# Patient Record
Sex: Male | Born: 1935
Health system: Southern US, Community
[De-identification: ages and names within clinical notes are randomized; demographics above are authoritative.]

## PROBLEM LIST (undated history)

## (undated) DIAGNOSIS — G20 Parkinson's disease: Secondary | ICD-10-CM

## (undated) HISTORY — PX: OTHER SURGICAL HISTORY: SHX169

## (undated) HISTORY — PX: THYROID SURGERY: SHX805

## (undated) HISTORY — PX: HERNIA REPAIR: SHX51

## (undated) HISTORY — DX: Parkinson's disease: G20

---

## 2000-09-25 ENCOUNTER — Ambulatory Visit (HOSPITAL_COMMUNITY): Admission: RE | Admit: 2000-09-25 | Discharge: 2000-09-25 | Payer: Self-pay | Admitting: Surgery

## 2001-12-25 ENCOUNTER — Emergency Department (HOSPITAL_COMMUNITY): Admission: EM | Admit: 2001-12-25 | Discharge: 2001-12-25 | Payer: Self-pay | Admitting: Emergency Medicine

## 2001-12-25 ENCOUNTER — Encounter: Payer: Self-pay | Admitting: Emergency Medicine

## 2003-09-26 ENCOUNTER — Ambulatory Visit (HOSPITAL_COMMUNITY): Admission: RE | Admit: 2003-09-26 | Discharge: 2003-09-26 | Payer: Self-pay | Admitting: Gastroenterology

## 2005-03-08 ENCOUNTER — Encounter: Admission: RE | Admit: 2005-03-08 | Discharge: 2005-03-08 | Payer: Self-pay | Admitting: Internal Medicine

## 2010-05-03 ENCOUNTER — Ambulatory Visit (HOSPITAL_COMMUNITY)
Admission: RE | Admit: 2010-05-03 | Discharge: 2010-05-03 | Payer: Self-pay | Source: Home / Self Care | Attending: Surgery | Admitting: Surgery

## 2010-07-23 LAB — CBC
HCT: 45 % (ref 39.0–52.0)
Hemoglobin: 15.2 g/dL (ref 13.0–17.0)
MCH: 31 pg (ref 26.0–34.0)
MCHC: 33.8 g/dL (ref 30.0–36.0)
MCV: 91.8 fL (ref 78.0–100.0)
Platelets: 202 10*3/uL (ref 150–400)
RBC: 4.9 MIL/uL (ref 4.22–5.81)
RDW: 13.4 % (ref 11.5–15.5)
WBC: 6.2 10*3/uL (ref 4.0–10.5)

## 2010-07-23 LAB — SURGICAL PCR SCREEN
MRSA, PCR: NEGATIVE
Staphylococcus aureus: NEGATIVE

## 2010-09-28 NOTE — Op Note (Signed)
NAME:  JUDITH, CAMPILLO NO.:  [id number]   MEDICAL RECORD NO.:  [id number]                   PATIENT TYPE:  AMB   LOCATION:  ENDO                                 FACILITY:  [facility]   PHYSICIAN:  Anselmo Rod, M.D.               DATE OF BIRTH:  [birth year]   DATE OF PROCEDURE:  09/26/2003  DATE OF DISCHARGE:                                 OPERATIVE REPORT   PROCEDURE PERFORMED:  Screening colonoscopy.   ENDOSCOPIST:  Charna Elizabeth, M.D.   INSTRUMENT USED:  Olympus video colonoscope.   INDICATIONS FOR PROCEDURE:  The patient is a 75 year old white male  undergoing screening colonoscopy to rule out colonic polyps, masses, etc.   PREPROCEDURE PREPARATION:  Informed consent was procured from the patient.  The patient was fasted for eight hours prior to the procedure and prepped  with a bottle of magnesium citrate and a gallon of GoLYTELY the night prior  to the procedure.   PREPROCEDURE PHYSICAL:  The patient had stable vital signs.  Neck supple.  Chest clear to auscultation.  S1 and S2 regular.  Abdomen soft with normal  bowel sounds.   DESCRIPTION OF PROCEDURE:  The patient was placed in left lateral decubitus  position and sedated with 75 mg of Demerol and 7.5 mg of Versed  intravenously.  Once the patient was adequately sedated and maintained on  low flow oxygen and continuous cardiac monitoring, the Olympus video  colonoscope was advanced from the rectum to the cecum.  There were few  sigmoid diverticula seen.  There were small internal hemorrhoids appreciated  on retroflexion in the rectum.  There was some residual stool in the right  colon.  Multiple washes were done.  The patient's position was changed from  the left lateral to the supine position with gentle application of abdominal  pressure to reach the cecum.  No masses or polyps were identified.  Small  lesions could have been missed secondary to a relatively inadequate prep;  however, I  made a good attempt to wash all the stool off to facilitate  adequate visualization.   IMPRESSION:  1. Small nonbleeding internal hemorrhoids.  2. Few early sigmoid diverticula.  3. Otherwise unrevealing colonoscopy.  Some residual stool in the right     colon, small lesions could have been missed.  r   RECOMMENDATIONS:  1. Continue a high fiber diet with liberal fluid intake.  2. Brochures on diverticulosis have been given to the patient for his     education.  3. Repeat colorectal cancer screening is recommended in the next 5 years     considering the fact that he had some residual stool in the colon at this     time.  Further recommendation will be made in follow-up.  Anselmo Rod, M.D.    JNM/MEDQ  D:  09/26/2003  T:  09/26/2003  Job:  [id number]   cc:   Olene Craven, M.D.  [address]  [address]  [place]  [place] [address]  Fax: [phone]

## 2010-09-28 NOTE — Op Note (Signed)
[facility]  Patient:    Lee Martinez, Lee Martinez                         MRN: [id number] Proc. Date: 09/25/00 Adm. Date:  [id number] Attending:  Abigail Miyamoto A                           Operative Report  PREOPERATIVE DIAGNOSIS:  Left inguinal hernia.  POSTOPERATIVE DIAGNOSIS:  Left inguinal hernia.  PROCEDURE:  Laparoscopic left inguinal hernia repair with mesh.  SURGEON:  Abigail Miyamoto, M.D.  ANESTHESIA:  General endotracheal.  ESTIMATED BLOOD LOSS:  Minimal.  DESCRIPTION OF PROCEDURE:  The patient was brought to the operating room and identified as Lee Martinez.  He was placed supine on the operating table, and general anesthesia was induced.  His abdomen was then prepped and draped in the usual sterile fashion.  Using the #15 blade, a small, transverse incision was made below the umbilicus.  Incision was carried down to the fascia.  The fascia was then opened just off the midline with the scalpel.  The rectus muscles were identified and elevated.  The dissecting balloon was then passed underneath the rectus muscles down toward the pubis.  The dissecting balloon was then insufflated, dissecting out the preperitoneal space.  Next, the dissecting balloon was removed, and the smaller balloon origin port was placed through the opening.  Insufflation of the preperitoneal space was then initiated.  Next two 5 mm ports were placed in the patient midline under direct vision.  Dissection was then carried out in the left inguinal area. The testicle, cord, and structures were easily identified as well as the indirect hernia sac which was easily reduced from the cords.  Coopers ligament was easily identified.  Next, a piece of 6 x 6 Prolene mesh was brought onto the field and fashioned appropriately.  The mesh was placed through the umbilical port and then opened up into the preperitoneal space and placed in the left inguinal floor as an onlay technique, covering  the cord and its structures.  The mesh was then tacked in place to Coopers ligament and up the medial abdominal wall and out laterally.  The mesh appeared to cover the defect well.  At this point, all points were removed, and the preperitoneal space was deflated.  Again, the mesh appeared to lay in place well as the space was collapsed.  Next, the fascia at the umbilical incision was closed with a figure-of-eight 0 Vicryl suture.  All incisions were then anesthetized with 0.25% Marcaine and closed with 4-0 Monocryl subcuticular sutures. Steri-Strips, gauze, and tape were then applied.  The patient tolerated the procedure well.  All sponge, needle and instrument counts were correct at the end of the procedure.  The patient was then extubated in the operating room and taken in stable condition to the recovery room. DD:  09/25/00 TD:  09/25/00 Job: [id number]

## 2011-07-12 DIAGNOSIS — G20A1 Parkinson's disease without dyskinesia, without mention of fluctuations: Secondary | ICD-10-CM

## 2011-07-12 DIAGNOSIS — G20 Parkinson's disease: Secondary | ICD-10-CM

## 2011-07-12 HISTORY — DX: Parkinson's disease: G20

## 2011-07-12 HISTORY — DX: Parkinson's disease without dyskinesia, without mention of fluctuations: G20.A1

## 2012-01-28 ENCOUNTER — Ambulatory Visit (INDEPENDENT_AMBULATORY_CARE_PROVIDER_SITE_OTHER): Payer: Medicare Other | Admitting: Family Medicine

## 2012-01-28 VITALS — BP 104/62 | HR 75 | Temp 98.4°F | Resp 16 | Ht 71.0 in | Wt 158.0 lb

## 2012-01-28 DIAGNOSIS — G20A1 Parkinson's disease without dyskinesia, without mention of fluctuations: Secondary | ICD-10-CM

## 2012-01-28 DIAGNOSIS — G20 Parkinson's disease: Secondary | ICD-10-CM

## 2012-01-28 DIAGNOSIS — H6120 Impacted cerumen, unspecified ear: Secondary | ICD-10-CM

## 2012-01-28 DIAGNOSIS — G4700 Insomnia, unspecified: Secondary | ICD-10-CM

## 2012-01-28 DIAGNOSIS — F341 Dysthymic disorder: Secondary | ICD-10-CM

## 2012-01-28 DIAGNOSIS — F418 Other specified anxiety disorders: Secondary | ICD-10-CM

## 2012-01-28 MED ORDER — MIRTAZAPINE 7.5 MG PO TABS: 7.5000 mg | ORAL_TABLET | Freq: Every day | ORAL | Status: DC

## 2012-01-28 NOTE — Patient Instructions (Addendum)
1. Depression with anxiety  mirtazapine (REMERON) 7.5 MG tablet  2. Insomnia  mirtazapine (REMERON) 7.5 MG tablet  3. Parkinson disease  mirtazapine (REMERON) 7.5 MG tablet  4. Cerumen impaction      FOLLOW-UP IN 3-4 WEEKS.

## 2012-01-28 NOTE — Progress Notes (Signed)
[address]   [place], [place]  [address]   [phone] Subjective:    Patient ID: Lee Martinez, male    DOB: Oct 03, 1935, 76 y.o.   MRN: [id number]  HPI This 76 y.o. male presents for evaluation of the following:  1.  Anxiety:  Onset 1-2 weeks ago.  This week wife in hospital with TIA x 2.  Wife now home.  Feels restless and anxious.  No similar symptoms in past.  Unknown trigger. Diagnosed with Parkinson's eight months ago by neurology.  Unable to sleep well; duration 4 months.  Only takes one medication for Parkinson's disease; no recent increase in medication.  Drinks sodas for caffeine; drinks one per day.  Sister lives with patient; she takes anxiety medication; cannot remember medication that sister takes; sister has suffered with anxiety for past year; sister has been experiencing depression and anxiety.  Very stable now on medication.  Sees psychiatrist sister does.  No SI/HI.  Sleeping 4-5 hours per night.  Mind is racing at night.  THinks sad and down. Very negative. No motivation; must force self to do things.  No interest; +indifferent.   Wife takes Sertraline of wife's this morning.   Sadness in past 2 months.  No triggering events.  Followed by neurology every six months; [place] [facility] of [facility].  Decreased memory.    2. Cerumen Impaction:  L ear clogging; normal hearing; no pain.  Feels stopped up.     PCP:  [facility]/Kimberly Renae Gloss s/p recent Wellness Exam.  Review of Systems  Constitutional: Negative for fever and chills.  Neurological: Positive for tremors. Negative for dizziness and light-headedness.  Psychiatric/Behavioral: Positive for disturbed wake/sleep cycle, dysphoric mood and decreased concentration. Negative for suicidal ideas, hallucinations, behavioral problems, confusion, self-injury and agitation. The patient is nervous/anxious.     Past Medical History  Diagnosis Date  . Parkinson's disease 07/2011    followed by [facility]     No past surgical history on file.  Prior to Admission medications   Medication Sig Start Date End Date Taking? Authorizing Provider  carbidopa-levodopa (SINEMET IR) 25-100 MG per tablet Take 1 tablet by mouth 3 (three) times daily.   Yes Historical Provider, MD    No Known Allergies  History   Social History  . Marital Status: Married    Spouse Name: N/A    Number of Children: N/A  . Years of Education: N/A   Occupational History  . Not on file.   Social History Main Topics  . Smoking status: Never Smoker   . Smokeless tobacco: Not on file  . Alcohol Use: Not on file  . Drug Use: Not on file  . Sexually Active: Not on file   Other Topics Concern  . Not on file   Social History Narrative  . No narrative on file    No family history on file.     Objective:   Physical Exam  Nursing note and vitals reviewed. Constitutional: He is oriented to person, place, and time. He appears well-developed and well-nourished.  HENT:  Head: Normocephalic and atraumatic.  Right Ear: External ear normal.  Nose: Nose normal.  Mouth/Throat: Oropharynx is clear and moist.       L EAR CANAL NARROW WITH MODERATE AMOUNT OF CERUMEN PRESENT.  Eyes: Conjunctivae normal are normal. Pupils are equal, round, and reactive to light.  Neck: Normal range of motion. Neck supple. No thyromegaly present.  Cardiovascular: Normal rate, regular rhythm, normal heart sounds  and intact distal pulses.   Pulmonary/Chest: Effort normal and breath sounds normal.  Lymphadenopathy:    He has no cervical adenopathy.  Neurological: He is alert and oriented to person, place, and time.       RESTING AND INTENTION TREMOR LUE.  Skin: Skin is warm and dry.  Psychiatric: He has a normal mood and affect. His behavior is normal. Judgment and thought content normal.       FLAT AFFECT.    PROCEDURE NOTE: L EAR IRRIGATED.  COLACE PLACED IN L EAR CANAL. REPEAT IRRIGATION.  PT TOLERATED PROCEDURE WELL.    Assessment  & Plan:   1. Depression with anxiety  mirtazapine (REMERON) 7.5 MG tablet  2. Insomnia  mirtazapine (REMERON) 7.5 MG tablet  3. Parkinson disease  mirtazapine (REMERON) 7.5 MG tablet  4. Cerumen impaction       1.  Depression with Anxiety: New.  Onset three months ago.  Rx for Remeron 7.5mg  qhs.  Follow-up in 4 weeks. 2.  Insomnia:  New.  Associated with anxiety with depression. Rx for Remeron provided. 3.  Parkinson's Disease: Newly diagnosed in past eight months by neurology.   4.  Cerumen Impaction L:  S/p irrigation in office; pt tolerated procedure well.

## 2012-01-29 NOTE — Progress Notes (Signed)
Reviewed and agree.

## 2012-02-04 NOTE — Progress Notes (Signed)
Appt made for 02/25/12 with Dr. Katrinka Blazing.

## 2012-02-04 NOTE — Progress Notes (Signed)
Left msg for pt to call to schedule 1 month f-up with Dr. Smith. 

## 2012-02-25 ENCOUNTER — Ambulatory Visit (INDEPENDENT_AMBULATORY_CARE_PROVIDER_SITE_OTHER): Payer: Medicare Other | Admitting: Family Medicine

## 2012-02-25 ENCOUNTER — Encounter: Payer: Self-pay | Admitting: Family Medicine

## 2012-02-25 VITALS — BP 114/70 | HR 82 | Temp 97.9°F | Resp 16 | Ht 71.0 in | Wt 161.0 lb

## 2012-02-25 DIAGNOSIS — F341 Dysthymic disorder: Secondary | ICD-10-CM

## 2012-02-25 DIAGNOSIS — F329 Major depressive disorder, single episode, unspecified: Secondary | ICD-10-CM

## 2012-02-25 DIAGNOSIS — G20 Parkinson's disease: Secondary | ICD-10-CM

## 2012-02-25 NOTE — Patient Instructions (Addendum)
1. Anxiety and depression

## 2012-02-25 NOTE — Progress Notes (Signed)
[address]   [place], [place]  [address]   [phone]  Subjective:    Patient ID: Lee Martinez, male    DOB: [birth year], 76 y.o.   MRN: [id number]  HPI This 76 y.o. male presents for follow-up depression/anxiety.  Anxiety much better.  Sleeping well.  Does not make sleepy but not drowsy.  Does suffer with mild dizziness; notices dizziness upon standing; not sure if worsens.  Appetite is good.  Enjoying eating.  No crying; never crying. 75% improved.  Wife still recovering from acute CVA; adjusting to new life.  Wife takes a while to verbalize. Taking over a lot of responsibilities.  +Food planning.  Married x 50 years.    2. Parkinson's disease:  Follow-up in January 2013 with neurology.  3.  BPH: taking Saw Palmetto.  Just added bladder medicine; [facility].    4.  Flu vaccines: never.   Review of Systems  Constitutional: Negative for fever, chills, diaphoresis, activity change, appetite change and fatigue.  Neurological: Positive for dizziness and light-headedness. Negative for syncope, facial asymmetry, speech difficulty, weakness, numbness and headaches.  Psychiatric/Behavioral: Positive for dysphoric mood. Negative for suicidal ideas, hallucinations, sleep disturbance and self-injury. The patient is nervous/anxious. The patient is not hyperactive.         Past Medical History  Diagnosis Date  . Parkinson's disease 07/2011    followed by [facility]    No past surgical history on file.  Prior to Admission medications   Medication Sig Start Date End Date Taking? Authorizing Provider  carbidopa-levodopa (SINEMET IR) 25-100 MG per tablet Take 1 tablet by mouth 3 (three) times daily.   Yes Historical Provider, MD  cholecalciferol (VITAMIN D) 1000 UNITS tablet Take 1,000 Units by mouth daily.   Yes Historical Provider, MD  fish oil-omega-3 fatty acids 1000 MG capsule Take 2 g by mouth daily.   Yes Historical Provider, MD  mirtazapine (REMERON) 7.5 MG  tablet Take 1 tablet (7.5 mg total) by mouth at bedtime. 01/28/12  Yes Ethelda Chick, MD  Multiple Vitamin (MULTIVITAMIN) tablet Take 1 tablet by mouth daily.   Yes Historical Provider, MD  saw palmetto 160 MG capsule Take 160 mg by mouth 2 (two) times daily.   Yes Historical Provider, MD    No Known Allergies  History   Social History  . Marital Status: Married    Spouse Name: N/A    Number of Children: N/A  . Years of Education: N/A   Occupational History  . Not on file.   Social History Main Topics  . Smoking status: Never Smoker   . Smokeless tobacco: Not on file  . Alcohol Use: Not on file  . Drug Use: Not on file  . Sexually Active: Not on file   Other Topics Concern  . Not on file   Social History Narrative  . No narrative on file    No family history on file.  Objective:   Physical Exam  Nursing note and vitals reviewed. Constitutional: He is oriented to person, place, and time. He appears well-developed and well-nourished. No distress.  HENT:  Head: Normocephalic and atraumatic.  Eyes: Conjunctivae normal and EOM are normal. Pupils are equal, round, and reactive to light.  Neck: Normal range of motion. Neck supple. No JVD present. No thyromegaly present.  Cardiovascular: Normal rate, regular rhythm and normal heart sounds.   Pulmonary/Chest: Effort normal and breath sounds normal. He has no wheezes. He has no rales.  Lymphadenopathy:    He has no cervical adenopathy.  Neurological: He is alert and oriented to person, place, and time. No cranial nerve deficit. He exhibits normal muscle tone. Coordination normal.  Skin: He is not diaphoretic.  Psychiatric: He has a normal mood and affect. His behavior is normal. Judgment and thought content normal.       Assessment & Plan:   1. Anxiety and depression   2. Parkinson's disease      1. Anxiety and Depression:  75% improved with Remeron; continue current dose at this time; recommend continued Remeron for  6-12 months; recommend follow-up with PCP.  Tolerating Remeron with minimal side effects. 2.  Parkinson's disease: stable; managed by neurology.

## 2012-05-08 NOTE — Progress Notes (Signed)
Reviewed and agree.

## 2012-08-04 IMAGING — CR DG CHEST 2V
2 series · 2 of 2 positions shown · non-contrast
Comparison: 03/08/2005

CLINICAL DATA: Bilateral inguinal hernias.  Preop.

CHEST - 2 VIEW

[view not recorded (1 of 2)]
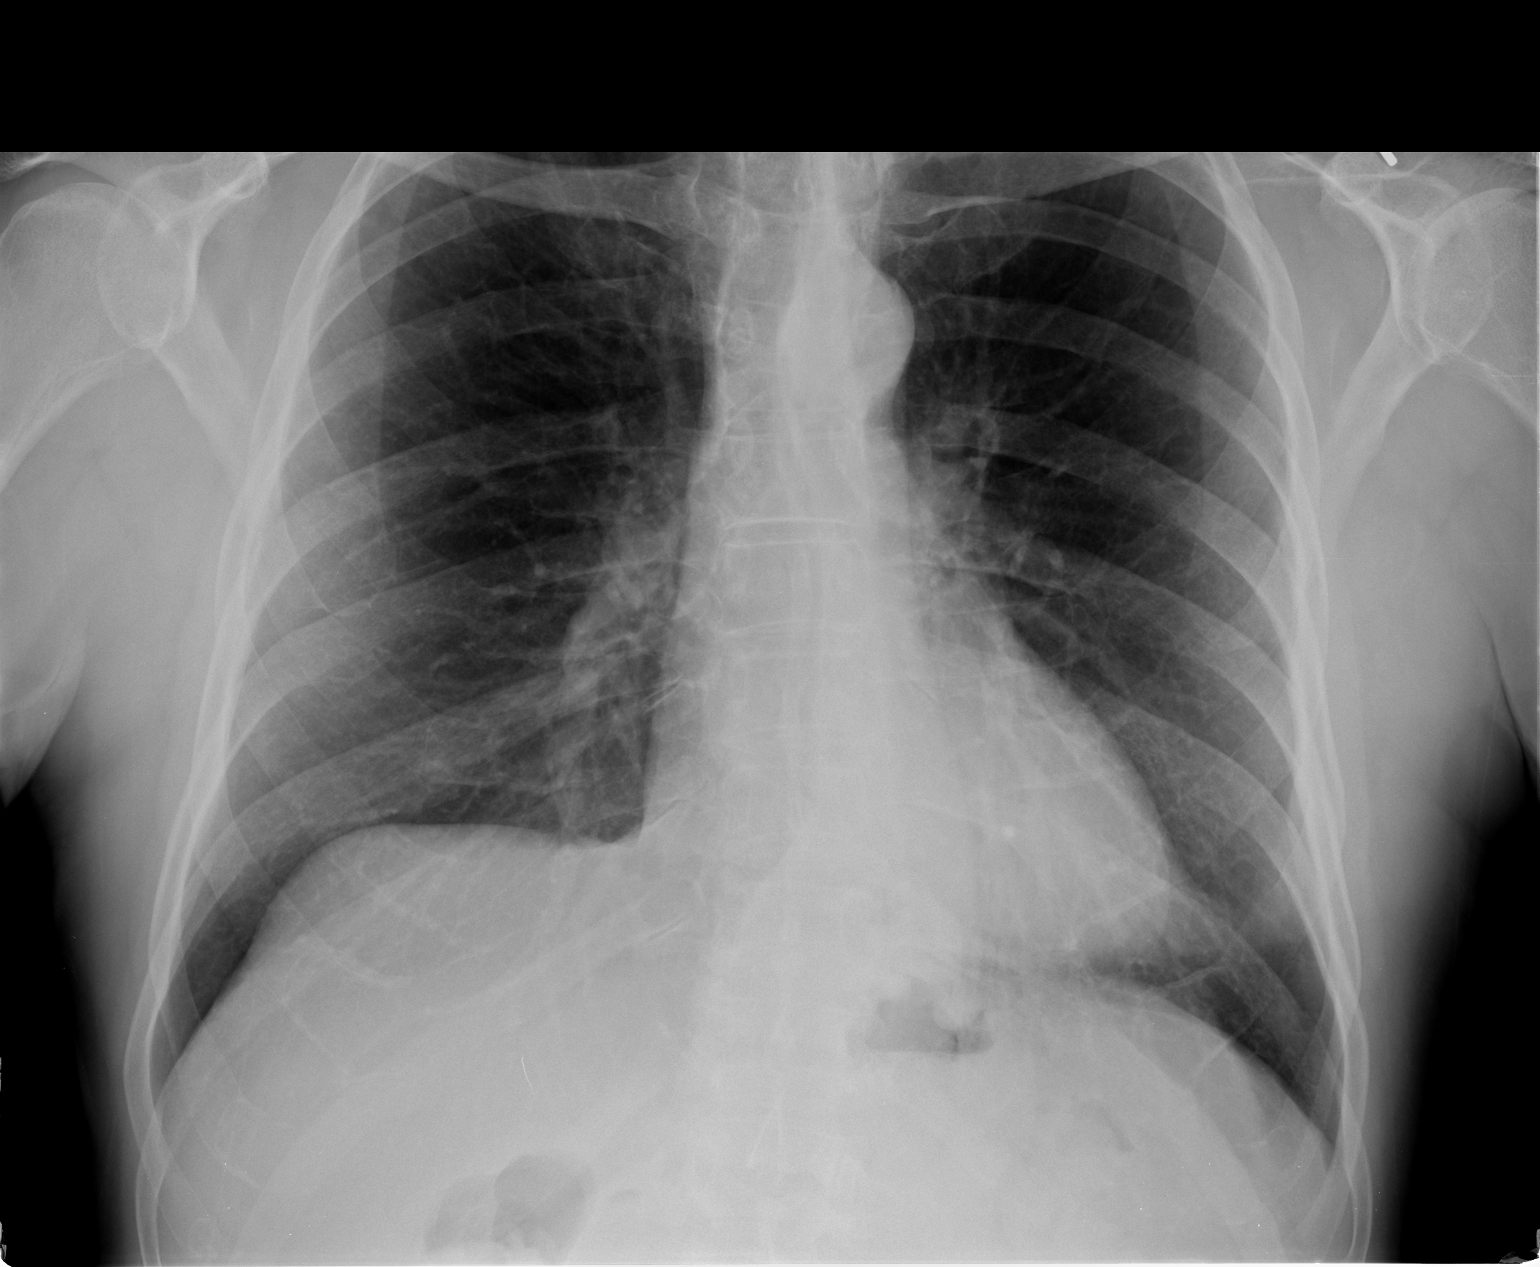

[view not recorded (2 of 2)]
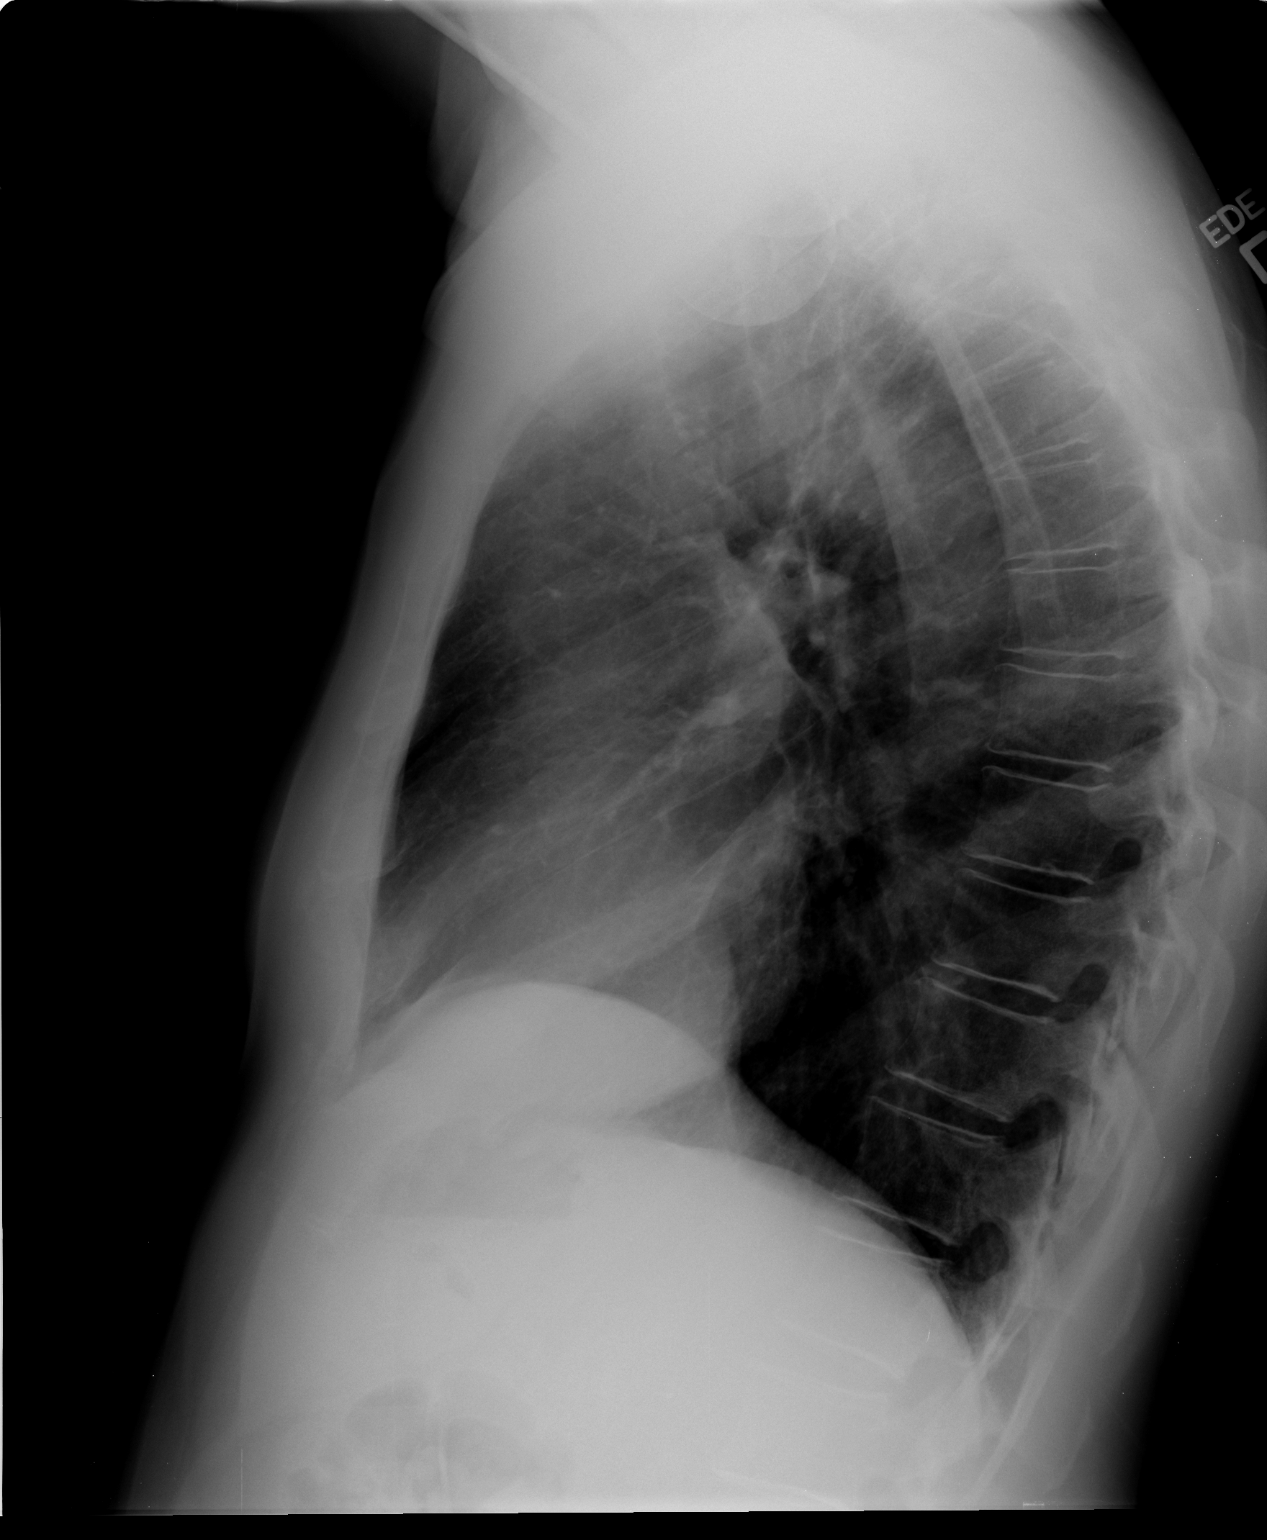

[2 of 2 positions shown; findings below may reference images not displayed]

FINDINGS: Heart and mediastinal contours are within normal limits.
No focal opacities or effusions.  No acute bony abnormality.
IMPRESSION: No active disease.

## 2012-08-17 ENCOUNTER — Other Ambulatory Visit: Payer: Self-pay | Admitting: Diagnostic Neuroimaging

## 2013-01-19 ENCOUNTER — Ambulatory Visit (INDEPENDENT_AMBULATORY_CARE_PROVIDER_SITE_OTHER): Payer: Medicare Other | Admitting: Diagnostic Neuroimaging

## 2013-01-19 ENCOUNTER — Encounter: Payer: Self-pay | Admitting: Diagnostic Neuroimaging

## 2013-01-19 VITALS — BP 107/72 | HR 90 | Temp 98.6°F | Ht 71.25 in | Wt 162.0 lb

## 2013-01-19 DIAGNOSIS — G20 Parkinson's disease: Secondary | ICD-10-CM

## 2013-01-19 DIAGNOSIS — G20A1 Parkinson's disease without dyskinesia, without mention of fluctuations: Secondary | ICD-10-CM | POA: Insufficient documentation

## 2013-01-19 MED ORDER — CARBIDOPA-LEVODOPA 25-100 MG PO TABS: 1.0000 | ORAL_TABLET | Freq: Three times a day (TID) | ORAL | Status: DC

## 2013-01-19 NOTE — Patient Instructions (Signed)
Continue current medications.   I will refer you to psychiatry for mgmt of anxiety and depression.

## 2013-01-19 NOTE — Progress Notes (Signed)
[facility]  PATIENT: Lee Martinez DOB: 1935/11/15  REFERRING CLINICIAN:  HISTORY FROM: patient and wife REASON FOR VISIT: follow up   HISTORICAL  CHIEF COMPLAINT:  Chief Complaint  Patient presents with  . parkinson disease    HISTORY OF PRESENT ILLNESS:   UPDATE 01/19/13: Since last visit, patient did not followup. He's not sure what happened. Patient's main complaint today is anxiety and sleep problems. He has been taking mirtazapine for several months without relief. In the past he did not express interest in SSRI or other medication. Now he is more amenable to these medications. He's never seen psychiatry.  Regarding tremor, stiffness and slowness of muscles, he feels the symptoms are stable. Carbidopa/levodopa seems to help a little bit. His wife has not noticed much difference. Patient did not go up on carbidopa/levodopa dosing last time.  UPDATE 12/04/11: Doing about the same. Not much benefit on carb/levo, although has only been on 1 tab TID x 1.5 weeks. No side effects. Asking about acupuncture. Having some anxiety and sleep problems. Not interested in SSRI or other meds now.  PRIOR HPI: 77 year old right-handed male with history of gastric ulcer, here for evaluation of tremor.  Patient reports tremor in the left upper extremity starting 3 years ago. Now tremor affecting his right upper extremity and legs. Denies voice or chin tremor. Tremor mainly affects him when he has stressed or tired. He describes a primarily resting tremor which improves with action. Patient denies dizziness but has mild uneasiness with walking. No change in sense of smell or taste. No reports of acting out dreams or REM behavior disorder symptoms. He has developed mild anxiety over the past one year. No constipation.  Several weeks ago patient saw a homeopathic doctor who evaluated this tremor problem and diagnosed him with a "ganglion problem" likely due to "parasites". He was not  prescribed any medications.  REVIEW OF SYSTEMS: Full 14 system review of systems performed and notable only for depression, anxiety, disinterest in activities, racing thoughts.  ALLERGIES: No Known Allergies  HOME MEDICATIONS: Prior to Admission medications   Medication Sig Start Date End Date Taking? Authorizing Provider  carbidopa-levodopa (SINEMET IR) 25-100 MG per tablet take 1 tablet by mouth three times a day 30 MINUTES before meals NEED TO SCHEDULE APPOINTMENT 08/17/12  Yes Suanne Marker, MD  cholecalciferol (VITAMIN D) 1000 UNITS tablet Take 1,000 Units by mouth daily.   Yes Historical Provider, MD  fish oil-omega-3 fatty acids 1000 MG capsule Take 2 g by mouth daily.   Yes Historical Provider, MD  mirtazapine (REMERON) 7.5 MG tablet Take 1 tablet (7.5 mg total) by mouth at bedtime. 01/28/12  Yes Ethelda Chick, MD  Multiple Vitamin (MULTIVITAMIN) tablet Take 1 tablet by mouth daily.   Yes Historical Provider, MD  saw palmetto 160 MG capsule Take 160 mg by mouth 2 (two) times daily.   Yes Historical Provider, MD   PAST MEDICAL HISTORY: Past Medical History  Diagnosis Date  . Parkinson's disease 07/2011    followed by [facility]    PAST SURGICAL HISTORY: Past Surgical History  Procedure Laterality Date  . Thyroid surgery    . Hernia repair      FAMILY HISTORY: Family History  Problem Relation Age of Onset  . Heart failure Mother     SOCIAL HISTORY:  History   Social History  . Marital Status: Married    Spouse Name: serena    Number of Children: 0  .  Years of Education: 12   Occupational History  . retired    Social History Main Topics  . Smoking status: Never Smoker   . Smokeless tobacco: Not on file  . Alcohol Use: Yes  . Drug Use: No  . Sexual Activity: Yes    Partners: Female   Other Topics Concern  . Not on file   Social History Narrative  . No narrative on file     PHYSICAL EXAM  Filed Vitals:   01/19/13 0956  BP: 107/72    Pulse: 90  Temp: 98.6 F (37 C)  TempSrc: Oral  Height: 5' 11.25" (1.81 m)  Weight: 162 lb (73.483 kg)    Not recorded    Body mass index is 22.43 kg/(m^2).  GENERAL EXAM: General: Patient is awake, alert and in no acute distress.  Well developed and groomed. Neck: Neck is supple. Cardiovascular: No carotid artery bruits.  Heart is regular rate and rhythm with no murmurs.  Neurologic Exam  Mental Status: Awake, alert. Language is fluent and comprehension intact. POSITIVE MYERSONS. MASKED FACIES. Cranial Nerves: Pupils are equal and reactive to light.  Visual fields are full to confrontation.  Conjugate eye movements are full and symmetric.  Facial sensation and strength are symmetric.  Hearing is intact.  Palate elevated symmetrically and uvula is midline.  Shoulder shrug is symmetric.  Tongue is midline. Motor: RESTING TREMOR (LUE > RUE > BLE). Normal bulk and MILD COGWHEELING IN LUE. BRADYKINESIA IN LUE AND BLE. Full strength in the upper and lower extremities.  No pronator drift. Sensory: Intact and symmetric to light touch, pinprick, temperature, vibration. Coordination: No ataxia or dysmetria on finger-nose or rapid alternating movement testing. Gait and Station: STOOPED POSTURE. LUE TREMOR WITH WALKING. POOR ARM SWING. Narrow based gait, able to walk on heels and toes.  Tandem gait is UNSTEADY. Romberg is negative. Reflexes: Deep tendon reflexes in the upper and lower extremity are present and symmetric.   DIAGNOSTIC DATA (LABS, IMAGING, TESTING) - I reviewed patient records, labs, notes, testing and imaging myself where available.  Lab Results  Component Value Date   WBC 6.2 05/02/2010   HGB 15.2 05/02/2010   HCT 45.0 05/02/2010   MCV 91.8 05/02/2010   PLT 202 05/02/2010   No results found for this basename: na,  k,  cl,  co2,  glucose,  bun,  creatinine,  calcium,  prot,  albumin,  ast,  alt,  alkphos,  bilitot,  gfrnonaa,  gfraa   No results found for this basename:  CHOL,  HDL,  LDLCALC,  LDLDIRECT,  TRIG,  CHOLHDL   No results found for this basename: HGBA1C   No results found for this basename: VITAMINB12   No results found for this basename: TSH    11/04/11 MRI brain - 1. Mild periventricular gliosis. Few punctate non-specific subcortical foci of gliosis. May represent chronic small vessel ischemic disease. 2. No acute findings.   ASSESSMENT AND PLAN  77 y.o. male with progressive resting tremor left greater than right upper extremities, bilateral lower extremities, with cognitive rigidity, bradykinesia, stooped posture and Myerson sign.   Dx: idiopathic Parkinson's disease.   PLAN: 1. continue carbidopa-levodopa 1 tab TID 2. Need to address depression and anxiety first, because this has been a barrier to his treatment of parkinson's; i will refer him to psychiatry   Orders Placed This Encounter  Procedures  . Ambulatory referral to Psychiatry   Return in about 3 months (around 04/20/2013) for with Heide Guile or  Elva Mauro.    Suanne Marker, MD 01/19/2013, 11:13 AM Certified in Neurology, Neurophysiology and Neuroimaging  [facility] [address] [place], [place] [address] [phone]

## 2013-01-29 ENCOUNTER — Telehealth: Payer: Self-pay | Admitting: Diagnostic Neuroimaging

## 2013-02-05 NOTE — Telephone Encounter (Signed)
Spoke to woman that answered. Advised me to call back for patient after 2pm. Will provide different psychiatrist info.

## 2013-03-31 ENCOUNTER — Ambulatory Visit (INDEPENDENT_AMBULATORY_CARE_PROVIDER_SITE_OTHER): Payer: 59 | Admitting: Psychiatry

## 2013-03-31 ENCOUNTER — Encounter (INDEPENDENT_AMBULATORY_CARE_PROVIDER_SITE_OTHER): Payer: Self-pay

## 2013-03-31 VITALS — BP 113/80 | HR 84 | Ht 71.75 in | Wt 165.0 lb

## 2013-03-31 DIAGNOSIS — F418 Other specified anxiety disorders: Secondary | ICD-10-CM

## 2013-03-31 DIAGNOSIS — F329 Major depressive disorder, single episode, unspecified: Secondary | ICD-10-CM

## 2013-03-31 DIAGNOSIS — G20 Parkinson's disease: Secondary | ICD-10-CM

## 2013-03-31 DIAGNOSIS — F0630 Mood disorder due to known physiological condition, unspecified: Secondary | ICD-10-CM | POA: Insufficient documentation

## 2013-03-31 DIAGNOSIS — G4700 Insomnia, unspecified: Secondary | ICD-10-CM

## 2013-03-31 MED ORDER — MIRTAZAPINE 30 MG PO TABS: 30.0000 mg | ORAL_TABLET | Freq: Every day | ORAL | Status: DC

## 2013-03-31 NOTE — Progress Notes (Signed)
Psychiatric Assessment Adult  Patient Identification:  Lee Martinez Date of Evaluation:  03/31/2013 Chief Complaint: Neurologist send me This patient is a 77 year old married male with no past psychiatric history. 2 years ago he was diagnosed with Parkinson's disorder. It began with a tremor that's became bilateral and the last year. The patient was put on Sinemet approximately one year ago. He claims his neurologist sent him because he thinks the Sinemet is producing psychiatric disturbances. The only psychiatric disturbance he has is that he has developed persistent dysphoria over the last 6-12 months. I suspect this is related directly to his Parkinson's. He says he's been having trouble sleeping but that is better with some over-the-counter herbs. The patient's appetite is stable albeit he's lost weight in the last year or so. The patient is able to think and concentrate but acknowledges a decrease in his cognitive performance reflected in a reduction in his ability to public speak. The patient denies worthlessness. The patient has never been suicidal and is not now. The patient enjoys TV and radio. The patient denies use of alcohol. The patient denies any psychotic symptoms. He denies ever having an episode of major depression nor mania. He denies any specific anxiety disorders of generalized anxiety disorder panic disorder or OCD. Approximately 6 months ago the patient was begun on very low dose Remeron. The patient responded to it and felt better. He felt less anxious. He then stopped it. Months later recently he began feeling dysphoric and anxious again. He restarted Remeron at 7.5 and even increase the dose. He is yet to feel benefit from the Remeron. 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 He speaks fluent [place]. The patient is financially stable and has had no deaths. Generally his health is good other than  his new diagnosis of Parkinson's. The patient has no past psychiatric history at all. His never been a psychiatrist his office and never been in a psychiatric hospital. The patient denies any physical complaints other than mild resting tremors in both hands. History of Chief Complaint:  No chief complaint on file.   HPI Review of Systems Physical Exam  Depressive Symptoms: depressed mood,  (Hypo) Manic Symptoms:   Elevated Mood:  No Irritable Mood:  No Grandiosity:  No Distractibility:  No Labiality of Mood:   Delusions:  No Hallucinations:  No Impulsivity:  No Sexually Inappropriate Behavior:  No Financial Extravagance:  No Flight of Ideas:  No  Anxiety Symptoms: Excessive Worry:  No Panic Symptoms:  No Agoraphobia:  No Obsessive Compulsive: No  Symptoms: None, Specific Phobias:  No Social Anxiety:  No  Psychotic Symptoms:  Hallucinations: No None Delusions:  No Paranoia:  No   Ideas of Reference:  No  PTSD Symptoms: Ever had a traumatic exposure:  No Had a traumatic exposure in the last month:  No Re-experiencing: No None Hypervigilance:  No Hyperarousal: No None Avoidance: No None  Traumatic Brain Injury: No   Past Psychiatric History: Diagnosis:   Hospitalizations:   Outpatient Care:   Substance Abuse Care:   Self-Mutilation:   Suicidal Attempts:   Violent Behaviors:    Past Medical History:   Past Medical History  Diagnosis Date  . Parkinson's disease 07/2011    followed by [facility]   History of Loss of Consciousness:   Seizure History:   Cardiac History:   Allergies:  No Known Allergies Current Medications:  Current Outpatient Prescriptions  Medication Sig Dispense Refill  . carbidopa-levodopa (SINEMET IR) 25-100 MG per tablet Take 1 tablet by mouth 3 (three) times daily.  90 tablet  12  . cholecalciferol (VITAMIN D) 1000 UNITS tablet Take 1,000 Units by mouth daily.      . fish oil-omega-3 fatty acids 1000 MG capsule Take 2 g by  mouth daily.      . mirtazapine (REMERON) 30 MG tablet Take 1 tablet (30 mg total) by mouth at bedtime.  30 tablet  5  . Multiple Vitamin (MULTIVITAMIN) tablet Take 1 tablet by mouth daily.      . saw palmetto 160 MG capsule Take 160 mg by mouth 2 (two) times daily.       No current facility-administered medications for this visit.    Previous Psychotropic Medications:  Medication Dose   remeron 7.5                   Medical Consequences of Substance Abuse:   Legal Consequences of Substance Abuse:   Family Consequences of Substance Abuse:   Blackouts:   DT's:   Withdrawal Symptoms:     Social History: Current Place of Residence: [place] of Birth:  Family Members:  Marital Status:  married Children:   Sons:   Daughters:  Relationships:  Education:   Educational Problems/Performance:  Religious Beliefs/Practices:  History of Abuse:  Teacher, music History:   Legal History:  Hobbies/Interests:   Family History:   Family History  Problem Relation Age of Onset  . Heart failure Mother     Mental Status Examination/Evaluation: Objective:  Appearance: Casual  Eye Contact::  Good  Speech:  Clear and Coherent  Volume:  Normal  Mood:   sad  Affect:  Appropriate  Thought Process:  Goal Directed  Orientation:  Full (Time, Place, and Person)  Thought Content:  WDL  Suicidal Thoughts:  No  Homicidal Thoughts:  No  Judgement:  Good  Insight:  Good  Psychomotor Activity:  Normal  Akathisia:  No  Handed:  Right  AIMS (if indicated):    Assets:  Desire for Improvement    Laboratory/X-Ray Psychological Evaluation(s)        Assessment:  Axis I: Major Depression, single episode  AXIS I Major Depression, single episode  AXIS II Deferred  AXIS III Past Medical History  Diagnosis Date  . Parkinson's disease 07/2011    followed by [facility]     AXIS IV problems with access to health care services  AXIS V 61-70 mild  symptoms   Treatment Plan/Recommendations:  Plan of Care: At this time we'll increase his Remeron 7.5 mg to 30 mg at night. We'll educate them on the issue that Parkinson's can induce mood state that he is experiencing. The patient return to see me in 7 weeks.   Laboratory:    Psychotherapy:   Medications:   Routine PRN Medications:    Consultations:   Safety Concerns:    Other:      Lucas Mallow, MD 11/19/20144:06 PM

## 2013-04-26 ENCOUNTER — Encounter: Payer: Self-pay | Admitting: Nurse Practitioner

## 2013-04-26 ENCOUNTER — Ambulatory Visit (INDEPENDENT_AMBULATORY_CARE_PROVIDER_SITE_OTHER): Payer: Medicare Other | Admitting: Nurse Practitioner

## 2013-04-26 VITALS — BP 108/67 | HR 89 | Temp 98.1°F | Ht 71.0 in | Wt 166.0 lb

## 2013-04-26 DIAGNOSIS — G20 Parkinson's disease: Secondary | ICD-10-CM

## 2013-04-26 DIAGNOSIS — G20A1 Parkinson's disease without dyskinesia, without mention of fluctuations: Secondary | ICD-10-CM

## 2013-04-26 NOTE — Patient Instructions (Signed)
PLAN:  1. continue carbidopa-levodopa 1 tab TID  2. Continue Remeron as managed by psychiatry  3. Follow up with Dr. Marjory Lies in 6 months.

## 2013-04-26 NOTE — Progress Notes (Signed)
PATIENT: Lee Martinez DOB: April 20, 1936   REASON FOR VISIT: routine follow up for Parkinson's Disease HISTORY FROM: patient  HISTORY OF PRESENT ILLNESS: UPDATE 04/26/13 (LL): Since last visit, patient met with psychiatrist and his Remeron was increased.  He reports that his mood and anxiety is better.  He is sleeping better with aid from [place] and [place].  Denies problems with Sinemet wearing off, constipation, urinary frequency, or balance problems.   UPDATE 01/19/13: Since last visit, patient did not followup. He's not sure what happened. Patient's main complaint today is anxiety and sleep problems. He has been taking mirtazapine for several months without relief. In the past he did not express interest in SSRI or other medication. Now he is more amenable to these medications. He's never seen psychiatry.  Regarding tremor, stiffness and slowness of muscles, he feels the symptoms are stable. Carbidopa/levodopa seems to help a little bit. His wife has not noticed much difference. Patient did not go up on carbidopa/levodopa dosing last time.   UPDATE 12/04/11: Doing about the same. Not much benefit on carb/levo, although has only been on 1 tab TID x 1.5 weeks. No side effects. Asking about acupuncture. Having some anxiety and sleep problems. Not interested in SSRI or other meds now.   PRIOR HPI: 78 year old right-handed male with history of gastric ulcer, here for evaluation of tremor.  Patient reports tremor in the left upper extremity starting 3 years ago. Now tremor affecting his right upper extremity and legs. Denies voice or chin tremor. Tremor mainly affects him when he has stressed or tired. He describes a primarily resting tremor which improves with action. Patient denies dizziness but has mild uneasiness with walking. No change in sense of smell or taste. No reports of acting out dreams or REM behavior disorder symptoms. He has developed mild anxiety over the past one year. No constipation.    Several weeks ago patient saw a homeopathic doctor who evaluated this tremor problem and diagnosed him with a "ganglion problem" likely due to "parasites". He was not prescribed any medications.   REVIEW OF SYSTEMS: Full 14 system review of systems performed and notable only for depression, anxiety, memory loss, tremors.  ALLERGIES: No Known Allergies  HOME MEDICATIONS: Outpatient Prescriptions Prior to Visit  Medication Sig Dispense Refill  . carbidopa-levodopa (SINEMET IR) 25-100 MG per tablet Take 1 tablet by mouth 3 (three) times daily.  90 tablet  12  . cholecalciferol (VITAMIN D) 1000 UNITS tablet Take 1,000 Units by mouth daily.      . fish oil-omega-3 fatty acids 1000 MG capsule Take 2 g by mouth daily.      . mirtazapine (REMERON) 30 MG tablet Take 1 tablet (30 mg total) by mouth at bedtime.  30 tablet  5  . Multiple Vitamin (MULTIVITAMIN) tablet Take 1 tablet by mouth daily.      . saw palmetto 160 MG capsule Take 160 mg by mouth 2 (two) times daily.       No facility-administered medications prior to visit.    PAST MEDICAL HISTORY: Past Medical History  Diagnosis Date  . Parkinson's disease 07/2011    followed by [facility]    PAST SURGICAL HISTORY: Past Surgical History  Procedure Laterality Date  . Thyroid surgery    . Hernia repair      FAMILY HISTORY: Family History  Problem Relation Age of Onset  . Heart failure Mother     SOCIAL HISTORY: History   Social History  .  Marital Status: Married    Spouse Name: serena    Number of Children: 0  . Years of Education: 12   Occupational History  . retired    Social History Main Topics  . Smoking status: Never Smoker   . Smokeless tobacco: Not on file  . Alcohol Use: Yes  . Drug Use: No  . Sexual Activity: Yes    Partners: Female   Other Topics Concern  . Not on file   Social History Narrative  . No narrative on file     PHYSICAL EXAM  Filed Vitals:   04/26/13 0856  BP: 108/67   Pulse: 89  Temp: 98.1 F (36.7 C)  TempSrc: Oral  Height: 5\' 11"  (1.803 m)  Weight: 166 lb (75.297 kg)   Body mass index is 23.16 kg/(m^2).  Generalized: Patient is awake, alert and in no acute distress. Well developed and groomed.  Head: normocephalic and atraumatic. Oropharynx benign  Neck: Supple, no carotid bruits  Cardiac: Regular rate rhythm, no murmur  Musculoskeletal: No deformity   Neurological examination  Mental Status: Awake, alert. Language is fluent and comprehension intact. POSITIVE MYERSONS. MASKED FACIES.  Cranial Nerves: Pupils are equal and reactive to light. Visual fields are full to confrontation. Conjugate eye movements are full and symmetric. Facial sensation and strength are symmetric. Hearing is intact. Palate elevated symmetrically and uvula is midline. Shoulder shrug is symmetric. Tongue is midline.  Motor: RESTING TREMOR (LUE > RUE > BLE). Normal bulk and MILD COGWHEELING IN LUE. BRADYKINESIA IN LUE AND BLE. Full strength in the upper and lower extremities. No pronator drift.  Sensory: Intact and symmetric to light touch, pinprick, temperature, vibration.  Coordination: No ataxia or dysmetria on finger-nose or rapid alternating movement testing.  Gait and Station: STOOPED POSTURE. LUE TREMOR WITH WALKING. POOR ARM SWING. Narrow based gait, able to walk on heels and toes. Tandem gait is UNSTEADY. Romberg is negative.  Reflexes: Deep tendon reflexes in the upper and lower extremity are present and symmetric.  DIAGNOSTIC DATA (LABS, IMAGING, TESTING) - I reviewed patient records, labs, notes, testing and imaging myself where available.  11/04/11 MRI brain -  1. Mild periventricular gliosis. Few punctate non-specific subcortical foci of gliosis. May represent chronic small vessel ischemic disease.  2. No acute findings.   ASSESSMENT AND PLAN 77 y.o. male with progressive resting tremor left greater than right upper extremities, bilateral lower extremities,  with cognitive rigidity, bradykinesia, stooped posture and Myerson sign.  Dx: idiopathic Parkinson's disease.   PLAN:  1. continue carbidopa-levodopa 1 tab TID  2. Continue Remeron as managed by psychiatry  3. Review Parkinson's Disease Info Packet 4. Follow up with Dr. Marjory Lies in 6 months.  Ronal Fear, MSN, NP-C 04/26/2013, 9:02 AM [facility] [address] [place], [place] [address] [phone]  Note: This document was prepared with digital dictation and possible smart phrase technology. Any transcriptional errors that result from this process are unintentional.

## 2013-06-11 ENCOUNTER — Ambulatory Visit (HOSPITAL_COMMUNITY): Payer: Self-pay | Admitting: Psychiatry

## 2013-06-17 ENCOUNTER — Ambulatory Visit (INDEPENDENT_AMBULATORY_CARE_PROVIDER_SITE_OTHER): Payer: 59 | Admitting: Psychiatry

## 2013-06-17 VITALS — BP 114/74 | HR 96 | Ht 71.0 in | Wt 167.8 lb

## 2013-06-17 DIAGNOSIS — F329 Major depressive disorder, single episode, unspecified: Secondary | ICD-10-CM | POA: Insufficient documentation

## 2013-06-17 DIAGNOSIS — F418 Other specified anxiety disorders: Secondary | ICD-10-CM

## 2013-06-17 DIAGNOSIS — G4700 Insomnia, unspecified: Secondary | ICD-10-CM

## 2013-06-17 DIAGNOSIS — G20 Parkinson's disease: Secondary | ICD-10-CM

## 2013-06-17 MED ORDER — LORAZEPAM 0.5 MG PO TABS: 0.5000 mg | ORAL_TABLET | Freq: Every morning | ORAL | Status: DC

## 2013-06-17 MED ORDER — MIRTAZAPINE 45 MG PO TABS: 45.0000 mg | ORAL_TABLET | Freq: Every day | ORAL | Status: DC

## 2013-06-17 NOTE — Progress Notes (Signed)
[facility] MD Progress Note  06/17/2013 4:34 PM Lee Martinez  MRN:  [id number] Subjective:  No better Today the patient is seen one time. They're very close valuation he describes an uncomfortable unnatural mood state. It's hard for him to describe it exactly as depression. The patient is sleeping well on some over-the-counter agents. His appetite is only fair. He's been able to maintain his weight. The patient has normal energy and can think and concentrate with little problems. He denies feeling worthless. He psychomotor functioning is normal. He enjoys reading watching TV and is active. He denies being suicidal. He denies being suspicious for somatic. He is no evidence of psychosis. He denies the use of alcohol. The patient has been taking his Remeron 30 mg regularly. He describes some mild anxiety that seems to occur in the morning. Diagnosis:   DSM5: Schizophrenia Disorders:   Obsessive-Compulsive Disorders:   Trauma-Stressor Disorders:   Substance/Addictive Disorders:   Depressive Disorders:  Major Depressive Disorder - Mild (296.21) Total Time spent with patient: 45 minutes  Axis I: Major Depression, single episode  ADL's:  Intact  Sleep: Good  Appetite:  Good  Suicidal Ideation:  no Homicidal Ideation:  none AEB (as evidenced by):  Psychiatric Specialty Exam: Physical Exam  ROS  Blood pressure 114/74, pulse 96, height 5\' 11"  (1.803 m), weight 167 lb 12.8 oz (76.114 kg).Body mass index is 23.41 kg/(m^2).  General Appearance: Casual  Eye Contact::  Good  Speech:  Clear and Coherent  Volume:  Normal  Mood:  Euthymic  Affect:  Congruent  Thought Process:  Coherent  Orientation:  Full (Time, Place, and Person)  Thought Content:  WDL  Suicidal Thoughts:  No  Homicidal Thoughts:  No  Memory:  NA  Judgement:  Good  Insight:  Good  Psychomotor Activity:  Normal  Concentration:  Good  Recall:  Good  Fund of Knowledge:Good  Language: Good  Akathisia:  No  Handed:  Right   AIMS (if indicated):     Assets:  Desire for Improvement  Sleep:      Musculoskeletal: Strength & Muscle Tone:  Gait & Station:  Patient leans:   Current Medications: Current Outpatient Prescriptions  Medication Sig Dispense Refill  . carbidopa-levodopa (SINEMET IR) 25-100 MG per tablet Take 1 tablet by mouth 3 (three) times daily.  90 tablet  12  . cholecalciferol (VITAMIN D) 1000 UNITS tablet Take 1,000 Units by mouth daily.      . fish oil-omega-3 fatty acids 1000 MG capsule Take 2 g by mouth daily.      Marland Kitchen GABAPENTIN, PHN, PO Take 190 mg by mouth at bedtime.      Marland Kitchen LORazepam (ATIVAN) 0.5 MG tablet Take 1 tablet (0.5 mg total) by mouth every morning. 1 qam  30 tablet  4  . mirtazapine (REMERON) 45 MG tablet Take 1 tablet (45 mg total) by mouth at bedtime.  30 tablet  5  . Multiple Vitamin (MULTIVITAMIN) tablet Take 1 tablet by mouth daily.      . saw palmetto 160 MG capsule Take 160 mg by mouth 2 (two) times daily.      Marland Kitchen VALERIAN PO Take 1 tablet by mouth at bedtime as needed and may repeat dose one time if needed.       No current facility-administered medications for this visit.    Lab Results: No results found for this or any previous visit (from the past 48 hour(s)).  Physical Findings: AIMS:  , ,  ,  ,  CIWA:    COWS:     Treatment Plan Summary: At this time our last effort will be to increase his Remeron to 45 mg and begin Ativan 0.5 mg in the morning. I shared with him that this would be about her last effort and that this wouldn't work I wouldn't change them lots of different antidepressants. In essence we are last maneuver. This patient she'll return to see me in 2 months.  Plan:  Medical Decision Making Problem Points:  Established problem, worsening (2) Data Points:  Review of medication regiment & side effects (2)  I certify that inpatient services furnished can reasonably be expected to improve the patient's condition.   Haskel Schroeder 06/17/2013,  4:34 PM

## 2013-08-18 ENCOUNTER — Ambulatory Visit (INDEPENDENT_AMBULATORY_CARE_PROVIDER_SITE_OTHER): Payer: 59 | Admitting: Psychiatry

## 2013-08-18 VITALS — BP 109/65 | HR 92 | Ht 71.0 in | Wt 168.2 lb

## 2013-08-18 DIAGNOSIS — G20 Parkinson's disease: Secondary | ICD-10-CM

## 2013-08-18 DIAGNOSIS — F332 Major depressive disorder, recurrent severe without psychotic features: Secondary | ICD-10-CM

## 2013-08-18 DIAGNOSIS — F418 Other specified anxiety disorders: Secondary | ICD-10-CM

## 2013-08-18 DIAGNOSIS — F339 Major depressive disorder, recurrent, unspecified: Secondary | ICD-10-CM | POA: Insufficient documentation

## 2013-08-18 DIAGNOSIS — G4700 Insomnia, unspecified: Secondary | ICD-10-CM

## 2013-08-18 MED ORDER — MIRTAZAPINE 45 MG PO TABS: 45.0000 mg | ORAL_TABLET | Freq: Every day | ORAL | Status: DC

## 2013-08-18 MED ORDER — LORAZEPAM 0.5 MG PO TABS: 0.5000 mg | ORAL_TABLET | Freq: Every morning | ORAL | Status: DC

## 2013-08-18 NOTE — Progress Notes (Signed)
[facility] MD Progress Note  08/18/2013 2:02 PM Lee Martinez  MRN:  [id number] Subjective:  Feeling better Today the patient says that his anxiety is less and that his mood is in fact improved. The patient had equivocal evidence of major depression. Today he denies being persistently depressed. He is sleeping and eating well. He's got fairly good energy. His problem is a complaint of a lack of motivation a lack of passion. The patient says he feels good about his sense of worth. He denies being suspicious and is not very somatic. He denies being suicidal. The patient is able still enjoys reading and is very [place] total. His Bible study and goes to church rarely. He enjoys a Programmer, multimedia garden that he is going to start up. He watches little TV. The patient's health is good. He sees Dr. Heath Gold. The patient is diagnosed with Parkinson's but at this time seems to be moving quite well. Today we agreed that the Remeron and Ativan in the morning probably definitely have a benefit. At this time he is agreed to continue taking these medicines and return to see me in 3 months. We reviewed the pros and cons of the medications and he agreed to take them as prescribed. The patient is not having any complaints of chest pain terms of breath or any other physical/neurological symptoms. I think he is doing very well. I distinctly think that he is improved since her last visit.  Diagnosis:   DSM5: Schizophrenia Disorders:   Obsessive-Compulsive Disorders:   Trauma-Stressor Disorders:   Substance/Addictive Disorders:   Depressive Disorders:  Major Depressive Disorder - Mild (296.21) Total Time spent with patient: 30 minutes  Axis I: Major Depression, Recurrent severe  ADL's:  Intact  Sleep: Good  Appetite:  Good  Suicidal Ideation:  no Homicidal Ideation:  none AEB (as evidenced by):  Psychiatric Specialty Exam: Physical Exam  ROS  Blood pressure 109/65, pulse 92, height 5\' 11"  (1.803 m), weight 168 lb 3.2 oz  (76.295 kg).Body mass index is 23.47 kg/(m^2).  General Appearance: Well Groomed  Engineer, water::  Good  Speech:  Clear and Coherent  Volume:  Normal  Mood:  Negative  Affect:  Appropriate  Thought Process:  Coherent  Orientation:  Full (Time, Place, and Person)  Thought Content:  WDL  Suicidal Thoughts:  No  Homicidal Thoughts:  No  Memory:  NA  Judgement:  Good  Insight:  Good  Psychomotor Activity:  Normal  Concentration:  Good  Recall:  Good  Fund of Knowledge:Good  Language: Good  Akathisia:  No  Handed:  Right  AIMS (if indicated):     Assets:  Desire for Improvement  Sleep:      Musculoskeletal: Strength & Muscle Tone: decreased Gait & Station: normal Patient leans: N/A  Current Medications: Current Outpatient Prescriptions  Medication Sig Dispense Refill  . carbidopa-levodopa (SINEMET IR) 25-100 MG per tablet Take 1 tablet by mouth 3 (three) times daily.  90 tablet  12  . cholecalciferol (VITAMIN D) 1000 UNITS tablet Take 1,000 Units by mouth daily.      . fish oil-omega-3 fatty acids 1000 MG capsule Take 2 g by mouth daily.      Marland Kitchen GABAPENTIN, PHN, PO Take 190 mg by mouth at bedtime.      Marland Kitchen LORazepam (ATIVAN) 0.5 MG tablet Take 1 tablet (0.5 mg total) by mouth every morning. 1 qam  30 tablet  5  . mirtazapine (REMERON) 45 MG tablet Take 1 tablet (45  mg total) by mouth at bedtime.  30 tablet  5  . Multiple Vitamin (MULTIVITAMIN) tablet Take 1 tablet by mouth daily.      . saw palmetto 160 MG capsule Take 160 mg by mouth 2 (two) times daily.      Marland Kitchen VALERIAN PO Take 1 tablet by mouth at bedtime as needed and may repeat dose one time if needed.       No current facility-administered medications for this visit.    Lab Results: No results found for this or any previous visit (from the past 48 hour(s)).  Physical Findings: AIMS:  , ,  ,  ,    CIWA:    COWS:     Treatment Plan Summary: At this time this patient will continue taking Ativan 0.5 mg in the morning and  continue the high dose of Remeron 45 mg at night. Overall I think these medications are beneficial. Today reviewed the nonpharmacological 5 interventions to treat depression. Apparently he is doing all of them. He is volunteering staying active taking his medicines and is Spiritual.  Plan:  Medical Decision Making Problem Points:  Established problem, stable/improving (1) Data Points:  Review of medication regiment & side effects (2)  I certify that inpatient services furnished can reasonably be expected to improve the patient's condition.   Berneta Sages [place] Gopal 08/18/2013, 2:02 PM

## 2013-11-01 ENCOUNTER — Ambulatory Visit: Payer: Medicare Other | Admitting: Diagnostic Neuroimaging

## 2013-11-26 ENCOUNTER — Ambulatory Visit (HOSPITAL_COMMUNITY): Payer: Self-pay | Admitting: Psychiatry

## 2013-12-03 ENCOUNTER — Ambulatory Visit (INDEPENDENT_AMBULATORY_CARE_PROVIDER_SITE_OTHER): Payer: 59 | Admitting: Psychiatry

## 2013-12-03 DIAGNOSIS — G4700 Insomnia, unspecified: Secondary | ICD-10-CM

## 2013-12-03 DIAGNOSIS — F332 Major depressive disorder, recurrent severe without psychotic features: Secondary | ICD-10-CM

## 2013-12-03 DIAGNOSIS — G20 Parkinson's disease: Secondary | ICD-10-CM

## 2013-12-03 DIAGNOSIS — F418 Other specified anxiety disorders: Secondary | ICD-10-CM

## 2013-12-03 DIAGNOSIS — F339 Major depressive disorder, recurrent, unspecified: Secondary | ICD-10-CM

## 2013-12-03 MED ORDER — MIRTAZAPINE 45 MG PO TABS: 45.0000 mg | ORAL_TABLET | Freq: Every day | ORAL | Status: DC

## 2013-12-03 MED ORDER — LORAZEPAM 0.5 MG PO TABS: 0.5000 mg | ORAL_TABLET | Freq: Every morning | ORAL | Status: DC

## 2013-12-03 MED ORDER — SERTRALINE HCL 100 MG PO TABS: 100.0000 mg | ORAL_TABLET | Freq: Every day | ORAL | Status: DC

## 2013-12-03 NOTE — Progress Notes (Signed)
[facility] MD Progress Note  12/03/2013 9:17 AM  KUEHL  MRN:  [id number] Subjective: Not good Today the patient complains of feeling more and more listless. He mainly describes a lack of motivation lack of ump. He really denies that he has a low energy level. In a reevaluation of his mood state he does acknowledge in a general way that his mood is unusual uncomfortable and unnatural. He clearly can acknowledge that his mood has gotten worse and is not his baseline state. In essence I do think this patient is needing a broad definition of dysphoria. He continues to sleep well and he continues to keep and not lose weight. However his appetite is distinctly changed. His energy level again is fair he has not anhedonic as he enjoys reading watching TV and other things. He does describe a sense of feeling a low self-esteem. He distinctly denies being suicidal. He's had no real physical changes since I seen him last. The patient denies any significant psychosocial stressors. But in a close evaluation he does have some issues. He lives with his sister who is also elderly. His sister the patient and the patient's wife for all concerned of what the future is going to bring in terms of the living situation. In general the patient feels somewhat of an accidental crisis. 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 He is little that at this time. He wonders what is out there for him. He clearly feels empty and somewhat lost. He is issues with direction and motivation. I shared with him the benefits of psychotherapy around this . I think it is also likely a biological contribution from Parkinson's. He agrees with this assessment. Diagnosis:   DSM5: Schizophrenia Disorders:   Obsessive-Compulsive Disorders:   Trauma-Stressor Disorders:   Substance/Addictive Disorders:   Depressive Disorders:  Major Depressive Disorder - Mild (296.21) Total Time spent with patient: 30 minutes  Axis I: Major Depression,  Recurrent severe  ADL's:  Intact  Sleep: Good  Appetite:  Fair  Suicidal Ideation:  no Homicidal Ideation:  none AEB (as evidenced by):  Psychiatric Specialty Exam: Physical Exam  ROS  There were no vitals taken for this visit.There is no weight on file to calculate BMI.  General Appearance: Casual  Eye Contact::  Good  Speech:  Clear and Coherent  Volume:  Normal  Mood:  Depressed  Affect:  Congruent and Depressed  Thought Process:  Coherent  Orientation:  Full (Time, Place, and Person)  Thought Content:  WDL  Suicidal Thoughts:  No  Homicidal Thoughts:  No  Memory:  NA  Judgement:  NA  Insight:  Good  Psychomotor Activity:  Normal  Concentration:  Good  Recall:  Good  Fund of Knowledge:Good  Language: Good  Akathisia:  No  Handed:  Right  AIMS (if indicated):     Assets:  Communication Skills  Sleep:      Musculoskeletal: Strength & Muscle Tone:  Gait & Station:  Patient leans:   Current Medications: Current Outpatient Prescriptions  Medication Sig Dispense Refill  . carbidopa-levodopa (SINEMET IR) 25-100 MG per tablet Take 1 tablet by mouth 3 (three) times daily.  90 tablet  12  . cholecalciferol (VITAMIN D) 1000 UNITS tablet Take 1,000 Units by mouth daily.      . fish oil-omega-3 fatty acids 1000 MG capsule Take 2 g by mouth daily.      Marland Kitchen GABAPENTIN, PHN, PO Take 190 mg by mouth at bedtime.      Marland Kitchen  LORazepam (ATIVAN) 0.5 MG tablet Take 1 tablet (0.5 mg total) by mouth every morning. 1 qam  30 tablet  5  . mirtazapine (REMERON) 45 MG tablet Take 1 tablet (45 mg total) by mouth at bedtime.  30 tablet  5  . Multiple Vitamin (MULTIVITAMIN) tablet Take 1 tablet by mouth daily.      . saw palmetto 160 MG capsule Take 160 mg by mouth 2 (two) times daily.      . sertraline (ZOLOFT) 100 MG tablet Take 1 tablet (100 mg total) by mouth daily.  30 tablet  10  . VALERIAN PO Take 1 tablet by mouth at bedtime as needed and may repeat dose one time if needed.        No current facility-administered medications for this visit.    Lab Results: No results found for this or any previous visit (from the past 48 hour(s)).  Physical Findings: AIMS:  , ,  ,  ,    CIWA:    COWS:     Treatment Plan Summary: At this time we will go ahead and begin this patient on Zoloft. He'll take 100 mg pill and take a half of it in the morning for 6 days and then increase it to one full pill after that. He shall continue on Remeron 45 mg and continue taking Ativan in the morning. I will make an effort to investigate the possibility of finding a therapist for this patient. This patient return to see me in 7 weeks.   Plan:  Medical Decision Making Problem Points:  Established problem, worsening (2) Data Points:  Review of new medications or change in dosage (2)  I certify that inpatient services furnished can reasonably be expected to improve the patient's condition.   Rhiana Morash, [place] 12/03/2013, 9:17 AM

## 2014-01-28 ENCOUNTER — Ambulatory Visit (INDEPENDENT_AMBULATORY_CARE_PROVIDER_SITE_OTHER): Payer: 59 | Admitting: Psychiatry

## 2014-01-28 VITALS — BP 120/78 | HR 82 | Ht 70.47 in | Wt 164.0 lb

## 2014-01-28 DIAGNOSIS — F418 Other specified anxiety disorders: Secondary | ICD-10-CM

## 2014-01-28 DIAGNOSIS — F329 Major depressive disorder, single episode, unspecified: Secondary | ICD-10-CM

## 2014-01-28 DIAGNOSIS — F321 Major depressive disorder, single episode, moderate: Secondary | ICD-10-CM

## 2014-01-28 DIAGNOSIS — G4700 Insomnia, unspecified: Secondary | ICD-10-CM

## 2014-01-28 DIAGNOSIS — G20 Parkinson's disease: Secondary | ICD-10-CM

## 2014-01-28 MED ORDER — LORAZEPAM 0.5 MG PO TABS: 0.5000 mg | ORAL_TABLET | Freq: Every morning | ORAL | Status: DC

## 2014-01-28 MED ORDER — MIRTAZAPINE 45 MG PO TABS: 45.0000 mg | ORAL_TABLET | Freq: Every day | ORAL | Status: DC

## 2014-01-28 MED ORDER — SERTRALINE HCL 100 MG PO TABS: 100.0000 mg | ORAL_TABLET | Freq: Every day | ORAL | Status: DC

## 2014-01-28 NOTE — Progress Notes (Signed)
[facility] MD Progress Note  01/28/2014 11:57 AM Lee Martinez  MRN:  [id number] Subjective: Somewhat better, I think the medicine is helping Today the patient says that he can tell the medicine is having some benefits. His appetite is better. His energy level is a bit better as well. The patient is still enjoys life watching TV and reading. His sense of self is improved. 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 The patient cannot define any significant psychosocial stressor. Other interventions that likely should be used would be to send him to an exercise gym with [facility]. Today we talked about support group and is hesitant to do that. The patient denies anxiety. He denies any psychotic symptoms. The patient's Parkinson symptoms are stable. Diagnosis:   DSM5: Schizophrenia Disorders:   Obsessive-Compulsive Disorders:   Trauma-Stressor Disorders:   Substance/Addictive Disorders:   Depressive Disorders:  Major Depressive Disorder - Mild (296.21) Total Time spent with patient: 30 minutes  Axis I: Major Depression, single episode  ADL's:  Intact  Sleep: Good  Appetite:  Good  Suicidal Ideation:  noHomicidal Ideation:  none AEB (as evidenced by):  Psychiatric Specialty Exam: Physical Exam  ROS  Blood pressure 120/78, pulse 82, height 5' 10.47" (1.79 m), weight 164 lb (74.39 kg).Body mass index is 23.22 kg/(m^2).  General Appearance: Casual  Eye Contact::  Good  Speech:  Clear and Coherent  Volume:  Normal  Mood:  Dysphoric  Affect:  Blunt  Thought Process:  Coherent  Orientation:  Full (Time, Place, and Person)  Thought Content:  NA  Suicidal Thoughts:  No  Homicidal Thoughts:  No  Memory:  NA  Judgement:  Good  Insight:  Good  Psychomotor Activity:  Normal  Concentration:  Good  Recall:  Good  Fund of Knowledge:Good  Language: Good  Akathisia:  No  Handed:  Right  AIMS (if indicated):     Assets:  Desire for Improvement  Sleep:       Musculoskeletal: Strength & Muscle Tone:  Gait & Station:  Patient leans:   Current Medications: Current Outpatient Prescriptions  Medication Sig Dispense Refill  . carbidopa-levodopa (SINEMET IR) 25-100 MG per tablet Take 1 tablet by mouth 3 (three) times daily.  90 tablet  12  . cholecalciferol (VITAMIN D) 1000 UNITS tablet Take 1,000 Units by mouth daily.      . fish oil-omega-3 fatty acids 1000 MG capsule Take 2 g by mouth daily.      Marland Kitchen GABAPENTIN, PHN, PO Take 190 mg by mouth at bedtime.      Marland Kitchen LORazepam (ATIVAN) 0.5 MG tablet Take 1 tablet (0.5 mg total) by mouth every morning. 1 qam  30 tablet  5  . mirtazapine (REMERON) 45 MG tablet Take 1 tablet (45 mg total) by mouth at bedtime.  30 tablet  5  . Multiple Vitamin (MULTIVITAMIN) tablet Take 1 tablet by mouth daily.      . saw palmetto 160 MG capsule Take 160 mg by mouth 2 (two) times daily.      . sertraline (ZOLOFT) 100 MG tablet Take 1 tablet (100 mg total) by mouth daily. 1 and a half  45 tablet  10  . VALERIAN PO Take 1 tablet by mouth at bedtime as needed and may repeat dose one time if needed.       No current facility-administered medications for this visit.    Lab Results: No results found for this or any previous visit (  from the past 48 hour(s)).  Physical Findings: AIMS:  , ,  ,  ,    CIWA:    COWS:     Treatment Plan Summary: At this time we shall increase this patient's Zoloft from 100 mg one in the morning to taking 1-1/2 each morning. We'll hold off with any changes with Remeron. He is taking 45 mg and I think this is the best dose given that the higher dose produces more energy. His appetite seems stable. He takes a small dose of Ativan first thing in the morning. This patient a roll says he is 30% improved. He is not suicidal. This patient to return to see me in 2 months.  Plan:  Medical Decision Making Problem Points:  Established problem, stable/improving (1) Data Points:  Review of medication  regiment & side effects (2)  I certify that inpatient services furnished can reasonably be expected to improve the patient's condition.   Heiley Shaikh, [place] 01/28/2014, 11:57 AM

## 2014-02-06 ENCOUNTER — Other Ambulatory Visit: Payer: Self-pay | Admitting: Diagnostic Neuroimaging

## 2014-02-10 ENCOUNTER — Ambulatory Visit (INDEPENDENT_AMBULATORY_CARE_PROVIDER_SITE_OTHER): Payer: Medicare Other | Admitting: Diagnostic Neuroimaging

## 2014-02-10 ENCOUNTER — Encounter: Payer: Self-pay | Admitting: Diagnostic Neuroimaging

## 2014-02-10 ENCOUNTER — Encounter (INDEPENDENT_AMBULATORY_CARE_PROVIDER_SITE_OTHER): Payer: Self-pay

## 2014-02-10 VITALS — BP 105/69 | HR 90 | Temp 98.4°F | Ht 71.75 in | Wt 163.4 lb

## 2014-02-10 DIAGNOSIS — G20 Parkinson's disease: Secondary | ICD-10-CM

## 2014-02-10 MED ORDER — CARBIDOPA-LEVODOPA 25-100 MG PO TABS: 2.0000 | ORAL_TABLET | Freq: Three times a day (TID) | ORAL | Status: DC

## 2014-02-10 NOTE — Patient Instructions (Signed)
Increase carbidopa/levodopa up to 1.5 tabs three times per day for 1 month, then increase to 2 tabs three times per day.  Caution with driving and walking/balance.

## 2014-02-10 NOTE — Progress Notes (Signed)
PATIENT: Lee Martinez DOB: [birth year]   REASON FOR VISIT: routine follow up for Parkinson's Disease HISTORY FROM: patient  Chief Complaint  Patient presents with  . Follow-up    PD     HISTORY OF PRESENT ILLNESS:  UPDATE 02/10/14: Since last visit, tremor and anxiety are persistent. Some wearing off before doses. Incomplete tremor control with each dose. No peak dyskinesias.   UPDATE 04/26/13 (LL): Since last visit, patient met with psychiatrist and his Remeron was increased.  He reports that his mood and anxiety is better.  He is sleeping better with aid from [place].  Denies problems with Sinemet wearing off, constipation, urinary frequency, or balance problems.   UPDATE 01/19/13: Since last visit, patient did not followup. He's not sure what happened. Patient's main complaint today is anxiety and sleep problems. He has been taking mirtazapine for several months without relief. In the past he did not express interest in SSRI or other medication. Now he is more amenable to these medications. He's never seen psychiatry.  Regarding tremor, stiffness and slowness of muscles, he feels the symptoms are stable. Carbidopa/levodopa seems to help a little bit. His wife has not noticed much difference. Patient did not go up on carbidopa/levodopa dosing last time.   UPDATE 12/04/11: Doing about the same. Not much benefit on carb/levo, although has only been on 1 tab TID x 1.5 weeks. No side effects. Asking about acupuncture. Having some anxiety and sleep problems. Not interested in SSRI or other meds now.   PRIOR HPI: 78 year old right-handed male with history of gastric ulcer, here for evaluation of tremor. Patient reports tremor in the left upper extremity starting 3 years ago. Now tremor affecting his right upper extremity and legs. Denies voice or chin tremor. Tremor mainly affects him when he has stressed or tired. He describes a primarily resting tremor which improves with action.  Patient denies dizziness but has mild uneasiness with walking. No change in sense of smell or taste. No reports of acting out dreams or REM behavior disorder symptoms. He has developed mild anxiety over the past one year. No constipation. Several weeks ago patient saw a homeopathic doctor who evaluated this tremor problem and diagnosed him with a "ganglion problem" likely due to "parasites". He was not prescribed any medications.    REVIEW OF SYSTEMS: Full 14 system review of systems performed and notable only for depression, anxiety, memory loss, tremors.  ALLERGIES: No Known Allergies  HOME MEDICATIONS: Outpatient Prescriptions Prior to Visit  Medication Sig Dispense Refill  . cholecalciferol (VITAMIN D) 1000 UNITS tablet Take 1,000 Units by mouth daily.      . fish oil-omega-3 fatty acids 1000 MG capsule Take 2 g by mouth daily.      Marland Kitchen GABAPENTIN, PHN, PO Take 190 mg by mouth at bedtime.      Marland Kitchen LORazepam (ATIVAN) 0.5 MG tablet Take 1 tablet (0.5 mg total) by mouth Martinez morning. 1 qam  30 tablet  5  . mirtazapine (REMERON) 45 MG tablet Take 1 tablet (45 mg total) by mouth at bedtime.  30 tablet  5  . Multiple Vitamin (MULTIVITAMIN) tablet Take 1 tablet by mouth daily.      . saw palmetto 160 MG capsule Take 160 mg by mouth 2 (two) times daily.      . sertraline (ZOLOFT) 100 MG tablet Take 1 tablet (100 mg total) by mouth daily. 1 and a half  45 tablet  10  . VALERIAN PO  Take 1 tablet by mouth at bedtime as needed and may repeat dose one time if needed.      . carbidopa-levodopa (SINEMET IR) 25-100 MG per tablet take 1 tablet by mouth three times a day  90 tablet  0   No facility-administered medications prior to visit.    PAST MEDICAL HISTORY: Past Medical History  Diagnosis Date  . Parkinson's disease 07/2011    followed by [facility]    PAST SURGICAL HISTORY: Past Surgical History  Procedure Laterality Date  . Thyroid surgery    . Hernia repair      FAMILY  HISTORY: Family History  Problem Relation Age of Onset  . Heart failure Mother     SOCIAL HISTORY: History   Social History  . Marital Status: Married    Spouse Name: serena    Number of Children: 0  . Years of Education: 12   Occupational History  . retired    Social History Main Topics  . Smoking status: Never Smoker   . Smokeless tobacco: Not on file  . Alcohol Use: Yes  . Drug Use: No  . Sexual Activity: Yes    Partners: Female   Other Topics Concern  . Not on file   Social History Narrative  . No narrative on file     PHYSICAL EXAM  Filed Vitals:   02/10/14 1439  BP: 105/69  Pulse: 90  Temp: 98.4 F (36.9 C)  TempSrc: Oral  Height: 5' 11.75" (1.822 m)  Weight: 163 lb 6.4 oz (74.118 kg)   Body mass index is 22.33 kg/(m^2).  Generalized: Patient is awake, alert and in no acute distress. Well developed and groomed.  Neck: Supple, no carotid bruits  Cardiac: Regular rate rhythm, no murmur   Neurological examination  Mental Status: Awake, alert. Language is fluent and comprehension intact. POSITIVE MYERSONS. MASKED FACIES.  Cranial Nerves: Pupils are equal and reactive to light. Visual fields are full to confrontation. Conjugate eye movements are full and symmetric. Facial sensation and strength are symmetric. Hearing is intact. Palate elevated symmetrically and uvula is midline. Shoulder shrug is symmetric. Tongue is midline.  Motor: RESTING TREMOR (LUE > RUE > BLE). Normal bulk and MILD COGWHEELING IN LUE. BRADYKINESIA IN LUE AND BLE. Full strength in the upper and lower extremities. No pronator drift.  Sensory: Intact and symmetric to light touch, pinprick, temperature, vibration.  Coordination: No ataxia or dysmetria on finger-nose or rapid alternating movement testing.  Reflexes: Deep tendon reflexes in the upper and lower extremity are present and symmetric. Gait and Station: STOOPED POSTURE. LUE TREMOR WITH WALKING. POOR ARM SWING. SHUFFLING GAIT WITH  LEFT FOOT. Tandem gait is UNSTEADY. Romberg is negative.    DIAGNOSTIC DATA (LABS, IMAGING, TESTING)   11/04/11 MRI brain  1. Mild periventricular gliosis. Few punctate non-specific subcortical foci of gliosis. May represent chronic small vessel ischemic disease.  2. No acute findings.    ASSESSMENT AND PLAN  78 y.o. male with progressive resting tremor left greater than right upper extremities, bilateral lower extremities, with cognitive rigidity, bradykinesia, stooped posture and Myerson sign. Mild response to carb/levo.   Dx: idiopathic Parkinson's disease.   PLAN:  1. Increase carbidopa-levodopa up to 1.5 tabs TID, then 2 tabs TID 2. Caution with balance, walking and driving  Return in about 4 months (around 06/13/2014).   Penni Bombard, MD 32/07/5571, 2:20 PM Certified in Neurology, Neurophysiology and Neuroimaging  [facility] [address] [place], [place] [address] [phone]

## 2014-03-16 ENCOUNTER — Other Ambulatory Visit (HOSPITAL_COMMUNITY): Payer: Self-pay | Admitting: *Deleted

## 2014-03-16 ENCOUNTER — Telehealth (HOSPITAL_COMMUNITY): Payer: Self-pay | Admitting: *Deleted

## 2014-03-16 DIAGNOSIS — G4700 Insomnia, unspecified: Secondary | ICD-10-CM

## 2014-03-16 DIAGNOSIS — F418 Other specified anxiety disorders: Secondary | ICD-10-CM

## 2014-03-16 DIAGNOSIS — G20 Parkinson's disease: Secondary | ICD-10-CM

## 2014-03-16 NOTE — Telephone Encounter (Signed)
By all means it's okay to give him a 90 day supply of all 3 medicines Zoloft, Remeron and Ativan.

## 2014-03-16 NOTE — Telephone Encounter (Signed)
Lee Martinez left VM: States he needs three prescriptions changed to 90 day and sent to [facility]. He states he can then receive them for free. RX requested: Sertraline 100 mg, take 1.5 tablets (150 mg) daily - last ordered 01/28/14 escript to [facility] Mirtazapine 45 mg at HS daily-last ordered 01/28/14 escript to [facility] Lorazepam 0.5 mg, one daily every AM - last given written RX 01/28/14

## 2014-03-16 NOTE — Telephone Encounter (Signed)
Yes by all meansplease send all his medicines for a 90 day supply. This includes sertraline, Remeron and Ativan.

## 2014-03-17 ENCOUNTER — Other Ambulatory Visit (HOSPITAL_COMMUNITY): Payer: Self-pay | Admitting: *Deleted

## 2014-03-17 DIAGNOSIS — G20 Parkinson's disease: Secondary | ICD-10-CM

## 2014-03-17 DIAGNOSIS — G4700 Insomnia, unspecified: Secondary | ICD-10-CM

## 2014-03-17 DIAGNOSIS — F418 Other specified anxiety disorders: Secondary | ICD-10-CM

## 2014-03-17 MED ORDER — LORAZEPAM 0.5 MG PO TABS: 0.5000 mg | ORAL_TABLET | Freq: Every morning | ORAL | Status: DC

## 2014-03-17 MED ORDER — SERTRALINE HCL 100 MG PO TABS: 100.0000 mg | ORAL_TABLET | Freq: Every day | ORAL | Status: DC

## 2014-03-17 MED ORDER — MIRTAZAPINE 45 MG PO TABS: 45.0000 mg | ORAL_TABLET | Freq: Every day | ORAL | Status: DC

## 2014-03-17 NOTE — Telephone Encounter (Signed)
Per Dr. Karen Chafe orders - send/call prescriptions for 90 day supply/one refill  of Sertraline, Mirtazapine and Lorazepam to [facility]. Per Dr.Plovsky's order: Contacted local pharmacy and discontinued refills on file there. Notified patient that RX called/sent to [facility] and that remaining refills discontinued at local pharmacy

## 2014-03-21 ENCOUNTER — Telehealth (HOSPITAL_COMMUNITY): Payer: Self-pay | Admitting: *Deleted

## 2014-03-21 DIAGNOSIS — F418 Other specified anxiety disorders: Secondary | ICD-10-CM

## 2014-03-21 MED ORDER — SERTRALINE HCL 100 MG PO TABS: 150.0000 mg | ORAL_TABLET | Freq: Every day | ORAL | Status: DC

## 2014-03-21 NOTE — Telephone Encounter (Signed)
Med clarification request received from [facility]: Is Zoloft 100 mg ONE or ONE and ONE HALF tablet daily? Faxed response-dose is 150 mg (1& 1/2 tablet daily) Also sending new RX via escribe

## 2014-04-06 ENCOUNTER — Ambulatory Visit (INDEPENDENT_AMBULATORY_CARE_PROVIDER_SITE_OTHER): Payer: 59 | Admitting: Psychiatry

## 2014-04-06 VITALS — BP 109/72 | HR 85 | Ht 71.5 in | Wt 162.6 lb

## 2014-04-06 DIAGNOSIS — F331 Major depressive disorder, recurrent, moderate: Secondary | ICD-10-CM

## 2014-04-06 DIAGNOSIS — G4700 Insomnia, unspecified: Secondary | ICD-10-CM

## 2014-04-06 DIAGNOSIS — F332 Major depressive disorder, recurrent severe without psychotic features: Secondary | ICD-10-CM

## 2014-04-06 DIAGNOSIS — F418 Other specified anxiety disorders: Secondary | ICD-10-CM

## 2014-04-06 MED ORDER — LORAZEPAM 0.5 MG PO TABS: 0.5000 mg | ORAL_TABLET | Freq: Every morning | ORAL | Status: DC

## 2014-04-06 MED ORDER — SERTRALINE HCL 100 MG PO TABS: 150.0000 mg | ORAL_TABLET | Freq: Every day | ORAL | Status: DC

## 2014-04-06 MED ORDER — CARBIDOPA-LEVODOPA 25-100 MG PO TABS: 2.0000 | ORAL_TABLET | Freq: Three times a day (TID) | ORAL | Status: DC

## 2014-04-06 MED ORDER — MIRTAZAPINE 45 MG PO TABS: 45.0000 mg | ORAL_TABLET | Freq: Every day | ORAL | Status: DC

## 2014-04-06 NOTE — Progress Notes (Signed)
[facility] MD Progress Note  04/06/2014 1:54 PM Lee Martinez  MRN:  [id number] Subjective: Lee Martinez The patient says his mood is very good. He is very positive. He's optimistic. He is sleeping and eating well. He's got good energy. He can think and concentrate without a problem. He enjoys reading and watching TV. Overall the patient is doing very well he believes the medicines have been helpful. We increased his Zoloft to the dose of 150 mg and he can tell the difference. His mood is good. The patient is energetic. Diagnosis:   DSM5: Schizophrenia Disorders:   Obsessive-Compulsive Disorders:   Trauma-Stressor Disorders:   Substance/Addictive Disorders:   Depressive Disorders:  Major Depressive Disorder - Mild (296.21) Total Time spent with patient: 15 minutes  Axis I: Major Depression, Recurrent severe  ADL's:  Intact  Sleep: Good  Appetite:  Good  Suicidal Ideation:  no Homicidal Ideation:  none AEB (as evidenced by):  Psychiatric Specialty Exam: Physical Exam  ROS  Blood pressure 109/72, pulse 85, height 5' 11.5" (1.816 m), weight 162 lb 9.6 oz (73.755 kg).Body mass index is 22.36 kg/(m^2).  General Appearance: Casual  Eye Contact::  Good  Speech:  Clear and Coherent  Volume:  Normal  Mood:  NA  Affect:  Congruent  Thought Process:  Coherent  Orientation:  Full (Time, Place, and Person)  Thought Content:  WDL  Suicidal Thoughts:  No  Homicidal Thoughts:  No  Memory:  NA  Judgement:  Good  Insight:  Good  Psychomotor Activity:  Normal  Concentration:  Good  Recall:  Good  Fund of Knowledge:Good  Language: Good  Akathisia:  No  Handed:  Right  AIMS (if indicated):     Assets:  Desire for Improvement  Sleep:      Musculoskeletal: Strength & Muscle Tone:  Gait & Station:  Patient leans:   Current Medications: Current Outpatient Prescriptions  Medication Sig Dispense Refill  . carbidopa-levodopa (SINEMET IR) 25-100 MG per tablet Take 2 tablets by mouth 3 (three)  times daily. 180 tablet 12  . cholecalciferol (VITAMIN D) 1000 UNITS tablet Take 1,000 Units by mouth daily.    . fish oil-omega-3 fatty acids 1000 MG capsule Take 2 g by mouth daily.    Marland Kitchen GABAPENTIN, PHN, PO Take 190 mg by mouth at bedtime.    Marland Kitchen LORazepam (ATIVAN) 0.5 MG tablet Take 1 tablet (0.5 mg total) by mouth every morning. 1 qam 90 tablet 1  . mirtazapine (REMERON) 45 MG tablet Take 1 tablet (45 mg total) by mouth at bedtime. 90 tablet 1  . Multiple Vitamin (MULTIVITAMIN) tablet Take 1 tablet by mouth daily.    . saw palmetto 160 MG capsule Take 160 mg by mouth 2 (two) times daily.    . sertraline (ZOLOFT) 100 MG tablet Take 1.5 tablets (150 mg total) by mouth daily. 135 tablet 1  . VALERIAN PO Take 1 tablet by mouth at bedtime as needed and may repeat dose one time if needed.     No current facility-administered medications for this visit.    Lab Results: No results found for this or any previous visit (from the past 48 hour(s)).  Physical Findings: AIMS:  , ,  ,  ,    CIWA:    COWS:     Treatment Plan Summary: At this time the patient will continue taking 150 mg of Zoloft. He'll continue taking Remeron 45 mg and continue Ativan as ordered. This patient is doing very well. He'll return  to see me in 4 months. Today reviewed the pros and cons of his medications and agreed to take them as prescribed. The patient is no neurological symptoms that are different.  Plan:  Medical Decision Making Problem Points:  Established problem, stable/improving (1) Data Points:  Review of medication regiment & side effects (2)  I certify that inpatient services furnished can reasonably be expected to improve the patient's condition.   [place] 04/06/2014, 1:54 PM

## 2014-06-13 ENCOUNTER — Ambulatory Visit (INDEPENDENT_AMBULATORY_CARE_PROVIDER_SITE_OTHER): Payer: 59 | Admitting: Diagnostic Neuroimaging

## 2014-06-13 ENCOUNTER — Encounter: Payer: Self-pay | Admitting: Diagnostic Neuroimaging

## 2014-06-13 VITALS — BP 115/73 | HR 107 | Temp 97.9°F | Ht 71.0 in | Wt 161.4 lb

## 2014-06-13 DIAGNOSIS — G20 Parkinson's disease: Secondary | ICD-10-CM

## 2014-06-13 NOTE — Patient Instructions (Signed)
Continue current medications.  Start daily exercise program (10-20 minutes).

## 2014-06-13 NOTE — Progress Notes (Signed)
PATIENT: Lee Martinez DOB: 03/03/1936   REASON FOR VISIT: routine follow up for Parkinson's Disease HISTORY FROM: patient  Chief Complaint  Patient presents with  . Follow-up    parkinsons     HISTORY OF PRESENT ILLNESS:  UPDATE 06/13/14: Since last visit, doing about the same. No wearing off. Anixety is still his main problem, but feels better than before.   UPDATE 02/10/14: Since last visit, tremor and anxiety are persistent. Some wearing off before doses. Incomplete tremor control with each dose. No peak dyskinesias.   UPDATE 04/26/13 (LL): Since last visit, patient met with psychiatrist and his Remeron was increased.  He reports that his mood and anxiety is better.  He is sleeping better with aid from [place].  Denies problems with Sinemet wearing off, constipation, urinary frequency, or balance problems.   UPDATE 01/19/13: Since last visit, patient did not followup. He's not sure what happened. Patient's main complaint today is anxiety and sleep problems. He has been taking mirtazapine for several months without relief. In the past he did not express interest in SSRI or other medication. Now he is more amenable to these medications. He's never seen psychiatry.  Regarding tremor, stiffness and slowness of muscles, he feels the symptoms are stable. Carbidopa/levodopa seems to help a little bit. His wife has not noticed much difference. Patient did not go up on carbidopa/levodopa dosing last time.   UPDATE 12/04/11: Doing about the same. Not much benefit on carb/levo, although has only been on 1 tab TID x 1.5 weeks. No side effects. Asking about acupuncture. Having some anxiety and sleep problems. Not interested in SSRI or other meds now.   PRIOR HPI: 79 year old right-handed male with history of gastric ulcer, here for evaluation of tremor. Patient reports tremor in the left upper extremity starting 3 years ago. Now tremor affecting his right upper extremity and legs. Denies  voice or chin tremor. Tremor mainly affects him when he has stressed or tired. He describes a primarily resting tremor which improves with action. Patient denies dizziness but has mild uneasiness with walking. No change in sense of smell or taste. No reports of acting out dreams or REM behavior disorder symptoms. He has developed mild anxiety over the past one year. No constipation. Several weeks ago patient saw a homeopathic doctor who evaluated this tremor problem and diagnosed him with a "ganglion problem" likely due to "parasites". He was not prescribed any medications.    REVIEW OF SYSTEMS: Full 14 system review of systems performed and notable only for depression, anxiety, memory loss, tremors.  ALLERGIES: No Known Allergies  HOME MEDICATIONS: Outpatient Prescriptions Prior to Visit  Medication Sig Dispense Refill  . carbidopa-levodopa (SINEMET IR) 25-100 MG per tablet Take 2 tablets by mouth 3 (three) times daily. 180 tablet 12  . cholecalciferol (VITAMIN D) 1000 UNITS tablet Take 1,000 Units by mouth daily.    . fish oil-omega-3 fatty acids 1000 MG capsule Take 2 g by mouth daily.    Marland Kitchen LORazepam (ATIVAN) 0.5 MG tablet Take 1 tablet (0.5 mg total) by mouth every morning. 1 qam 90 tablet 1  . mirtazapine (REMERON) 45 MG tablet Take 1 tablet (45 mg total) by mouth at bedtime. 90 tablet 1  . Multiple Vitamin (MULTIVITAMIN) tablet Take 1 tablet by mouth daily.    . saw palmetto 160 MG capsule Take 160 mg by mouth 2 (two) times daily.    . sertraline (ZOLOFT) 100 MG tablet Take 1.5 tablets (150  mg total) by mouth daily. 135 tablet 1  . VALERIAN PO Take 1 tablet by mouth at bedtime as needed and may repeat dose one time if needed.    Marland Kitchen GABAPENTIN, PHN, PO Take 190 mg by mouth at bedtime.     No facility-administered medications prior to visit.    PAST MEDICAL HISTORY: Past Medical History  Diagnosis Date  . Parkinson's disease 07/2011    followed by [facility]    PAST SURGICAL  HISTORY: Past Surgical History  Procedure Laterality Date  . Thyroid surgery    . Hernia repair      FAMILY HISTORY: Family History  Problem Relation Age of Onset  . Heart failure Mother     SOCIAL HISTORY: History   Social History  . Marital Status: Married    Spouse Name: serena    Number of Children: 0  . Years of Education: 12   Occupational History  . retired    Social History Main Topics  . Smoking status: Never Smoker   . Smokeless tobacco: Not on file  . Alcohol Use: Yes  . Drug Use: No  . Sexual Activity:    Partners: Female   Other Topics Concern  . Not on file   Social History Narrative     PHYSICAL EXAM  Filed Vitals:   06/13/14 1340  BP: 115/73  Pulse: 107  Temp: 97.9 F (36.6 C)  TempSrc: Oral  Height: 5\' 11"  (1.803 m)  Weight: 161 lb 6.4 oz (73.211 kg)   Body mass index is 22.52 kg/(m^2).  Generalized: Patient is awake, alert and in no acute distress. Well developed and groomed.  Neck: Supple, no carotid bruits  Cardiac: Regular rate rhythm, no murmur   Neurological examination  Mental Status: Awake, alert. Language is fluent and comprehension intact. POSITIVE MYERSONS. MASKED FACIES.  Cranial Nerves: Pupils are equal and reactive to light. Visual fields are full to confrontation. Conjugate eye movements are full and symmetric. Facial sensation and strength are symmetric. Hearing is intact. Palate elevated symmetrically and uvula is midline. Shoulder shrug is symmetric. Tongue is midline.  Motor: RESTING TREMOR (LUE > RUE > BLE). Normal bulk and MILD COGWHEELING IN LUE. BRADYKINESIA IN LUE AND BLE. MOUTH TREMOR WITH RAM. Full strength in the upper and lower extremities. No pronator drift.  Sensory: Intact and symmetric to light touch, pinprick, temperature, vibration.  Coordination: No ataxia or dysmetria on finger-nose or rapid alternating movement testing.  Reflexes: Deep tendon reflexes in the upper and lower extremity are present and  symmetric. Gait and Station: STOOPED POSTURE. LUE TREMOR WITH WALKING. POOR ARM SWING. SHUFFLING GAIT WITH LEFT FOOT. Tandem gait is UNSTEADY. Romberg is negative.    DIAGNOSTIC DATA (LABS, IMAGING, TESTING)   11/04/11 MRI brain  1. Mild periventricular gliosis. Few punctate non-specific subcortical foci of gliosis. May represent chronic small vessel ischemic disease.  2. No acute findings.    ASSESSMENT AND PLAN  79 y.o. male with progressive resting tremor left greater than right upper extremities, bilateral lower extremities, with cognitive rigidity, bradykinesia, stooped posture and Myerson sign. Mild response to carb/levo.   Dx: idiopathic Parkinson's disease.   PLAN:  1. Continue carbidopa-levodopa 2 tabs TID 2. Offered additional meds for better tremor control (ropinirole, pramipexole, neuropro or azilect) but pt wants to hold off for now 3. Caution with balance, walking and driving  Return in about 6 months (around 12/12/2014).     02/11/2015, MD 06/13/2014, 2:26 PM Certified in Neurology,  Neurophysiology and Neuroimaging  [facility] [address] [place], [place] [address] [phone]

## 2014-06-21 DIAGNOSIS — L821 Other seborrheic keratosis: Secondary | ICD-10-CM | POA: Diagnosis not present

## 2014-06-21 DIAGNOSIS — Z85828 Personal history of other malignant neoplasm of skin: Secondary | ICD-10-CM | POA: Diagnosis not present

## 2014-06-21 DIAGNOSIS — D225 Melanocytic nevi of trunk: Secondary | ICD-10-CM | POA: Diagnosis not present

## 2014-06-21 DIAGNOSIS — C44719 Basal cell carcinoma of skin of left lower limb, including hip: Secondary | ICD-10-CM | POA: Diagnosis not present

## 2014-07-11 DIAGNOSIS — E784 Other hyperlipidemia: Secondary | ICD-10-CM | POA: Diagnosis not present

## 2014-07-11 DIAGNOSIS — Z1389 Encounter for screening for other disorder: Secondary | ICD-10-CM | POA: Diagnosis not present

## 2014-07-11 DIAGNOSIS — Z79899 Other long term (current) drug therapy: Secondary | ICD-10-CM | POA: Diagnosis not present

## 2014-08-03 ENCOUNTER — Encounter (HOSPITAL_COMMUNITY): Payer: Self-pay | Admitting: Psychiatry

## 2014-08-03 ENCOUNTER — Ambulatory Visit (INDEPENDENT_AMBULATORY_CARE_PROVIDER_SITE_OTHER): Payer: Medicare Other | Admitting: Psychiatry

## 2014-08-03 VITALS — BP 113/72 | HR 86 | Ht 71.0 in | Wt 159.0 lb

## 2014-08-03 DIAGNOSIS — F329 Major depressive disorder, single episode, unspecified: Secondary | ICD-10-CM | POA: Diagnosis not present

## 2014-08-03 DIAGNOSIS — F331 Major depressive disorder, recurrent, moderate: Secondary | ICD-10-CM

## 2014-08-03 DIAGNOSIS — G4700 Insomnia, unspecified: Secondary | ICD-10-CM

## 2014-08-03 DIAGNOSIS — F418 Other specified anxiety disorders: Secondary | ICD-10-CM

## 2014-08-03 MED ORDER — MIRTAZAPINE 45 MG PO TABS: 45.0000 mg | ORAL_TABLET | Freq: Every day | ORAL | Status: DC

## 2014-08-03 MED ORDER — LORAZEPAM 0.5 MG PO TABS: 0.5000 mg | ORAL_TABLET | Freq: Every morning | ORAL | Status: DC

## 2014-08-03 MED ORDER — SERTRALINE HCL 100 MG PO TABS: 150.0000 mg | ORAL_TABLET | Freq: Every day | ORAL | Status: DC

## 2014-08-03 NOTE — Progress Notes (Addendum)
[facility] MD Progress Note  08/03/2014 1:46 PM Lee Martinez  MRN:  [id number] Subjective:  Well Today the patient is doing well. His mood is okay. He still does his ministry. He is sleeping and eating well has good energy. He does have a mild tremor and takes Sinemet for it. He is bothered by his Parkinson's. The patient is active he functions well. His family is good. He is no real complaints other than the fact that he would like to try transmit magnetic stimulation. He would not like to try it for depression but claims that he is has information that is been used for Parkinson's symptoms. He asked his neurologist about this but his neurologist and nothing about it. I shared with the patient I do nothing about it and he requested that I ask the people who do it here. He opts legally get it done here or at [facility] but he have to pay for it. She is really requesting if we could do it through Medicare. I shared with him I will make an attempt to talk to someone about it. Overall the patient is doing well. He does not drink any alcohol. He is functioning well and is not suicidal. Today reviewed the pros and cons of his medications and agreed to take them as prescribed. The patient denies any chest pain or shortness of breath. He shows no neurological symptoms other than a Parkinson's tremor. He does demonstrate bradykinesia. Principal Problem: Major Depression Diagnosis:   Patient Active Problem List   Diagnosis Date Noted  . Major depressive disorder, recurrent episode, unspecified [F33.9] 08/18/2013  . Major depressive disorder, single episode [F32.9] 06/17/2013  . Mood disorder in conditions classified elsewhere [F06.30] 03/31/2013  . Parkinson's disease [G20] 01/19/2013   Total Time spent with patient: 30 minutes   Past Medical History:  Past Medical History  Diagnosis Date  . Parkinson's disease 07/2011    followed by [facility] Neurology    Past Surgical History  Procedure Laterality Date  .  Thyroid surgery    . Hernia repair     Family History:  Family History  Problem Relation Age of Onset  . Heart failure Mother    Social History:  History  Alcohol Use No     History  Drug Use No    History   Social History  . Marital Status: Married    Spouse Name: serena  . Number of Children: 0  . Years of Education: 12   Occupational History  . retired    Social History Main Topics  . Smoking status: Never Smoker   . Smokeless tobacco: Never Used  . Alcohol Use: No  . Drug Use: No  . Sexual Activity:    Partners: Female   Other Topics Concern  . None   Social History Narrative   Additional History:    Sleep: Good  Appetite:  Good   Assessment:   Musculoskeletal: Strength & Muscle Tone: cogwheel Gait & Station: normal Patient leans: Right   Psychiatric Specialty Exam: Physical Exam  ROS  Blood pressure 113/72, pulse 86, height 5\' 11"  (1.803 m), weight 159 lb (72.122 kg).Body mass index is 22.19 kg/(m^2).  General Appearance: Casual  Eye Contact::  Good  Speech:  Normal Rate  Volume:  Normal  Mood:  NA  Affect:  Appropriate  Thought Process:  Coherent  Orientation:  Full (Time, Place, and Person)  Thought Content:  WDL  Suicidal Thoughts:  No  Homicidal Thoughts:  No  Memory:  NA  Judgement:  Good  Insight:  Good  Psychomotor Activity:  Normal  Concentration:  Good  Recall:  Good  Fund of Knowledge:Good  Language: Good  Akathisia:  No  Handed:  Right  AIMS (if indicated):     Assets:  Desire for Improvement  ADL's:  Intact  Cognition: WNL  Sleep:        Current Medications: Current Outpatient Prescriptions  Medication Sig Dispense Refill  . carbidopa-levodopa (SINEMET IR) 25-100 MG per tablet Take 2 tablets by mouth 3 (three) times daily. 180 tablet 12  . cholecalciferol (VITAMIN D) 1000 UNITS tablet Take 1,000 Units by mouth daily.    . fish oil-omega-3 fatty acids 1000 MG capsule Take 2 g by mouth daily.    Marland Kitchen LORazepam  (ATIVAN) 0.5 MG tablet Take 1 tablet (0.5 mg total) by mouth every morning. 1 qam 90 tablet 1  . mirtazapine (REMERON) 45 MG tablet Take 1 tablet (45 mg total) by mouth at bedtime. 90 tablet 1  . Multiple Vitamin (MULTIVITAMIN) tablet Take 1 tablet by mouth daily.    . saw palmetto 160 MG capsule Take 160 mg by mouth 2 (two) times daily.    . sertraline (ZOLOFT) 100 MG tablet Take 1.5 tablets (150 mg total) by mouth daily. 135 tablet 1  . VALERIAN PO Take 1 tablet by mouth at bedtime as needed and may repeat dose one time if needed.     No current facility-administered medications for this visit.    Lab Results: No results found for this or any previous visit (from the past 48 hour(s)).  Physical Findings: AIMS:  , ,  ,  ,    CIWA:    COWS:     Treatment Plan Summary: At this time the patient continue taking all the medications he is on. He'll continue Zoloft 150 mg continue Remeron as prescribed and continue a tiny dose of Ativan 0.5 mg each morning. Today I will make an effort to find out more about trans-magnetic stimulation for this patient. Otherwise is patient is very stable and will return to see me in 5 months.   Medical Decision Making:  Independent Review of image, tracing or specimen (2), Review of Medication Regimen & Side Effects (2) and Review of New Medication or Change in Dosage (2)     Lee Martinez 08/03/2014, 1:46 PM

## 2014-08-04 ENCOUNTER — Telehealth (HOSPITAL_COMMUNITY): Payer: Self-pay

## 2014-08-04 NOTE — Telephone Encounter (Signed)
Raynell picked up his prescription on 08/04/14 [place]  [id number]  dlo

## 2014-12-12 ENCOUNTER — Ambulatory Visit (INDEPENDENT_AMBULATORY_CARE_PROVIDER_SITE_OTHER): Payer: Medicare Other | Admitting: Diagnostic Neuroimaging

## 2014-12-12 ENCOUNTER — Encounter: Payer: Self-pay | Admitting: Diagnostic Neuroimaging

## 2014-12-12 VITALS — BP 119/71 | HR 81 | Ht 71.0 in | Wt 159.0 lb

## 2014-12-12 DIAGNOSIS — G20 Parkinson's disease: Secondary | ICD-10-CM | POA: Diagnosis not present

## 2014-12-12 MED ORDER — CARBIDOPA-LEVODOPA 25-100 MG PO TABS: 2.0000 | ORAL_TABLET | Freq: Three times a day (TID) | ORAL | Status: DC

## 2014-12-12 NOTE — Patient Instructions (Signed)
Continue current medications. 

## 2014-12-12 NOTE — Progress Notes (Signed)
PATIENT: Lee Martinez DOB: 29-Mar-1936   REASON FOR VISIT: routine follow up for Parkinson's Disease HISTORY FROM: patient  Chief Complaint  Patient presents with  . Parkinson's Disease    rm 42, wife - [name]  . Follow-up     HISTORY OF PRESENT ILLNESS:  UPDATE 12/12/14: Since last visit, symptoms are stable. No falls. No wearing off. Minimal benefit with carb/levo.   UPDATE 06/13/14: Since last visit, doing about the same. No wearing off. Anixety is still his main problem, but feels better than before.   UPDATE 02/10/14: Since last visit, tremor and anxiety are persistent. Some wearing off before doses. Incomplete tremor control with each dose. No peak dyskinesias.   UPDATE 04/26/13 (LL): Since last visit, patient met with psychiatrist and his Remeron was increased.  He reports that his mood and anxiety is better.  He is sleeping better with aid from [place].  Denies problems with Sinemet wearing off, constipation, urinary frequency, or balance problems.   UPDATE 01/19/13: Since last visit, patient did not followup. He's not sure what happened. Patient's main complaint today is anxiety and sleep problems. He has been taking mirtazapine for several months without relief. In the past he did not express interest in SSRI or other medication. Now he is more amenable to these medications. He's never seen psychiatry.  Regarding tremor, stiffness and slowness of muscles, he feels the symptoms are stable. Carbidopa/levodopa seems to help a little bit. His wife has not noticed much difference. Patient did not go up on carbidopa/levodopa dosing last time.   UPDATE 12/04/11: Doing about the same. Not much benefit on carb/levo, although has only been on 1 tab TID x 1.5 weeks. No side effects. Asking about acupuncture. Having some anxiety and sleep problems. Not interested in SSRI or other meds now.   PRIOR HPI: 79 year old right-handed male with history of gastric ulcer, here for evaluation of  tremor. Patient reports tremor in the left upper extremity starting 3 years ago. Now tremor affecting his right upper extremity and legs. Denies voice or chin tremor. Tremor mainly affects him when he has stressed or tired. He describes a primarily resting tremor which improves with action. Patient denies dizziness but has mild uneasiness with walking. No change in sense of smell or taste. No reports of acting out dreams or REM behavior disorder symptoms. He has developed mild anxiety over the past one year. No constipation. Several weeks ago patient saw a homeopathic doctor who evaluated this tremor problem and diagnosed him with a "ganglion problem" likely due to "parasites". He was not prescribed any medications.    REVIEW OF SYSTEMS: Full 14 system review of systems performed and notable only for depression, anxiety, memory loss, tremors.  ALLERGIES: No Known Allergies  HOME MEDICATIONS: Outpatient Prescriptions Prior to Visit  Medication Sig Dispense Refill  . carbidopa-levodopa (SINEMET IR) 25-100 MG per tablet Take 2 tablets by mouth 3 (three) times daily. 180 tablet 12  . cholecalciferol (VITAMIN D) 1000 UNITS tablet Take 1,000 Units by mouth daily.    . fish oil-omega-3 fatty acids 1000 MG capsule Take 2 g by mouth daily.    Marland Kitchen LORazepam (ATIVAN) 0.5 MG tablet Take 1 tablet (0.5 mg total) by mouth every morning. 1 qam 90 tablet 1  . mirtazapine (REMERON) 45 MG tablet Take 1 tablet (45 mg total) by mouth at bedtime. 90 tablet 1  . Multiple Vitamin (MULTIVITAMIN) tablet Take 1 tablet by mouth daily.    Marland Kitchen  saw palmetto 160 MG capsule Take 160 mg by mouth 2 (two) times daily.    . sertraline (ZOLOFT) 100 MG tablet Take 1.5 tablets (150 mg total) by mouth daily. 135 tablet 1  . VALERIAN PO Take 1 tablet by mouth at bedtime as needed and may repeat dose one time if needed.     No facility-administered medications prior to visit.    PAST MEDICAL HISTORY: Past Medical History  Diagnosis Date   . Parkinson's disease 07/2011    followed by [facility] Neurology    PAST SURGICAL HISTORY: Past Surgical History  Procedure Laterality Date  . Thyroid surgery    . Hernia repair      FAMILY HISTORY: Family History  Problem Relation Age of Onset  . Heart failure Mother   . Breast cancer Sister     SOCIAL HISTORY: History   Social History  . Marital Status: Married    Spouse Name: serena  . Number of Children: 0  . Years of Education: 12   Occupational History  . retired    Social History Main Topics  . Smoking status: Never Smoker   . Smokeless tobacco: Never Used  . Alcohol Use: No  . Drug Use: No  . Sexual Activity:    Partners: Female   Other Topics Concern  . Not on file   Social History Narrative     PHYSICAL EXAM  Filed Vitals:   12/12/14 1256  BP: 119/71  Pulse: 81  Height: $Remove'5\' 11"'skhFqgh$  (1.803 m)  Weight: 159 lb (72.122 kg)   Body mass index is 22.19 kg/(m^2).  Generalized: Patient is awake, alert and in no acute distress. Well developed and groomed.  Neck: Supple, no carotid bruits  Cardiac: Regular rate rhythm, no murmur   Neurological examination  Mental Status: Awake, alert. Language is fluent and comprehension intact. POSITIVE MYERSONS. MASKED FACIES.  Cranial Nerves: Pupils are equal and reactive to light. Visual fields are full to confrontation. Conjugate eye movements are full and symmetric. Facial sensation and strength are symmetric. Hearing is intact. Palate elevated symmetrically and uvula is midline. Shoulder shrug is symmetric. Tongue is midline.  Motor: RESTING TREMOR (LUE > RUE > BLE). Normal bulk and MILD COGWHEELING IN LUE. BRADYKINESIA IN LUE AND BLE. MOUTH TREMOR WITH RAM. Full strength in the upper and lower extremities. No pronator drift.  Sensory: Intact and symmetric to light touch, pinprick, temperature, vibration.  Coordination: No ataxia or dysmetria on finger-nose or rapid alternating movement testing.  Reflexes: Deep  tendon reflexes in the upper and lower extremity are present and symmetric. Gait and Station: STOOPED POSTURE. LUE TREMOR WITH WALKING. POOR ARM SWING. SHUFFLING GAIT WITH LEFT FOOT. Tandem gait is UNSTEADY. Romberg is negative.    DIAGNOSTIC DATA (LABS, IMAGING, TESTING)   11/04/11 MRI brain  1. Mild periventricular gliosis. Few punctate non-specific subcortical foci of gliosis. May represent chronic small vessel ischemic disease.  2. No acute findings.    ASSESSMENT AND PLAN  79 y.o. male with progressive resting tremor left greater than right upper extremities, bilateral lower extremities, with cognitive rigidity, bradykinesia, stooped posture and Myerson sign. Mild response to carb/levo.   Dx: idiopathic Parkinson's disease.   PLAN:  I spent 15 minutes of face to face time with patient. Greater than 50% of time was spent in counseling and coordination of care with patient. In summary we discussed:  1. Continue carbidopa-levodopa 2 tabs TID 2. Offered additional meds for better tremor control (ropinirole, pramipexole, neuropro or azilect)  but pt wants to hold off for now 3. Caution with balance, walking and driving  Return in about 1 year (around 12/12/2015).     Penni Bombard, MD 0/10/8401, 3:53 PM Certified in Neurology, Neurophysiology and Neuroimaging  [facility] [address] [place], [place] [address] [phone]

## 2015-01-05 ENCOUNTER — Ambulatory Visit (INDEPENDENT_AMBULATORY_CARE_PROVIDER_SITE_OTHER): Payer: Medicare Other | Admitting: Psychiatry

## 2015-01-05 VITALS — BP 108/74 | HR 88 | Ht 71.0 in | Wt 157.8 lb

## 2015-01-05 DIAGNOSIS — F331 Major depressive disorder, recurrent, moderate: Secondary | ICD-10-CM

## 2015-01-05 DIAGNOSIS — F418 Other specified anxiety disorders: Secondary | ICD-10-CM

## 2015-01-05 DIAGNOSIS — F339 Major depressive disorder, recurrent, unspecified: Secondary | ICD-10-CM | POA: Diagnosis not present

## 2015-01-05 DIAGNOSIS — G4700 Insomnia, unspecified: Secondary | ICD-10-CM

## 2015-01-05 MED ORDER — SERTRALINE HCL 100 MG PO TABS: 150.0000 mg | ORAL_TABLET | Freq: Every day | ORAL | Status: DC

## 2015-01-05 MED ORDER — MIRTAZAPINE 45 MG PO TABS: 45.0000 mg | ORAL_TABLET | Freq: Every day | ORAL | Status: DC

## 2015-01-05 MED ORDER — LORAZEPAM 0.5 MG PO TABS: 0.5000 mg | ORAL_TABLET | Freq: Every morning | ORAL | Status: DC

## 2015-01-05 NOTE — Progress Notes (Signed)
[facility] MD Progress Note  01/05/2015 1:34 PM AKIF WELDY  MRN:  [id number] Subjective: Doing well Principal Problem: Major depression chronic residual Diagnosis:  Major depression chronic, residual At this time the patient is doing very well. His mood is stable. His Parkinson is controlled. He denies daily depression. He complains of some long-term memory problems but no short-term memory problems. He is sleeping and eating well. He's got reasonably good energy. He can concentrate well denies worthlessness and denies being suicidal. He denies auditory nor visual hallucinations. He is no delusional material. He enjoys reading checking emails watching TV. Presently he is dealing with the fact that his sister who lives with him is recovering from breast cancer surgery. Overall though his family is stable. The patient wants to be is cognitively active as possible. He knowledge is that his lack motivation. He admits that he has not been exercising like he should in the past. The patient is resistant to going to any exercise programs in the community. The patient denies any chest pain or shortness of breath. He denies any neurological symptoms other than a mild resting tremor from his Parkinson's. Patient Active Problem List   Diagnosis Date Noted  . Major depressive disorder, recurrent episode, unspecified [F33.9] 08/18/2013  . Major depressive disorder, single episode [F32.9] 06/17/2013  . Mood disorder in conditions classified elsewhere [F06.30] 03/31/2013  . Parkinson's disease [G20] 01/19/2013   Total Time spent with patient: 30 minutes   Past Medical History:  Past Medical History  Diagnosis Date  . Parkinson's disease 07/2011    followed by [facility] Neurology    Past Surgical History  Procedure Laterality Date  . Thyroid surgery    . Hernia repair     Family History:  Family History  Problem Relation Age of Onset  . Heart failure Mother   . Breast cancer Sister    Social History:   History  Alcohol Use No     History  Drug Use No    Social History   Social History  . Marital Status: Married    Spouse Name: serena  . Number of Children: 0  . Years of Education: 12   Occupational History  . retired    Social History Main Topics  . Smoking status: Never Smoker   . Smokeless tobacco: Never Used  . Alcohol Use: No  . Drug Use: No  . Sexual Activity:    Partners: Female   Other Topics Concern  . Not on file   Social History Narrative   Additional History:    Sleep: Good  Appetite:  Good   Assessment:   Musculoskeletal: Strength & Muscle Tone: cogwheel Gait & Station: normal Patient leans: Right   Psychiatric Specialty Exam: Physical Exam  ROS  There were no vitals taken for this visit.There is no weight on file to calculate BMI.  General Appearance: Casual  Eye Contact::  Good  Speech:  Clear and Coherent  Volume:  Normal  Mood:  NA  Affect:  Appropriate  Thought Process:  Coherent  Orientation:  Full (Time, Place, and Person)  Thought Content:  WDL  Suicidal Thoughts:  No  Homicidal Thoughts:  No  Memory:  NA  Judgement:  Good  Insight:  Good  Psychomotor Activity:  NA and Normal  Concentration:  Good  Recall:  Good  Fund of Knowledge:Good  Language: Good  Akathisia:  No  Handed:  Right  AIMS (if indicated):     Assets:  Desire for Improvement  ADL's:  Intact  Cognition: WNL  Sleep:        Current Medications: Current Outpatient Prescriptions  Medication Sig Dispense Refill  . carbidopa-levodopa (SINEMET IR) 25-100 MG per tablet Take 2 tablets by mouth 3 (three) times daily. 180 tablet 12  . cholecalciferol (VITAMIN D) 1000 UNITS tablet Take 1,000 Units by mouth daily.    . fish oil-omega-3 fatty acids 1000 MG capsule Take 2 g by mouth daily.    Marland Kitchen LORazepam (ATIVAN) 0.5 MG tablet Take 1 tablet (0.5 mg total) by mouth every morning. 1 qam 90 tablet 1  . mirtazapine (REMERON) 45 MG tablet Take 1 tablet (45 mg  total) by mouth at bedtime. 90 tablet 1  . Multiple Vitamin (MULTIVITAMIN) tablet Take 1 tablet by mouth daily.    . saw palmetto 160 MG capsule Take 160 mg by mouth 2 (two) times daily.    . sertraline (ZOLOFT) 100 MG tablet Take 1.5 tablets (150 mg total) by mouth daily. 135 tablet 1  . VALERIAN PO Take 1 tablet by mouth at bedtime as needed and may repeat dose one time if needed.     No current facility-administered medications for this visit.    Lab Results: No results found for this or any previous visit (from the past 48 hour(s)).  Physical Findings: AIMS:  , ,  ,  ,    CIWA:    COWS:     Treatment Plan Summary: At this time the patient will continue taking his medications for depression. This includes her Zoloft 150 mg and Remeron. Patient also takes a low-dose of Ativan 0.5 mg. These are medications for his depression and his anxiety. The patient's second problem is that of his Parkinson's raise being treated by local neurologist with Sinemet 3 times a day. This is doing actually quite well as well. The patient is mentally stable. Is emotionally stable. He is no new physical problems at all. He's pleased with the way things are at this time. He'll return to see me in 5 months.   Medical Decision Making:  Self-Limited or Minor (1)     Mordche Hedglin IRVING 01/05/2015, 1:34 PM

## 2015-01-23 DIAGNOSIS — Z79899 Other long term (current) drug therapy: Secondary | ICD-10-CM | POA: Diagnosis not present

## 2015-01-23 DIAGNOSIS — G20 Parkinson's disease: Secondary | ICD-10-CM | POA: Diagnosis not present

## 2015-01-23 DIAGNOSIS — E784 Other hyperlipidemia: Secondary | ICD-10-CM | POA: Diagnosis not present

## 2015-03-09 NOTE — Telephone Encounter (Signed)
Error

## 2015-06-07 ENCOUNTER — Ambulatory Visit (INDEPENDENT_AMBULATORY_CARE_PROVIDER_SITE_OTHER): Payer: Medicare Other | Admitting: Psychiatry

## 2015-06-07 VITALS — BP 108/67 | HR 87 | Ht 71.75 in | Wt 160.2 lb

## 2015-06-07 DIAGNOSIS — F3342 Major depressive disorder, recurrent, in full remission: Secondary | ICD-10-CM | POA: Diagnosis not present

## 2015-06-07 DIAGNOSIS — G4700 Insomnia, unspecified: Secondary | ICD-10-CM

## 2015-06-07 DIAGNOSIS — F418 Other specified anxiety disorders: Secondary | ICD-10-CM

## 2015-06-07 MED ORDER — MIRTAZAPINE 45 MG PO TABS: 45.0000 mg | ORAL_TABLET | Freq: Every day | ORAL | Status: DC

## 2015-06-07 MED ORDER — LORAZEPAM 0.5 MG PO TABS: ORAL_TABLET | ORAL | Status: DC

## 2015-06-07 MED ORDER — SERTRALINE HCL 100 MG PO TABS: 150.0000 mg | ORAL_TABLET | Freq: Every day | ORAL | Status: DC

## 2015-06-07 NOTE — Progress Notes (Signed)
[facility] MD Progress Note  06/07/2015 3:03 PM Lee Martinez  MRN:  [id number] Subjective:  Doing well Principal Problem: Major depression, recurrent episode residual Diagnosis:  Major depression, recurrent episode residual Today the patient is doing well. He is a Restaurant manager, fast food. He did not celebrate Christmas. He had problems with the snowstorm. The patient denies daily depression. His mood is good. He is active and enjoys life. He still able to drive without a problem. He does his own bills and pills. He goes grocery shopping and actually shares cooking. He lives with his wife and his sister-in-law surviving cancer. Overall the patient is in good spirits. He denies any falls. He denies any auditory or visual hallucinations. He is not paranoid. His memory seems pretty good. He's being seen by a local neurologist and being treated with Sinemet. Overall he is positive and optimistic. He is a very spirits and person. He benefits by coming every 5 months for some supportive psychotherapy and to evaluate his medications. His only complaint is that of some mild increased anxiety. He wants to maximize the benefits of his medications. Patient is not suicidal. He is functioning very well. Patient Active Problem List   Diagnosis Date Noted  . Major depressive disorder, recurrent episode, unspecified [F33.9] 08/18/2013  . Major depressive disorder, single episode [F32.9] 06/17/2013  . Mood disorder in conditions classified elsewhere [F06.30] 03/31/2013  . Parkinson's disease ([place]) [G20] 01/19/2013   Total Time spent with patient: 30 minutes  Past Psychiatric History:   Past Medical History:  Past Medical History  Diagnosis Date  . Parkinson's disease 07/2011    followed by [facility]    Past Surgical History  Procedure Laterality Date  . Thyroid surgery    . Hernia repair     Family History:  Family History  Problem Relation Age of Onset  . Heart failure Mother   . Breast cancer Sister     Family Psychiatric  History:  Social History:  History  Alcohol Use No     History  Drug Use No    Social History   Social History  . Marital Status: Married    Spouse Name: serena  . Number of Children: 0  . Years of Education: 12   Occupational History  . retired    Social History Main Topics  . Smoking status: Never Smoker   . Smokeless tobacco: Never Used  . Alcohol Use: No  . Drug Use: No  . Sexual Activity:    Partners: Female   Other Topics Concern  . Not on file   Social History Narrative   Additional Social History:                         Sleep: Good  Appetite:  Good  Current Medications: Current Outpatient Prescriptions  Medication Sig Dispense Refill  . carbidopa-levodopa (SINEMET IR) 25-100 MG per tablet Take 2 tablets by mouth 3 (three) times daily. 180 tablet 12  . cholecalciferol (VITAMIN D) 1000 UNITS tablet Take 1,000 Units by mouth daily.    . fish oil-omega-3 fatty acids 1000 MG capsule Take 2 g by mouth daily.    Marland Kitchen LORazepam (ATIVAN) 0.5 MG tablet 1 BID 180 tablet 1  . mirtazapine (REMERON) 45 MG tablet Take 1 tablet (45 mg total) by mouth at bedtime. 90 tablet 1  . Multiple Vitamin (MULTIVITAMIN) tablet Take 1 tablet by mouth daily.    . saw palmetto 160 MG capsule Take  160 mg by mouth 2 (two) times daily.    . sertraline (ZOLOFT) 100 MG tablet Take 1.5 tablets (150 mg total) by mouth daily. 135 tablet 1  . VALERIAN PO Take 1 tablet by mouth at bedtime as needed and may repeat dose one time if needed.     No current facility-administered medications for this visit.    Lab Results: No results found for this or any previous visit (from the past 48 hour(s)).  Physical Findings: AIMS:  , ,  ,  ,    CIWA:    COWS:     Musculoskeletal: Strength & Muscle Tone: within normal limits Gait & Station: normal Patient leans: N/A  Psychiatric Specialty Exam: ROS  Blood pressure 108/67, pulse 87, height 5' 11.75" (1.822 m),  weight 160 lb 3.2 oz (72.666 kg).Body mass index is 21.89 kg/(m^2).  General Appearance: Casual  Eye Contact::  Good  Speech:  Clear and Coherent  Volume:  Normal  Mood:  Euthymic  Affect:  Congruent  Thought Process:  Coherent  Orientation:  Full (Time, Place, and Person)  Thought Content:  WDL  Suicidal Thoughts:  No  Homicidal Thoughts:  No  Memory:  NA  Judgement:  Good  Insight:  Good  Psychomotor Activity:  Normal  Concentration:  Good  Recall:  Good  Fund of Knowledge:Good  Language: Good  Akathisia:  No  Handed:  Right  AIMS (if indicated):     Assets:  Desire for Improvement  ADL's:  Intact  Cognition: WNL  Sleep:      Treatment Plan Summary: At this time the patient is doing well. He does have some mild anxiety and it's reasonable to increase his Ativan 0.5 mg twice a day. At this time we'll continue taking Zoloft 150 mg and Remeron 30 mg. The patient is not suicidal. He does have a parkinsonian tremor that continues. This patient to return to see me in 5 months for a 30 minute visit.  [name], [place] 06/07/2015, 3:03 PM

## 2015-06-22 DIAGNOSIS — D224 Melanocytic nevi of scalp and neck: Secondary | ICD-10-CM | POA: Diagnosis not present

## 2015-06-22 DIAGNOSIS — D1801 Hemangioma of skin and subcutaneous tissue: Secondary | ICD-10-CM | POA: Diagnosis not present

## 2015-06-22 DIAGNOSIS — L821 Other seborrheic keratosis: Secondary | ICD-10-CM | POA: Diagnosis not present

## 2015-06-22 DIAGNOSIS — Z85828 Personal history of other malignant neoplasm of skin: Secondary | ICD-10-CM | POA: Diagnosis not present

## 2015-06-22 DIAGNOSIS — L570 Actinic keratosis: Secondary | ICD-10-CM | POA: Diagnosis not present

## 2015-06-27 ENCOUNTER — Ambulatory Visit (INDEPENDENT_AMBULATORY_CARE_PROVIDER_SITE_OTHER): Payer: Medicare Other | Admitting: Family Medicine

## 2015-06-27 VITALS — BP 107/71 | HR 96 | Temp 98.9°F | Resp 16 | Ht 70.0 in | Wt 159.4 lb

## 2015-06-27 DIAGNOSIS — J029 Acute pharyngitis, unspecified: Secondary | ICD-10-CM | POA: Diagnosis not present

## 2015-06-27 MED ORDER — AMOXICILLIN-POT CLAVULANATE 875-125 MG PO TABS: 1.0000 | ORAL_TABLET | Freq: Two times a day (BID) | ORAL | Status: DC

## 2015-06-27 NOTE — Patient Instructions (Signed)

## 2015-06-27 NOTE — Progress Notes (Signed)
Patient ID: Lee Martinez MRN: [id number], DOB: 12/03/1935, 80 y.o. Date of Encounter: 06/27/2015, 1:27 PM  Primary Physician: Maximino Greenland, MD  Chief Complaint:  Chief Complaint  Patient presents with  . throat pain    HPI: 80 y.o. year old male presents with 2 day history of sore throat. Subjective fever and chills. No cough, congestion, rhinorrhea, sinus pressure, otalgia, or headache. Normal hearing. No GI complaints. Able to swallow saliva, but hurts to do so. Decreased appetite secondary to sore throat.   Past Medical History  Diagnosis Date  . Parkinson's disease ([place]) 07/2011    followed by [id number] Neurology     Home Meds: Prior to Admission medications   Medication Sig Start Date End Date Taking? Authorizing Provider  carbidopa-levodopa (SINEMET IR) 25-100 MG per tablet Take 2 tablets by mouth 3 (three) times daily. 12/12/14  Yes Penni Bombard, MD  cholecalciferol (VITAMIN D) 1000 UNITS tablet Take 1,000 Units by mouth daily.   Yes Historical Provider, MD  LORazepam (ATIVAN) 0.5 MG tablet 1 BID 06/07/15  Yes Norma Fredrickson, MD  mirtazapine (REMERON) 45 MG tablet Take 1 tablet (45 mg total) by mouth at bedtime. 06/07/15  Yes Norma Fredrickson, MD  Multiple Vitamin (MULTIVITAMIN) tablet Take 1 tablet by mouth daily.   Yes Historical Provider, MD  saw palmetto 160 MG capsule Take 160 mg by mouth 2 (two) times daily.   Yes Historical Provider, MD  sertraline (ZOLOFT) 100 MG tablet Take 1.5 tablets (150 mg total) by mouth daily. 06/07/15  Yes Norma Fredrickson, MD  VALERIAN PO Take 1 tablet by mouth at bedtime as needed and may repeat dose one time if needed.   Yes Historical Provider, MD  amoxicillin-clavulanate (AUGMENTIN) 875-125 MG tablet Take 1 tablet by mouth 2 (two) times daily. 06/27/15   Robyn Haber, MD  fish oil-omega-3 fatty acids 1000 MG capsule Take 2 g by mouth daily.    Historical Provider, MD    Allergies: No Known Allergies  Social History   Social  History  . Marital Status: Married    Spouse Name: serena  . Number of Children: 0  . Years of Education: 12   Occupational History  . retired    Social History Main Topics  . Smoking status: Never Smoker   . Smokeless tobacco: Never Used  . Alcohol Use: No  . Drug Use: No  . Sexual Activity:    Partners: Female   Other Topics Concern  . Not on file   Social History Narrative     Review of Systems: Constitutional: negative for chills, fever, night sweats or weight changes HEENT: see above Cardiovascular: negative for chest pain or palpitations Respiratory: negative for hemoptysis, wheezing, or shortness of breath Abdominal: negative for abdominal pain, nausea, vomiting or diarrhea Dermatological: negative for rash Neurologic: negative for headache   Physical Exam: Blood pressure 107/71, pulse 96, temperature 98.9 F (37.2 C), temperature source Oral, resp. rate 16, height 5\' 10"  (1.778 m), weight 159 lb 6.4 oz (72.303 kg), SpO2 95 %., Body mass index is 22.87 kg/(m^2). General: Well developed, well nourished, in no acute distress. Head: Normocephalic, atraumatic, eyes without discharge, sclera non-icteric, nares are patent. Bilateral auditory canals clear, TM's are without perforation, pearly grey with reflective cone of light bilaterally. No sinus TTP. Oral cavity moist, dentition normal. Posterior pharynx with post nasal drip and mild erythema. No peritonsillar abscess or tonsillar exudate. Neck: Supple. No thyromegaly. Full ROM. No lymphadenopathy. Lungs: Clear  bilaterally to auscultation without wheezes, rales, or rhonchi. Breathing is unlabored. Heart: RRR with S1 S2. No murmurs, rubs, or gallops appreciated. Abdomen: Soft, non-tender, non-distended with normoactive bowel sounds. No hepatomegaly. No rebound/guarding. No obvious abdominal masses. Msk:  Strength and tone normal for age. Extremities: No clubbing or cyanosis. No edema. Neuro: Alert and oriented X 3. Moves  all extremities spontaneously. CNII-XII grossly in tact. Psych:  Responds to questions appropriately with a normal affect.     ASSESSMENT AND PLAN:  80 y.o. year old male with acute pharyngitis Acute pharyngitis, unspecified etiology - Plan: amoxicillin-clavulanate (AUGMENTIN) 875-125 MG tablet   - -Tylenol/Motrin prn -Rest/fluids -RTC precautions -RTC 3-5 days if no improvement  Signed, Robyn Haber, MD 06/27/2015 1:27 PM

## 2015-07-21 DIAGNOSIS — E784 Other hyperlipidemia: Secondary | ICD-10-CM | POA: Diagnosis not present

## 2015-07-21 DIAGNOSIS — G20 Parkinson's disease: Secondary | ICD-10-CM | POA: Diagnosis not present

## 2015-07-21 DIAGNOSIS — Z79899 Other long term (current) drug therapy: Secondary | ICD-10-CM | POA: Diagnosis not present

## 2015-07-21 DIAGNOSIS — Z0000 Encounter for general adult medical examination without abnormal findings: Secondary | ICD-10-CM | POA: Diagnosis not present

## 2015-11-08 ENCOUNTER — Encounter (HOSPITAL_COMMUNITY): Payer: Self-pay | Admitting: Psychiatry

## 2015-11-08 ENCOUNTER — Ambulatory Visit (INDEPENDENT_AMBULATORY_CARE_PROVIDER_SITE_OTHER): Payer: Medicare Other | Admitting: Psychiatry

## 2015-11-08 VITALS — BP 110/64 | HR 92 | Ht 71.75 in | Wt 158.2 lb

## 2015-11-08 DIAGNOSIS — F418 Other specified anxiety disorders: Secondary | ICD-10-CM | POA: Diagnosis not present

## 2015-11-08 DIAGNOSIS — F3342 Major depressive disorder, recurrent, in full remission: Secondary | ICD-10-CM | POA: Diagnosis not present

## 2015-11-08 DIAGNOSIS — G4700 Insomnia, unspecified: Secondary | ICD-10-CM | POA: Diagnosis not present

## 2015-11-08 MED ORDER — LORAZEPAM 0.5 MG PO TABS
ORAL_TABLET | ORAL | Status: DC
Start: 2015-11-08 — End: 2016-04-10

## 2015-11-08 MED ORDER — SERTRALINE HCL 100 MG PO TABS: 150.0000 mg | ORAL_TABLET | Freq: Every day | ORAL | Status: DC

## 2015-11-08 MED ORDER — MIRTAZAPINE 45 MG PO TABS: 45.0000 mg | ORAL_TABLET | Freq: Every day | ORAL | Status: DC

## 2015-11-08 NOTE — Progress Notes (Signed)
Patient ID: Lee Martinez, male   DOB: Apr 08, 1936, 80 y.o.   MRN: [id number] [facility] MD Progress Note  11/08/2015 1:34 PM NARINDER DOTTS  MRN:  [id number] Subjective:  Doing well Principal Problem: Major depression, recurrent episode residual Diagnosis:  Major depression, recurrent episode residual today the patient is stable. Is on time. His mood is good. His wife is stable. The patient lives with his sister who is doing only fairly well. The patient is sleeping and eating very well. He feels unmotivated. He does have a reduction in energy and feels a lot of fatigue. His anxiety level is improved after we increased his Ativan was last visit. The patient is able to concentrate well and reads a lot. Watches TV. Is not suicidal. Patient denies the use of alcohol. He is a Restaurant manager, fast food. He is waiting for the end of the world. The patient shows no evidence of psychosis. He is being treated for Parkinson's but it's really well controlled. He shows no evidence of a tremor. He shows some degree of bradykinesia but it's minimal. The patient doesn't feel spontaneous about things and doesn't feel motivated but overall denies being depressed. Importantly he denies visual hallucinations or paranoia. The patient comes every 5 months to check in and for some supportive psychotherapy. It's important for him to hear that is on the maximum dose adequate amount medicines which I think he is. The patient is not oversedated. He's had no falls. He denies chest pain or shortness of breath. He denies focal neurological symptoms. Patient Active Problem List   Diagnosis Date Noted  . Major depressive disorder, recurrent episode, unspecified [F33.9] 08/18/2013  . Major depressive disorder, single episode [F32.9] 06/17/2013  . Mood disorder in conditions classified elsewhere [F06.30] 03/31/2013  . Parkinson's disease ([place]) [G20] 01/19/2013   Total Time spent with patient: 30 minutes  Past Psychiatric History:   Past Medical  History:  Past Medical History  Diagnosis Date  . Parkinson's disease ([place]) 07/2011    followed by [facility] Neurology    Past Surgical History  Procedure Laterality Date  . Thyroid surgery    . Hernia repair     Family History:  Family History  Problem Relation Age of Onset  . Heart failure Mother   . Breast cancer Sister    Family Psychiatric  History:  Social History:  History  Alcohol Use No     History  Drug Use No    Social History   Social History  . Marital Status: Married    Spouse Name: serena  . Number of Children: 0  . Years of Education: 12   Occupational History  . retired    Social History Main Topics  . Smoking status: Never Smoker   . Smokeless tobacco: Never Used  . Alcohol Use: No  . Drug Use: No  . Sexual Activity:    Partners: Female   Other Topics Concern  . None   Social History Narrative   Additional Social History:                         Sleep: Good  Appetite:  Good  Current Medications: Current Outpatient Prescriptions  Medication Sig Dispense Refill  . amoxicillin-clavulanate (AUGMENTIN) 875-125 MG tablet Take 1 tablet by mouth 2 (two) times daily. 20 tablet 0  . carbidopa-levodopa (SINEMET IR) 25-100 MG per tablet Take 2 tablets by mouth 3 (three) times daily. 180 tablet 12  . cholecalciferol (VITAMIN D)  1000 UNITS tablet Take 1,000 Units by mouth daily.    . fish oil-omega-3 fatty acids 1000 MG capsule Take 2 g by mouth daily.    Marland Kitchen LORazepam (ATIVAN) 0.5 MG tablet 1 BID 180 tablet 5  . mirtazapine (REMERON) 45 MG tablet Take 1 tablet (45 mg total) by mouth at bedtime. 90 tablet 1  . Multiple Vitamin (MULTIVITAMIN) tablet Take 1 tablet by mouth daily.    . saw palmetto 160 MG capsule Take 160 mg by mouth 2 (two) times daily.    . sertraline (ZOLOFT) 100 MG tablet Take 1.5 tablets (150 mg total) by mouth daily. 135 tablet 1  . VALERIAN PO Take 1 tablet by mouth at bedtime as needed and may repeat dose one time if  needed.     No current facility-administered medications for this visit.    Lab Results: No results found for this or any previous visit (from the past 48 hour(s)).  Physical Findings: AIMS:  , ,  ,  ,    CIWA:    COWS:     Musculoskeletal: Strength & Muscle Tone: within normal limits Gait & Station: normal Patient leans: N/A  Psychiatric Specialty Exam: ROS  Blood pressure 110/64, pulse 92, height 5' 11.75" (1.822 m), weight 158 lb 3.2 oz (71.759 kg).Body mass index is 21.62 kg/(m^2).  General Appearance: Casual  Eye Contact::  Good  Speech:  Clear and Coherent  Volume:  Normal  Mood:  Euthymic  Affect:  Congruent  Thought Process:  Coherent  Orientation:  Full (Time, Place, and Person)  Thought Content:  WDL  Suicidal Thoughts:  No  Homicidal Thoughts:  No  Memory:  NA  Judgement:  Good  Insight:  Good  Psychomotor Activity:  Normal  Concentration:  Good  Recall:  Good  Fund of Knowledge:Good  Language: Good  Akathisia:  No  Handed:  Right  AIMS (if indicated):     Assets:  Desire for Improvement  ADL's:  Intact  Cognition: WNL  Sleep:      Treatment Plan Summary: Today the patient will continue taking Ativan 0.5 mg twice a day. He feels a distinct improvement. The patient will continue taking the 150 mg of Zoloft and continue Remeron at night. The combination of all 3 these medicines seem to reduce his anxiety and his depression. He shows no evidence of vegetative symptoms other than some fatigue which I suspect is appropriate for his age and for Parkinson's. Overall he is medically stable and psychiatrically stable. Is not suicidal. Charlestine Night [facility] 11/08/2015, 1:34 PM

## 2015-11-28 ENCOUNTER — Other Ambulatory Visit: Payer: Self-pay | Admitting: Diagnostic Neuroimaging

## 2015-12-14 ENCOUNTER — Encounter: Payer: Self-pay | Admitting: Diagnostic Neuroimaging

## 2015-12-14 ENCOUNTER — Ambulatory Visit (INDEPENDENT_AMBULATORY_CARE_PROVIDER_SITE_OTHER): Payer: Medicare Other | Admitting: Diagnostic Neuroimaging

## 2015-12-14 VITALS — BP 100/68 | HR 83 | Wt 158.2 lb

## 2015-12-14 DIAGNOSIS — F411 Generalized anxiety disorder: Secondary | ICD-10-CM

## 2015-12-14 DIAGNOSIS — G20 Parkinson's disease: Secondary | ICD-10-CM | POA: Diagnosis not present

## 2015-12-14 DIAGNOSIS — F329 Major depressive disorder, single episode, unspecified: Secondary | ICD-10-CM

## 2015-12-14 DIAGNOSIS — F32A Depression, unspecified: Secondary | ICD-10-CM

## 2015-12-14 MED ORDER — CARBIDOPA-LEVODOPA 25-100 MG PO TABS: 2.0000 | ORAL_TABLET | Freq: Three times a day (TID) | ORAL | 4 refills | Status: DC

## 2015-12-14 NOTE — Patient Instructions (Signed)
Continue current medications. 

## 2015-12-14 NOTE — Progress Notes (Addendum)
PATIENT: Lee Martinez DOB: 1935-11-23   REASON FOR VISIT: routine follow up for Parkinson's Disease HISTORY FROM: patient  Chief Complaint  Patient presents with  . Other    rm 7,  Parkinson's Disease, " no changes since last year"  . Follow-up    one year     HISTORY OF PRESENT ILLNESS:  UPDATE 12/14/15: Since last visit, symptoms are stable. No change in balance, gait, swallow, depression or anxiety. Tolerating meds.   UPDATE 12/12/14: Since last visit, symptoms are stable. No falls. No wearing off. Minimal benefit with carb/levo.   UPDATE 06/13/14: Since last visit, doing about the same. No wearing off. Anixety is still his main problem, but feels better than before.   UPDATE 02/10/14: Since last visit, tremor and anxiety are persistent. Some wearing off before doses. Incomplete tremor control with each dose. No peak dyskinesias.   UPDATE 04/26/13 (LL): Since last visit, patient met with psychiatrist and his Remeron was increased.  He reports that his mood and anxiety is better.  He is sleeping better with aid from [facility].  Denies problems with Sinemet wearing off, constipation, urinary frequency, or balance problems.   UPDATE 01/19/13: Since last visit, patient did not followup. He's not sure what happened. Patient's main complaint today is anxiety and sleep problems. He has been taking mirtazapine for several months without relief. In the past he did not express interest in SSRI or other medication. Now he is more amenable to these medications. He's never seen psychiatry.  Regarding tremor, stiffness and slowness of muscles, he feels the symptoms are stable. Carbidopa/levodopa seems to help a little bit. His wife has not noticed much difference. Patient did not go up on carbidopa/levodopa dosing last time.   UPDATE 12/04/11: Doing about the same. Not much benefit on carb/levo, although has only been on 1 tab TID x 1.5 weeks. No side effects. Asking about acupuncture. Having  some anxiety and sleep problems. Not interested in SSRI or other meds now.   PRIOR HPI: 80 year old right-handed male with history of gastric ulcer, here for evaluation of tremor. Patient reports tremor in the left upper extremity starting 3 years ago. Now tremor affecting his right upper extremity and legs. Denies voice or chin tremor. Tremor mainly affects him when he has stressed or tired. He describes a primarily resting tremor which improves with action. Patient denies dizziness but has mild uneasiness with walking. No change in sense of smell or taste. No reports of acting out dreams or REM behavior disorder symptoms. He has developed mild anxiety over the past one year. No constipation. Several weeks ago patient saw a homeopathic doctor who evaluated this tremor problem and diagnosed him with a "ganglion problem" likely due to "parasites". He was not prescribed any medications.    REVIEW OF SYSTEMS: Full 14 system review of systems performed and notable only for depression, anxiety, tremors.  ALLERGIES: No Known Allergies  HOME MEDICATIONS: Outpatient Medications Prior to Visit  Medication Sig Dispense Refill  . cholecalciferol (VITAMIN D) 1000 UNITS tablet Take 1,000 Units by mouth daily.    . fish oil-omega-3 fatty acids 1000 MG capsule Take 2 g by mouth daily.    Marland Kitchen LORazepam (ATIVAN) 0.5 MG tablet 1 BID 180 tablet 5  . mirtazapine (REMERON) 45 MG tablet Take 1 tablet (45 mg total) by mouth at bedtime. 90 tablet 1  . Multiple Vitamin (MULTIVITAMIN) tablet Take 1 tablet by mouth daily.    . saw palmetto  160 MG capsule Take 160 mg by mouth 2 (two) times daily.    . sertraline (ZOLOFT) 100 MG tablet Take 1.5 tablets (150 mg total) by mouth daily. 135 tablet 1  . VALERIAN PO Take 1 tablet by mouth at bedtime as needed and may repeat dose one time if needed.    . carbidopa-levodopa (SINEMET IR) 25-100 MG tablet Take 2 tablets by mouth 3  times daily 540 tablet 0  . amoxicillin-clavulanate  (AUGMENTIN) 875-125 MG tablet Take 1 tablet by mouth 2 (two) times daily. 20 tablet 0   No facility-administered medications prior to visit.     PAST MEDICAL HISTORY: Past Medical History:  Diagnosis Date  . Parkinson's disease (HCC) 07/2011   followed by [facility]    PAST SURGICAL HISTORY: Past Surgical History:  Procedure Laterality Date  . HERNIA REPAIR    . THYROID SURGERY      FAMILY HISTORY: Family History  Problem Relation Age of Onset  . Heart failure Mother   . Breast cancer Sister     SOCIAL HISTORY: Social History   Social History  . Marital status: Married    Spouse name: serena  . Number of children: 0  . Years of education: 12   Occupational History  . retired Retired   Social History Main Topics  . Smoking status: Never Smoker  . Smokeless tobacco: Never Used  . Alcohol use No  . Drug use: No  . Sexual activity: Yes    Partners: Female   Other Topics Concern  . Not on file   Social History Narrative  . No narrative on file     PHYSICAL EXAM  Vitals:   12/14/15 1254  BP: 100/68  Pulse: 83  Weight: 158 lb 3.2 oz (71.8 kg)   Body mass index is 21.61 kg/m.  Generalized: Patient is awake, alert and in no acute distress. Well developed and groomed.  Neck: Supple, no carotid bruits  Cardiac: Regular rate rhythm, no murmur   Neurological examination  Mental Status: Awake, alert. Language is fluent and comprehension intact. POSITIVE MYERSONS. MASKED FACIES.  Cranial Nerves: Pupils are equal and reactive to light. Visual fields are full to confrontation. Conjugate eye movements are full and symmetric. Facial sensation and strength are symmetric. Hearing is intact. Palate elevated symmetrically and uvula is midline. Shoulder shrug is symmetric. Tongue is midline.  Motor: RESTING TREMOR (LUE > RUE > BLE). Normal bulk and MILD COGWHEELING IN LUE. BRADYKINESIA IN LUE AND BLE. MOUTH TREMOR WITH RAM. Full strength in the upper and lower  extremities. No pronator drift.  Sensory: Intact and symmetric to light touch, pinprick, temperature, vibration.  Coordination: No ataxia or dysmetria on finger-nose or rapid alternating movement testing.  Reflexes: Deep tendon reflexes in the upper and lower extremity are present and symmetric. Gait and Station: STOOPED POSTURE. LUE TREMOR WITH WALKING. POOR ARM SWING. LEFT ARM TREMOR WITH WALKING. SHUFFLING GAIT WITH LEFT FOOT.   DIAGNOSTIC DATA (LABS, IMAGING, TESTING)   11/04/11 MRI brain  1. Mild periventricular gliosis. Few punctate non-specific subcortical foci of gliosis. May represent chronic small vessel ischemic disease.  2. No acute findings.    ASSESSMENT AND PLAN  79 y.o. male with progressive resting tremor left greater than right upper extremities, bilateral lower extremities, with cognitive rigidity, bradykinesia, stooped posture and Myerson sign. Mild response to carb/levo. Depression/anxiety stable.    Dx: idiopathic Parkinson's disease.   Parkinson's disease (HCC)  Depression  Anxiety state    PLAN:    I spent 15 minutes of face to face time with patient. Greater than 50% of time was spent in counseling and coordination of care with patient. In summary we discussed:  - continue carbidopa-levodopa 2 tabs TID - offered additional meds for better tremor control (ropinirole, pramipexole, neuropro or azilect) but pt wants to hold off for now - caution with balance, walking and driving - continue sertraline for depression/anxiety  Meds ordered this encounter  Medications  . carbidopa-levodopa (SINEMET IR) 25-100 MG tablet    Sig: Take 2 tablets by mouth 3 (three) times daily.    Dispense:  540 tablet    Refill:  4   Return in about 1 year (around 12/13/2016).      R. , MD 12/14/2015, 1:21 PM Certified in Neurology, Neurophysiology and Neuroimaging  [facility] [address], [address] [place], [place] [address] [phone]  

## 2015-12-21 ENCOUNTER — Other Ambulatory Visit (HOSPITAL_COMMUNITY): Payer: Self-pay | Admitting: Psychiatry

## 2015-12-21 DIAGNOSIS — G4700 Insomnia, unspecified: Secondary | ICD-10-CM

## 2015-12-21 DIAGNOSIS — F418 Other specified anxiety disorders: Secondary | ICD-10-CM

## 2016-01-19 DIAGNOSIS — G20 Parkinson's disease: Secondary | ICD-10-CM | POA: Diagnosis not present

## 2016-01-19 DIAGNOSIS — Z79899 Other long term (current) drug therapy: Secondary | ICD-10-CM | POA: Diagnosis not present

## 2016-01-19 DIAGNOSIS — E784 Other hyperlipidemia: Secondary | ICD-10-CM | POA: Diagnosis not present

## 2016-02-08 ENCOUNTER — Other Ambulatory Visit (HOSPITAL_COMMUNITY): Payer: Self-pay | Admitting: Psychiatry

## 2016-02-08 DIAGNOSIS — G4700 Insomnia, unspecified: Secondary | ICD-10-CM

## 2016-02-08 DIAGNOSIS — H31092 Other chorioretinal scars, left eye: Secondary | ICD-10-CM | POA: Diagnosis not present

## 2016-02-08 DIAGNOSIS — F418 Other specified anxiety disorders: Secondary | ICD-10-CM

## 2016-02-08 DIAGNOSIS — H04123 Dry eye syndrome of bilateral lacrimal glands: Secondary | ICD-10-CM | POA: Diagnosis not present

## 2016-02-08 DIAGNOSIS — H2513 Age-related nuclear cataract, bilateral: Secondary | ICD-10-CM | POA: Diagnosis not present

## 2016-04-10 ENCOUNTER — Ambulatory Visit (INDEPENDENT_AMBULATORY_CARE_PROVIDER_SITE_OTHER): Payer: Medicare Other | Admitting: Psychiatry

## 2016-04-10 ENCOUNTER — Encounter (HOSPITAL_COMMUNITY): Payer: Self-pay | Admitting: Psychiatry

## 2016-04-10 VITALS — BP 98/60 | HR 96 | Ht 71.75 in | Wt 158.6 lb

## 2016-04-10 DIAGNOSIS — Z9889 Other specified postprocedural states: Secondary | ICD-10-CM | POA: Diagnosis not present

## 2016-04-10 DIAGNOSIS — Z8249 Family history of ischemic heart disease and other diseases of the circulatory system: Secondary | ICD-10-CM

## 2016-04-10 DIAGNOSIS — F325 Major depressive disorder, single episode, in full remission: Secondary | ICD-10-CM

## 2016-04-10 DIAGNOSIS — Z803 Family history of malignant neoplasm of breast: Secondary | ICD-10-CM

## 2016-04-10 DIAGNOSIS — F418 Other specified anxiety disorders: Secondary | ICD-10-CM

## 2016-04-10 DIAGNOSIS — Z79899 Other long term (current) drug therapy: Secondary | ICD-10-CM

## 2016-04-10 MED ORDER — SERTRALINE HCL 100 MG PO TABS: 150.0000 mg | ORAL_TABLET | Freq: Every day | ORAL | 5 refills | Status: DC

## 2016-04-10 MED ORDER — MIRTAZAPINE 45 MG PO TABS: 45.0000 mg | ORAL_TABLET | Freq: Every day | ORAL | 0 refills | Status: DC

## 2016-04-10 MED ORDER — LORAZEPAM 0.5 MG PO TABS: ORAL_TABLET | ORAL | 5 refills | Status: DC

## 2016-04-10 NOTE — Progress Notes (Signed)
Patient ID: Lee Martinez, male   DOB: 1935/12/28, 80 y.o.   MRN: [id number] [facility] MD Progress Note  04/10/2016 3:08 PM Lee Martinez  MRN:  [id number] Subjective:  Doing well Principal Problem: Major depression, recurrent episode residual Diagnosis:  Major depression, recurrent episode residual No  chamges at this time the patient is doing well. Patient is sleeping and eating well. He's baseline. He is not suicidal. He takes his Parkinson's medicines here he does well. His energy level is good. He reads is much as he Is very spiritual. Patient taking his medicines as prescribed. Patient denies chest pain or shortness of breath physically very helpful. Patient Active Problem List   Diagnosis Date Noted  . Major depressive disorder, recurrent episode, unspecified [F33.9] 08/18/2013  . Major depressive disorder, single episode [F32.9] 06/17/2013  . Mood disorder in conditions classified elsewhere [F06.30] 03/31/2013  . Parkinson's disease ([place]) [G20] 01/19/2013   Total Time spent with patient: 30 minutes  Past Psychiatric History:   Past Medical History:  Past Medical History:  Diagnosis Date  . Parkinson's disease ([place]) 07/2011   followed by [facility]    Past Surgical History:  Procedure Laterality Date  . HERNIA REPAIR    . THYROID SURGERY     Family History:  Family History  Problem Relation Age of Onset  . Heart failure Mother   . Breast cancer Sister    Family Psychiatric  History:  Social History:  History  Alcohol Use  . 0.6 oz/week  . 1 Shots of liquor per week     History  Drug Use No    Social History   Social History  . Marital status: Married    Spouse name: serena  . Number of children: 0  . Years of education: 12   Occupational History  . retired Retired   Social History Main Topics  . Smoking status: Never Smoker  . Smokeless tobacco: Never Used  . Alcohol use 0.6 oz/week    1 Shots of liquor per week  . Drug use: No  . Sexual activity:  Not Currently    Partners: Female   Other Topics Concern  . None   Social History Narrative  . None   Additional Social History:                         Sleep: Good  Appetite:  Good  Current Medications: Current Outpatient Prescriptions  Medication Sig Dispense Refill  . carbidopa-levodopa (SINEMET IR) 25-100 MG tablet Take 2 tablets by mouth 3 (three) times daily. 540 tablet 4  . cholecalciferol (VITAMIN D) 1000 UNITS tablet Take 1,000 Units by mouth daily.    . fish oil-omega-3 fatty acids 1000 MG capsule Take 2 g by mouth daily.    Marland Kitchen LORazepam (ATIVAN) 0.5 MG tablet 1 BID 180 tablet 5  . mirtazapine (REMERON) 45 MG tablet Take 1 tablet (45 mg total) by mouth at bedtime. 90 tablet 1  . mirtazapine (REMERON) 45 MG tablet Take 1 tablet (45 mg total) by mouth at bedtime. 90 tablet 0  . Multiple Vitamin (MULTIVITAMIN) tablet Take 1 tablet by mouth daily.    . saw palmetto 160 MG capsule Take 160 mg by mouth 2 (two) times daily.    . sertraline (ZOLOFT) 100 MG tablet TAKE 1 AND 1/2 TABLETS BY  MOUTH DAILY 135 tablet 0  . sertraline (ZOLOFT) 100 MG tablet Take 1.5 tablets (150 mg total) by mouth daily.  135 tablet 5  . UNABLE TO FIND Med Name: [name], natural herb    . VALERIAN PO Take 1 tablet by mouth at bedtime as needed and may repeat dose one time if needed.     No current facility-administered medications for this visit.     Lab Results: No results found for this or any previous visit (from the past 48 hour(s)).  Physical Findings: AIMS:  , ,  ,  ,    CIWA:    COWS:     Musculoskeletal: Strength & Muscle Tone: within normal limits Gait & Station: normal Patient leans: N/A  Psychiatric Specialty Exam: ROS  Blood pressure 98/60, pulse 96, height 5' 11.75" (1.822 m), weight 158 lb 9.6 oz (71.9 kg).Body mass index is 21.66 kg/m.  General Appearance: Casual  Eye Contact::  Good  Speech:  Clear and Coherent  Volume:  Normal  Mood:  Euthymic  Affect:   Congruent  Thought Process:  Coherent  Orientation:  Full (Time, Place, and Person)  Thought Content:  WDL  Suicidal Thoughts:  No  Homicidal Thoughts:  No  Memory:  NA  Judgement:  Good  Insight:  Good  Psychomotor Activity:  Normal  Concentration:  Good  Recall:  Good  Fund of Knowledge:Good  Language: Good  Akathisia:  No  Handed:  Right  AIMS (if indicated):     Assets:  Desire for Improvement  ADL's:  Intact  Cognition: WNL  Sleep:      Treatment Plan Summary: 04/10/2016, 3:08 PM At this time the patient will continue taking all the medications prescribed. This includes Ativan Remeron and Zoloft. The patient return to see me in 6 months. Patient is not suicidal. He demonstrates no psychosis physically is quite well.

## 2016-05-27 ENCOUNTER — Encounter: Payer: Self-pay | Admitting: Physician Assistant

## 2016-05-27 ENCOUNTER — Ambulatory Visit (INDEPENDENT_AMBULATORY_CARE_PROVIDER_SITE_OTHER): Payer: Medicare Other

## 2016-05-27 ENCOUNTER — Ambulatory Visit (INDEPENDENT_AMBULATORY_CARE_PROVIDER_SITE_OTHER): Payer: Medicare Other | Admitting: Physician Assistant

## 2016-05-27 VITALS — BP 112/65 | HR 97 | Temp 98.6°F | Resp 16 | Ht 71.0 in | Wt 153.0 lb

## 2016-05-27 DIAGNOSIS — R05 Cough: Secondary | ICD-10-CM | POA: Diagnosis not present

## 2016-05-27 DIAGNOSIS — R0989 Other specified symptoms and signs involving the circulatory and respiratory systems: Secondary | ICD-10-CM | POA: Diagnosis not present

## 2016-05-27 DIAGNOSIS — R059 Cough, unspecified: Secondary | ICD-10-CM

## 2016-05-27 MED ORDER — DOXYCYCLINE HYCLATE 100 MG PO CAPS: 100.0000 mg | ORAL_CAPSULE | Freq: Two times a day (BID) | ORAL | 0 refills | Status: DC

## 2016-05-27 NOTE — Progress Notes (Signed)
05/29/2016 3:37 PM   DOB: Nov 29, 1935 / MRN: [id number]  SUBJECTIVE:  Lee Martinez is a 81 y.o. male presenting for cough and "glands" that started roughly 2.5 weeks ago. He felt that his "glands" improved about 10 days ago. He denies SOB, pleuritic pain, chest pain, leg swelling, no new DOE. He denies URI symptoms at this time, but did have some nasal congestion with gland pain.   He has No Known Allergies.   He  has a past medical history of Parkinson's disease ([place]) (07/2011).    He  reports that he has never smoked. He has never used smokeless tobacco. He reports that he drinks about 0.6 oz of alcohol per week . He reports that he does not use drugs. He  reports that he does not currently engage in sexual activity but has had male partners. The patient  has a past surgical history that includes Thyroid surgery and Hernia repair.  His family history includes Breast cancer in his sister; Heart failure in his mother.  Review of Systems  Constitutional: Negative for chills and fever.  Skin: Negative for itching and rash.    The problem list and medications were reviewed and updated by myself where necessary and exist elsewhere in the encounter.   OBJECTIVE:  BP 112/65   Pulse 97   Temp 98.6 F (37 C) (Oral)   Resp 16   Ht _0  (1.803 m)   Wt 153 lb (69.4 kg)   SpO2 98%   BMI 21.34 kg/m   Physical Exam  Constitutional: He is oriented to person, place, and time. He appears well-developed. He does not appear ill.  Eyes: Conjunctivae and EOM are normal. Pupils are equal, round, and reactive to light.  Cardiovascular: Normal rate and regular rhythm.   Pulmonary/Chest: Effort normal. No respiratory distress. He has no wheezes. He has rales (right middle lobe). He exhibits no tenderness.  Abdominal: He exhibits no distension.  Musculoskeletal: Normal range of motion.  Neurological: He is alert and oriented to person, place, and time. No cranial nerve deficit. Coordination normal.   Skin: Skin is warm and dry. He is not diaphoretic.  Psychiatric: He has a normal mood and affect.  Nursing note and vitals reviewed.     Results for orders placed or performed in visit on 05/27/16 (from the past 72 hour(s))  CBC     Status: None   Collection Time: 05/27/16  4:52 PM  Result Value Ref Range   WBC 5.8 3.4 - 10.8 x10E3/uL   RBC 4.42 4.14 - 5.80 x10E6/uL   Hemoglobin 13.2 13.0 - 17.7 g/dL   Hematocrit 38.3 37.5 - 51.0 %   MCV 87 79 - 97 fL   MCH 29.9 26.6 - 33.0 pg   MCHC 34.5 31.5 - 35.7 g/dL   RDW 13.7 12.3 - 15.4 %   Platelets 195 150 - 379 x10E3/uL  CMP14+EGFR     Status: Abnormal   Collection Time: 05/27/16  4:52 PM  Result Value Ref Range   Glucose 108 (H) 65 - 99 mg/dL   BUN 25 8 - 27 mg/dL   Creatinine, Ser 1.11 0.76 - 1.27 mg/dL   GFR calc non Af Amer 63 >59 mL/min/1.73   GFR calc Af Amer 73 >59 mL/min/1.73   BUN/Creatinine Ratio 23 10 - 24   Sodium 142 134 - 144 mmol/L   Potassium 4.0 3.5 - 5.2 mmol/L   Chloride 100 96 - 106 mmol/L   CO2 25 18 -  29 mmol/L   Calcium 9.4 8.6 - 10.2 mg/dL   Total Protein 6.7 6.0 - 8.5 g/dL   Albumin 3.9 3.5 - 4.8 g/dL   Globulin, Total 2.8 1.5 - 4.5 g/dL   Albumin/Globulin Ratio 1.4 1.2 - 2.2   Bilirubin Total 0.4 0.0 - 1.2 mg/dL   Alkaline Phosphatase 52 39 - 117 IU/L   AST 57 (H) 0 - 40 IU/L   ALT 8 0 - 44 IU/L    No results found.  ASSESSMENT AND PLAN:  Otto was seen today for cough, sinusitis and sore throat.  Diagnoses and all orders for this visit:  Cough: Given rales and duration of illness I am placing him on azithromycin.  Initially advised doxy given side effect profile however patient could not afford this and requested azithromycin.  I have ordered.  -     CBC -     CMP14+EGFR  Abnormal lung sounds -     DG Chest 2 View; Future  Other orders -     Discontinue: doxycycline (VIBRAMYCIN) 100 MG capsule; Take 1 capsule (100 mg total) by mouth 2 (two) times daily.    The patient is advised to  call or return to clinic if he does not see an improvement in symptoms, or to seek the care of the closest emergency department if he worsens with the above plan.   Philis Fendt, MHS, PA-C [facility] 05/29/2016 3:37 PM

## 2016-05-27 NOTE — Patient Instructions (Signed)
     IF you received an x-ray today, you will receive an invoice from [facility]. Please contact  Radiology at [phone] with questions or concerns regarding your invoice.   IF you received labwork today, you will receive an invoice from [facility]. Please contact [facility] at [phone] with questions or concerns regarding your invoice.   Our billing staff will not be able to assist you with questions regarding bills from these companies.  You will be contacted with the lab results as soon as they are available. The fastest way to get your results is to activate your My Chart account. Instructions are located on the last page of this paperwork. If you have not heard from us regarding the results in 2 weeks, please contact this office.     

## 2016-05-28 ENCOUNTER — Telehealth: Payer: Self-pay

## 2016-05-28 LAB — CMP14+EGFR
A/G RATIO: 1.4 (ref 1.2–2.2)
ALBUMIN: 3.9 g/dL (ref 3.5–4.8)
ALT: 8 IU/L (ref 0–44)
AST: 57 IU/L — ABNORMAL HIGH (ref 0–40)
Alkaline Phosphatase: 52 IU/L (ref 39–117)
BUN / CREAT RATIO: 23 (ref 10–24)
BUN: 25 mg/dL (ref 8–27)
Bilirubin Total: 0.4 mg/dL (ref 0.0–1.2)
CALCIUM: 9.4 mg/dL (ref 8.6–10.2)
CO2: 25 mmol/L (ref 18–29)
CREATININE: 1.11 mg/dL (ref 0.76–1.27)
Chloride: 100 mmol/L (ref 96–106)
GFR calc Af Amer: 73 mL/min/{1.73_m2} (ref >59)
GFR, EST NON AFRICAN AMERICAN: 63 mL/min/{1.73_m2} (ref >59)
GLOBULIN, TOTAL: 2.8 g/dL (ref 1.5–4.5)
Glucose: 108 mg/dL — ABNORMAL HIGH (ref 65–99)
POTASSIUM: 4 mmol/L (ref 3.5–5.2)
SODIUM: 142 mmol/L (ref 134–144)
Total Protein: 6.7 g/dL (ref 6.0–8.5)

## 2016-05-28 LAB — CBC
HEMATOCRIT: 38.3 % (ref 37.5–51.0)
HEMOGLOBIN: 13.2 g/dL (ref 13.0–17.7)
MCH: 29.9 pg (ref 26.6–33.0)
MCHC: 34.5 g/dL (ref 31.5–35.7)
MCV: 87 fL (ref 79–97)
Platelets: 195 10*3/uL (ref 150–379)
RBC: 4.42 x10E6/uL (ref 4.14–5.80)
RDW: 13.7 % (ref 12.3–15.4)
WBC: 5.8 10*3/uL (ref 3.4–10.8)

## 2016-05-28 MED ORDER — AZITHROMYCIN 250 MG PO TABS: ORAL_TABLET | ORAL | 0 refills | Status: DC

## 2016-05-28 NOTE — Telephone Encounter (Signed)
I have called in azithromycin. Creatinine normal. Philis Fendt, MS, PA-C 1:47 PM, 05/28/2016

## 2016-05-28 NOTE — Telephone Encounter (Signed)
Doxy is 73.0  would like a cheaper rx sent to pharmacy. They suggest amox or zpak.

## 2016-06-27 DIAGNOSIS — L821 Other seborrheic keratosis: Secondary | ICD-10-CM | POA: Diagnosis not present

## 2016-06-27 DIAGNOSIS — D0339 Melanoma in situ of other parts of face: Secondary | ICD-10-CM | POA: Diagnosis not present

## 2016-06-27 DIAGNOSIS — Z85828 Personal history of other malignant neoplasm of skin: Secondary | ICD-10-CM | POA: Diagnosis not present

## 2016-06-27 DIAGNOSIS — D224 Melanocytic nevi of scalp and neck: Secondary | ICD-10-CM | POA: Diagnosis not present

## 2016-06-27 DIAGNOSIS — C44519 Basal cell carcinoma of skin of other part of trunk: Secondary | ICD-10-CM | POA: Diagnosis not present

## 2016-07-01 ENCOUNTER — Other Ambulatory Visit (HOSPITAL_COMMUNITY): Payer: Self-pay

## 2016-07-01 DIAGNOSIS — F418 Other specified anxiety disorders: Secondary | ICD-10-CM

## 2016-07-08 DIAGNOSIS — D0339 Melanoma in situ of other parts of face: Secondary | ICD-10-CM | POA: Diagnosis not present

## 2016-07-09 DIAGNOSIS — D0339 Melanoma in situ of other parts of face: Secondary | ICD-10-CM | POA: Diagnosis not present

## 2016-08-08 ENCOUNTER — Other Ambulatory Visit (HOSPITAL_COMMUNITY): Payer: Self-pay | Admitting: Psychiatry

## 2016-08-08 DIAGNOSIS — F418 Other specified anxiety disorders: Secondary | ICD-10-CM

## 2016-09-11 ENCOUNTER — Ambulatory Visit (INDEPENDENT_AMBULATORY_CARE_PROVIDER_SITE_OTHER): Payer: Medicare Other | Admitting: Psychiatry

## 2016-09-11 ENCOUNTER — Encounter (HOSPITAL_COMMUNITY): Payer: Self-pay | Admitting: Psychiatry

## 2016-09-11 VITALS — BP 120/74 | HR 82 | Ht 71.75 in | Wt 153.4 lb

## 2016-09-11 DIAGNOSIS — F418 Other specified anxiety disorders: Secondary | ICD-10-CM | POA: Diagnosis not present

## 2016-09-11 DIAGNOSIS — F321 Major depressive disorder, single episode, moderate: Secondary | ICD-10-CM

## 2016-09-11 DIAGNOSIS — Z79899 Other long term (current) drug therapy: Secondary | ICD-10-CM

## 2016-09-11 MED ORDER — MIRTAZAPINE 45 MG PO TABS: 45.0000 mg | ORAL_TABLET | Freq: Every day | ORAL | 3 refills | Status: DC

## 2016-09-11 MED ORDER — LORAZEPAM 0.5 MG PO TABS: ORAL_TABLET | ORAL | 5 refills | Status: DC
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
## 2016-11-07 ENCOUNTER — Other Ambulatory Visit (HOSPITAL_COMMUNITY): Payer: Self-pay | Admitting: Psychiatry

## 2016-11-07 DIAGNOSIS — F418 Other specified anxiety disorders: Secondary | ICD-10-CM

## 2016-11-12 ENCOUNTER — Other Ambulatory Visit (HOSPITAL_COMMUNITY): Payer: Self-pay

## 2016-11-12 DIAGNOSIS — F418 Other specified anxiety disorders: Secondary | ICD-10-CM

## 2016-11-12 MED ORDER — MIRTAZAPINE 45 MG PO TABS: 45.0000 mg | ORAL_TABLET | Freq: Every day | ORAL | 3 refills | Status: DC

## 2016-11-18 DIAGNOSIS — D224 Melanocytic nevi of scalp and neck: Secondary | ICD-10-CM | POA: Diagnosis not present

## 2016-11-18 DIAGNOSIS — L814 Other melanin hyperpigmentation: Secondary | ICD-10-CM | POA: Diagnosis not present

## 2016-11-18 DIAGNOSIS — C44622 Squamous cell carcinoma of skin of right upper limb, including shoulder: Secondary | ICD-10-CM | POA: Diagnosis not present

## 2016-11-18 DIAGNOSIS — Z85828 Personal history of other malignant neoplasm of skin: Secondary | ICD-10-CM | POA: Diagnosis not present

## 2016-11-18 DIAGNOSIS — C44519 Basal cell carcinoma of skin of other part of trunk: Secondary | ICD-10-CM | POA: Diagnosis not present

## 2016-11-18 DIAGNOSIS — L821 Other seborrheic keratosis: Secondary | ICD-10-CM | POA: Diagnosis not present

## 2016-12-13 ENCOUNTER — Encounter (INDEPENDENT_AMBULATORY_CARE_PROVIDER_SITE_OTHER): Payer: Self-pay

## 2016-12-13 ENCOUNTER — Ambulatory Visit (INDEPENDENT_AMBULATORY_CARE_PROVIDER_SITE_OTHER): Payer: Medicare Other | Admitting: Diagnostic Neuroimaging

## 2016-12-13 ENCOUNTER — Encounter: Payer: Self-pay | Admitting: Diagnostic Neuroimaging

## 2016-12-13 VITALS — BP 94/62 | HR 89 | Ht 72.0 in | Wt 156.7 lb

## 2016-12-13 DIAGNOSIS — F411 Generalized anxiety disorder: Secondary | ICD-10-CM | POA: Diagnosis not present

## 2016-12-13 DIAGNOSIS — G20 Parkinson's disease: Secondary | ICD-10-CM

## 2016-12-13 MED ORDER — CARBIDOPA-LEVODOPA 25-100 MG PO TABS: 2.0000 | ORAL_TABLET | Freq: Three times a day (TID) | ORAL | 4 refills | Status: DC

## 2016-12-13 MED ORDER — RASAGILINE MESYLATE 0.5 MG PO TABS: 0.5000 mg | ORAL_TABLET | Freq: Every day | ORAL | 12 refills | Status: DC

## 2016-12-13 NOTE — Progress Notes (Signed)
PATIENT: Lee Martinez DOB: March 15, 1936   REASON FOR VISIT: routine follow up for Parkinson's Disease HISTORY FROM: patient  Chief Complaint  Patient presents with  . Follow-up     HISTORY OF PRESENT ILLNESS:  UPDATE 12/13/16: Since last visit, sxs stable. Notes tremors are worse if he misses carb/levo. No balance issues. Anxiety is stable / slightly worse. On sertraline and mirtazapine per Dr. Casimiro Needle.  UPDATE 12/14/15: Since last visit, symptoms are stable. No change in balance, gait, swallow, depression or anxiety. Tolerating meds.   UPDATE 12/12/14: Since last visit, symptoms are stable. No falls. No wearing off. Minimal benefit with carb/levo.   UPDATE 06/13/14: Since last visit, doing about the same. No wearing off. Anixety is still his main problem, but feels better than before.   UPDATE 02/10/14: Since last visit, tremor and anxiety are persistent. Some wearing off before doses. Incomplete tremor control with each dose. No peak dyskinesias.   UPDATE 04/26/13 (LL): Since last visit, patient met with psychiatrist and his Remeron was increased.  He reports that his mood and anxiety is better.  He is sleeping better with aid from [place].  Denies problems with Sinemet wearing off, constipation, urinary frequency, or balance problems.   UPDATE 01/19/13: Since last visit, patient did not followup. He's not sure what happened. Patient's main complaint today is anxiety and sleep problems. He has been taking mirtazapine for several months without relief. In the past he did not express interest in SSRI or other medication. Now he is more amenable to these medications. He's never seen psychiatry.  Regarding tremor, stiffness and slowness of muscles, he feels the symptoms are stable. Carbidopa/levodopa seems to help a little bit. His wife has not noticed much difference. Patient did not go up on carbidopa/levodopa dosing last time.   UPDATE 12/04/11: Doing about the same. Not much benefit on  carb/levo, although has only been on 1 tab TID x 1.5 weeks. No side effects. Asking about acupuncture. Having some anxiety and sleep problems. Not interested in SSRI or other meds now.   PRIOR HPI: 81 year old right-handed male with history of gastric ulcer, here for evaluation of tremor. Patient reports tremor in the left upper extremity starting 3 years ago. Now tremor affecting his right upper extremity and legs. Denies voice or chin tremor. Tremor mainly affects him when he has stressed or tired. He describes a primarily resting tremor which improves with action. Patient denies dizziness but has mild uneasiness with walking. No change in sense of smell or taste. No reports of acting out dreams or REM behavior disorder symptoms. He has developed mild anxiety over the past one year. No constipation. Several weeks ago patient saw a homeopathic doctor who evaluated this tremor problem and diagnosed him with a "ganglion problem" likely due to "parasites". He was not prescribed any medications.    REVIEW OF SYSTEMS: Full 14 system review of systems performed and negative except: tremors.    ALLERGIES: No Known Allergies  HOME MEDICATIONS: Outpatient Medications Prior to Visit  Medication Sig Dispense Refill  . carbidopa-levodopa (SINEMET IR) 25-100 MG tablet Take 2 tablets by mouth 3 (three) times daily. 540 tablet 4  . cholecalciferol (VITAMIN D) 1000 UNITS tablet Take 1,000 Units by mouth daily.    . fish oil-omega-3 fatty acids 1000 MG capsule Take 2 g by mouth daily.    Marland Kitchen LORazepam (ATIVAN) 0.5 MG tablet 1 BID 180 tablet 5  . mirtazapine (REMERON) 45 MG tablet Take 1  tablet (45 mg total) by mouth at bedtime. 90 tablet 3  . Multiple Vitamin (MULTIVITAMIN) tablet Take 1 tablet by mouth daily.    . saw palmetto 160 MG capsule Take 160 mg by mouth 2 (two) times daily.    Marland Kitchen UNABLE TO FIND Med Name: [name], natural herb     No facility-administered medications prior to visit.     PAST MEDICAL  HISTORY: Past Medical History:  Diagnosis Date  . Parkinson's disease ([place]) 07/2011   followed by [facility]rology    PAST SURGICAL HISTORY: Past Surgical History:  Procedure Laterality Date  . basil removal    . HERNIA REPAIR    . THYROID SURGERY      FAMILY HISTORY: Family History  Problem Relation Age of Onset  . Heart failure Mother   . Breast cancer Sister     SOCIAL HISTORY: Social History   Social History  . Marital status: Married    Spouse name: serena  . Number of children: 0  . Years of education: 12   Occupational History  . retired Retired   Social History Main Topics  . Smoking status: Never Smoker  . Smokeless tobacco: Never Used  . Alcohol use 0.6 oz/week    1 Shots of liquor per week  . Drug use: No  . Sexual activity: Not Currently    Partners: Female   Other Topics Concern  . Not on file   Social History Narrative  . No narrative on file     PHYSICAL EXAM  Vitals:   12/13/16 1153  BP: 94/62  Pulse: 89  Weight: 156 lb 11.2 oz (71.1 kg)  Height: 6' (1.829 m)   Body mass index is 21.25 kg/m.  Generalized: Patient is awake, alert and in no acute distress. Well developed and groomed.  Neck: Supple, no carotid bruits  Cardiac: Regular rate rhythm, no murmur   Neurological examination  Mental Status: Awake, alert. Language is fluent and comprehension intact. POSITIVE MYERSONS. MASKED FACIES.  Cranial Nerves: Pupils are equal and reactive to light. Visual fields are full to confrontation. Conjugate eye movements are full and symmetric. Facial sensation and strength are symmetric. Hearing is intact. Palate elevated symmetrically and uvula is midline. Shoulder shrug is symmetric. Tongue is midline.  Motor: RESTING TREMOR (BUE > BLE). Normal bulk and MILD COGWHEELING IN LUE > RUE. BRADYKINESIA IN LUE AND BLE. MOUTH TREMOR WITH RAM. Full strength in the upper and lower extremities. No pronator drift.  Sensory: Intact and symmetric to  light touch, temperature, vibration.  Coordination: No ataxia or dysmetria on finger-nose or rapid alternating movement testing.  Reflexes: Deep tendon reflexes in the upper and lower extremity are present and symmetric. Gait and Station: STOOPED POSTURE. LUE TREMOR WITH WALKING. POOR ARM SWING. LEFT ARM TREMOR WITH WALKING. SHUFFLING GAIT WITH LEFT FOOT.   DIAGNOSTIC DATA (LABS, IMAGING, TESTING)   11/04/11 MRI brain  1. Mild periventricular gliosis. Few punctate non-specific subcortical foci of gliosis. May represent chronic small vessel ischemic disease.  2. No acute findings.    ASSESSMENT AND PLAN  81 y.o. male with progressive resting tremor left greater than right upper extremities, bilateral lower extremities, with cognitive rigidity, bradykinesia, stooped posture and Myerson sign. Mild response to carb/levo. Depression/anxiety stable.    Dx: idiopathic Parkinson's disease.   Parkinson's disease ([place])  Anxiety state   PLAN:   I spent 25 minutes of face to face time with patient. Greater than 50% of time was spent in counseling  and coordination of care with patient. In summary we discussed:   PARKINSONS DISEASE - continue carbidopa-levodopa 2 tabs three times per day - add on rasagiline 0.99m daily - caution with balance, walking and driving - reviewed DBS options; patient not interested at this time  DEPRESSION / ANXIETY - continue sertraline and mirtazipine for depression/anxiety (per Dr. PCasimiro Needle  Meds ordered this encounter  Medications  . carbidopa-levodopa (SINEMET IR) 25-100 MG tablet    Sig: Take 2 tablets by mouth 3 (three) times daily.    Dispense:  540 tablet    Refill:  4  . rasagiline (AZILECT) 0.5 MG TABS tablet    Sig: Take 1 tablet (0.5 mg total) by mouth daily.    Dispense:  30 tablet    Refill:  12   Return in about 6 months (around 06/15/2017).     VPenni Bombard MD 87/01/4996 118:20PM Certified in Neurology, Neurophysiology and  Neuroimaging  [facility] [address] [place]Prince [place] [phone]

## 2017-01-10 DIAGNOSIS — R972 Elevated prostate specific antigen [PSA]: Secondary | ICD-10-CM | POA: Diagnosis not present

## 2017-01-10 DIAGNOSIS — R5383 Other fatigue: Secondary | ICD-10-CM | POA: Diagnosis not present

## 2017-01-10 DIAGNOSIS — E784 Other hyperlipidemia: Secondary | ICD-10-CM | POA: Diagnosis not present

## 2017-01-10 DIAGNOSIS — Z0000 Encounter for general adult medical examination without abnormal findings: Secondary | ICD-10-CM | POA: Diagnosis not present

## 2017-01-10 DIAGNOSIS — G20 Parkinson's disease: Secondary | ICD-10-CM | POA: Diagnosis not present

## 2017-01-10 DIAGNOSIS — Z79899 Other long term (current) drug therapy: Secondary | ICD-10-CM | POA: Diagnosis not present

## 2017-03-12 DIAGNOSIS — H04123 Dry eye syndrome of bilateral lacrimal glands: Secondary | ICD-10-CM | POA: Diagnosis not present

## 2017-03-12 DIAGNOSIS — H2513 Age-related nuclear cataract, bilateral: Secondary | ICD-10-CM | POA: Diagnosis not present

## 2017-03-12 DIAGNOSIS — H31092 Other chorioretinal scars, left eye: Secondary | ICD-10-CM | POA: Diagnosis not present

## 2017-03-12 DIAGNOSIS — D485 Neoplasm of uncertain behavior of skin: Secondary | ICD-10-CM | POA: Diagnosis not present

## 2017-03-17 DIAGNOSIS — D22121 Melanocytic nevi of left upper eyelid, including canthus: Secondary | ICD-10-CM | POA: Diagnosis not present

## 2017-06-16 ENCOUNTER — Encounter: Payer: Self-pay | Admitting: Diagnostic Neuroimaging

## 2017-06-16 ENCOUNTER — Encounter (INDEPENDENT_AMBULATORY_CARE_PROVIDER_SITE_OTHER): Payer: Self-pay

## 2017-06-16 ENCOUNTER — Ambulatory Visit: Payer: Medicare Other | Admitting: Diagnostic Neuroimaging

## 2017-06-16 VITALS — BP 97/61 | HR 88 | Wt 157.4 lb

## 2017-06-16 DIAGNOSIS — G20 Parkinson's disease: Secondary | ICD-10-CM

## 2017-06-16 DIAGNOSIS — F411 Generalized anxiety disorder: Secondary | ICD-10-CM | POA: Diagnosis not present

## 2017-06-16 MED ORDER — CARBIDOPA-LEVODOPA 25-100 MG PO TABS: 2.0000 | ORAL_TABLET | Freq: Four times a day (QID) | ORAL | 4 refills | Status: DC

## 2017-06-16 MED ORDER — RASAGILINE MESYLATE 0.5 MG PO TABS: 0.5000 mg | ORAL_TABLET | Freq: Every day | ORAL | 4 refills | Status: DC

## 2017-06-16 NOTE — Progress Notes (Signed)
PATIENT: Lee Martinez DOB: 10/20/1935   REASON FOR VISIT: routine follow up for Parkinson's Disease HISTORY FROM: patient  Chief Complaint  Patient presents with  . Parkinson's disease    rm 7, "no new concerns"  . Follow-up    6 month     HISTORY OF PRESENT ILLNESS:  UPDATE (06/16/17, VRP): Since last visit, doing well. Tolerating carb/levo and rasagiline. No alleviating or aggravating factors. No issues with balance, swallowing, smell or taste.   UPDATE 12/13/16: Since last visit, sxs stable. Notes tremors are worse if he misses carb/levo. No balance issues. Anxiety is stable / slightly worse. On sertraline and mirtazapine per Dr. Casimiro Needle.  UPDATE 12/14/15: Since last visit, symptoms are stable. No change in balance, gait, swallow, depression or anxiety. Tolerating meds.   UPDATE 12/12/14: Since last visit, symptoms are stable. No falls. No wearing off. Minimal benefit with carb/levo.   UPDATE 06/13/14: Since last visit, doing about the same. No wearing off. Anixety is still his main problem, but feels better than before.   UPDATE 02/10/14: Since last visit, tremor and anxiety are persistent. Some wearing off before doses. Incomplete tremor control with each dose. No peak dyskinesias.   UPDATE 04/26/13 (LL): Since last visit, patient met with psychiatrist and his Remeron was increased.  He reports that his mood and anxiety is better.  He is sleeping better with aid from [place].  Denies problems with Sinemet wearing off, constipation, urinary frequency, or balance problems.   UPDATE 01/19/13: Since last visit, patient did not followup. He's not sure what happened. Patient's main complaint today is anxiety and sleep problems. He has been taking mirtazapine for several months without relief. In the past he did not express interest in SSRI or other medication. Now he is more amenable to these medications. He's never seen psychiatry.  Regarding tremor, stiffness and slowness of  muscles, he feels the symptoms are stable. Carbidopa/levodopa seems to help a little bit. His wife has not noticed much difference. Patient did not go up on carbidopa/levodopa dosing last time.   UPDATE 12/04/11: Doing about the same. Not much benefit on carb/levo, although has only been on 1 tab TID x 1.5 weeks. No side effects. Asking about acupuncture. Having some anxiety and sleep problems. Not interested in SSRI or other meds now.   PRIOR HPI: 82 year old right-handed male with history of gastric ulcer, here for evaluation of tremor. Patient reports tremor in the left upper extremity starting 3 years ago. Now tremor affecting his right upper extremity and legs. Denies voice or chin tremor. Tremor mainly affects him when he has stressed or tired. He describes a primarily resting tremor which improves with action. Patient denies dizziness but has mild uneasiness with walking. No change in sense of smell or taste. No reports of acting out dreams or REM behavior disorder symptoms. He has developed mild anxiety over the past one year. No constipation. Several weeks ago patient saw a homeopathic doctor who evaluated this tremor problem and diagnosed him with a "ganglion problem" likely due to "parasites". He was not prescribed any medications.    REVIEW OF SYSTEMS: Full 14 system review of systems performed and negative except: tremors.    ALLERGIES: No Known Allergies  HOME MEDICATIONS: Outpatient Medications Prior to Visit  Medication Sig Dispense Refill  . carbidopa-levodopa (SINEMET IR) 25-100 MG tablet Take 2 tablets by mouth 3 (three) times daily. 540 tablet 4  . cholecalciferol (VITAMIN D) 1000 UNITS tablet Take 1,000  Units by mouth daily.    . fish oil-omega-3 fatty acids 1000 MG capsule Take 2 g by mouth daily.    Marland Kitchen LORazepam (ATIVAN) 0.5 MG tablet 1 BID 180 tablet 5  . mirtazapine (REMERON) 45 MG tablet Take 1 tablet (45 mg total) by mouth at bedtime. 90 tablet 3  . Multiple Vitamin  (MULTIVITAMIN) tablet Take 1 tablet by mouth daily.    . rasagiline (AZILECT) 0.5 MG TABS tablet Take 1 tablet (0.5 mg total) by mouth daily. 30 tablet 12  . saw palmetto 160 MG capsule Take 160 mg by mouth 2 (two) times daily.    . sertraline (ZOLOFT) 100 MG tablet     . UNABLE TO FIND Med Name: [name], natural herb     No facility-administered medications prior to visit.     PAST MEDICAL HISTORY: Past Medical History:  Diagnosis Date  . Parkinson's disease ([place]) 07/2011   followed by [facility]rology    PAST SURGICAL HISTORY: Past Surgical History:  Procedure Laterality Date  . basil removal    . HERNIA REPAIR    . THYROID SURGERY      FAMILY HISTORY: Family History  Problem Relation Age of Onset  . Heart failure Mother   . Breast cancer Sister     SOCIAL HISTORY: Social History   Socioeconomic History  . Marital status: Married    Spouse name: serena  . Number of children: 0  . Years of education: 65  . Highest education level: Not on file  Social Needs  . Financial resource strain: Not on file  . Food insecurity - worry: Not on file  . Food insecurity - inability: Not on file  . Transportation needs - medical: Not on file  . Transportation needs - non-medical: Not on file  Occupational History  . Occupation: retired    Fish farm manager: RETIRED  Tobacco Use  . Smoking status: Never Smoker  . Smokeless tobacco: Never Used  Substance and Sexual Activity  . Alcohol use: Yes    Alcohol/week: 0.6 oz    Types: 1 Shots of liquor per week  . Drug use: No  . Sexual activity: Not Currently    Partners: Female  Other Topics Concern  . Not on file  Social History Narrative  . Not on file     PHYSICAL EXAM  Vitals:   06/16/17 1348  BP: 97/61  Pulse: 88  Weight: 157 lb 6.4 oz (71.4 kg)   Body mass index is 21.35 kg/m.  Generalized: Patient is awake, alert and in no acute distress. Well developed and groomed.  Neck: Supple, no carotid bruits  Cardiac:  Regular rate rhythm, no murmur   Neurological examination  Mental Status: Awake, alert. Language is fluent and comprehension intact. POSITIVE MYERSONS. MASKED FACIES.  Cranial Nerves: Pupils are equal and reactive to light. Visual fields are full to confrontation. Conjugate eye movements are full and symmetric. Facial sensation and strength are symmetric. Hearing is intact. Palate elevated symmetrically and uvula is midline. Shoulder shrug is symmetric. Tongue is midline.  Motor: RESTING TREMOR (BUE > BLE). Normal bulk and MILD COGWHEELING IN LUE > RUE. BRADYKINESIA IN LUE AND BLE. MOUTH TREMOR WITH RAM. Full strength in the upper and lower extremities. No pronator drift.  Sensory: Intact and symmetric to light touch, temperature, vibration.  Coordination: No ataxia or dysmetria on finger-nose or rapid alternating movement testing.  Reflexes: Deep tendon reflexes in the upper and lower extremity are present and symmetric. Gait and Station:  STOOPED POSTURE. LUE TREMOR WITH WALKING. DECR LEFT ARM SWING. LEFT ARM TREMOR WITH WALKING. SMALL STEPS WITH LEFT FOOT. POSITIVE PULL BACK TEST   DIAGNOSTIC DATA (LABS, IMAGING, TESTING)   11/04/11 MRI brain  1. Mild periventricular gliosis. Few punctate non-specific subcortical foci of gliosis. May represent chronic small vessel ischemic disease.  2. No acute findings.    ASSESSMENT AND PLAN  82 y.o. male with progressive resting tremor left greater than right upper extremities, bilateral lower extremities, with cognitive rigidity, bradykinesia, stooped posture and Myerson sign. Mild response to carb/levo. Depression/anxiety stable.    Dx: idiopathic Parkinson's disease.   Parkinson's disease ([place])  Anxiety state   PLAN:    PARKINSONS DISEASE - continue carbidopa-levodopa 2 tabs three times per day; may add 1-2 tabs in evening - continue rasagiline 0.58m daily - caution with balance, walking and driving; offered PT but patient declined -  reviewed DBS options in past; patient not interested at this time  DEPRESSION / ANXIETY - continue sertraline and mirtazipine for depression/anxiety (per PCP and Dr. PCasimiro Needle  Meds ordered this encounter  Medications  . carbidopa-levodopa (SINEMET IR) 25-100 MG tablet    Sig: Take 2 tablets by mouth 4 (four) times daily.    Dispense:  720 tablet    Refill:  4  . rasagiline (AZILECT) 0.5 MG TABS tablet    Sig: Take 1 tablet (0.5 mg total) by mouth daily.    Dispense:  90 tablet    Refill:  4   Return in about 8 months (around 02/13/2018).     VPenni Bombard MD 20/11/3541 20:14PM Certified in Neurology, Neurophysiology and Neuroimaging  [facility] [address] [place] [phone]

## 2017-07-07 ENCOUNTER — Telehealth: Payer: Self-pay | Admitting: Diagnostic Neuroimaging

## 2017-07-07 NOTE — Telephone Encounter (Signed)
Pt is asking for a call back re: rasagiline (AZILECT) 0.5 MG TABS tablet  Pt states he has questions about it

## 2017-07-07 NOTE — Telephone Encounter (Addendum)
Called patient who stated that he has never had to pay anything at all for Azilect. He stated in the past he had gotten Azilect, not rasagiline. He stated [facility] months and charged him > $300. He called [facility], canceled the Rx, was reimbursed and received instructions on how to dispose of medication. He stated he has not taken Zoloft "in a long time". He asked if Rasagiline was replacing lorazepam or Zoloft and why was he taking it. This RN advised it is for his Parkinson's disease. Patient stated he cannot afford to take it. He is asking if there is an alternative or if he needs to take it. He also stated he had called someone else about the cost of the medicine, but he couldn't remember who he spoke with. This RN advised he not destroy or dispense of rasagiline until discussed with Dr Leta Baptist. Patient verbalized understanding.

## 2017-07-07 NOTE — Telephone Encounter (Signed)
Spoke with patient to advise him that Dr Leta Baptist recommends he continue taking Azilect for his Parkinson's disease. The patient stated he has never taken that medication. This RN attempted to explain that it is the same as rasagiline which patient had spelled from his bottle on earlier phone call. Patient denied ever taking either one. He then stated all he takes is sertraline and lorazepam. This RN reminded him that he had said he hasn't taken zoloft (sertraline) in a long time. The patient continued to be confused about generic vs brand name medications.  He stated that he doesn't want to take rasagiline. This RN advised him she will call insurance tomorrow to find out why his cost is so high. He will get a call back tomorrow. He verbalized understanding, appreciation.

## 2017-07-07 NOTE — Telephone Encounter (Signed)
I recommend patient should continue on this medication if possible to afford. Please find out about reimbursement issues. -VRP

## 2017-07-08 NOTE — Telephone Encounter (Addendum)
Called [facility], spoke with Baker Janus,  technician requesting information about high cost of rasagiline. Baker Janus stated the medication is a Tier 4 on plan which is the most expensive tier. This is the first time patient has filled it thru [facility].  This RN will call local pharmacy and get cost. [facility], tech stated patient has never received medication from [place] in past.

## 2017-07-08 NOTE — Telephone Encounter (Signed)
Received fax from [facility], rasagiline is covered under patient's plan. The cost remains high as it is a tier 4 drug per the pharmacist. Called patient to advise him this RN was unable to get cost lowered even with insurance coverage. Advised him Dr Leta Baptist preferred he stay on medication if he can afford it. Otherwise he may stop medication. Advised he monitor his tremors and other symptoms and call for any problems, questions or concerns. He verbalized understanding, appreciation.

## 2017-07-08 NOTE — Telephone Encounter (Signed)
Faxed to [facility] request form with last office note, re: PA for rasagiline.

## 2017-07-18 DIAGNOSIS — G20 Parkinson's disease: Secondary | ICD-10-CM | POA: Diagnosis not present

## 2017-07-18 DIAGNOSIS — E785 Hyperlipidemia, unspecified: Secondary | ICD-10-CM | POA: Diagnosis not present

## 2017-09-11 ENCOUNTER — Other Ambulatory Visit (HOSPITAL_COMMUNITY): Payer: Self-pay | Admitting: Psychiatry

## 2017-09-11 DIAGNOSIS — F418 Other specified anxiety disorders: Secondary | ICD-10-CM

## 2017-12-09 ENCOUNTER — Other Ambulatory Visit (HOSPITAL_COMMUNITY): Payer: Self-pay | Admitting: Psychiatry

## 2017-12-09 DIAGNOSIS — F418 Other specified anxiety disorders: Secondary | ICD-10-CM

## 2018-01-14 DIAGNOSIS — G20 Parkinson's disease: Secondary | ICD-10-CM

## 2018-01-14 DIAGNOSIS — Z0000 Encounter for general adult medical examination without abnormal findings: Secondary | ICD-10-CM | POA: Diagnosis not present

## 2018-01-14 DIAGNOSIS — R972 Elevated prostate specific antigen [PSA]: Secondary | ICD-10-CM

## 2018-01-14 DIAGNOSIS — E785 Hyperlipidemia, unspecified: Secondary | ICD-10-CM

## 2018-01-14 DIAGNOSIS — F329 Major depressive disorder, single episode, unspecified: Secondary | ICD-10-CM

## 2018-02-16 ENCOUNTER — Ambulatory Visit: Payer: Self-pay | Admitting: Diagnostic Neuroimaging

## 2018-03-18 DIAGNOSIS — H2513 Age-related nuclear cataract, bilateral: Secondary | ICD-10-CM | POA: Diagnosis not present

## 2018-03-18 DIAGNOSIS — H31092 Other chorioretinal scars, left eye: Secondary | ICD-10-CM | POA: Diagnosis not present

## 2018-03-18 DIAGNOSIS — H04123 Dry eye syndrome of bilateral lacrimal glands: Secondary | ICD-10-CM | POA: Diagnosis not present

## 2018-04-17 ENCOUNTER — Other Ambulatory Visit: Payer: Self-pay | Admitting: Nurse Practitioner

## 2018-04-17 DIAGNOSIS — F418 Other specified anxiety disorders: Secondary | ICD-10-CM

## 2018-04-17 MED ORDER — LORAZEPAM 0.5 MG PO TABS: ORAL_TABLET | ORAL | 3 refills | Status: DC

## 2018-04-20 ENCOUNTER — Ambulatory Visit: Payer: Medicare Other | Admitting: Diagnostic Neuroimaging

## 2018-04-20 ENCOUNTER — Encounter: Payer: Self-pay | Admitting: Diagnostic Neuroimaging

## 2018-04-20 VITALS — BP 140/88 | HR 81 | Ht 72.0 in | Wt 143.0 lb

## 2018-04-20 DIAGNOSIS — F0630 Mood disorder due to known physiological condition, unspecified: Secondary | ICD-10-CM | POA: Diagnosis not present

## 2018-04-20 DIAGNOSIS — G20 Parkinson's disease: Secondary | ICD-10-CM

## 2018-04-20 MED ORDER — CARBIDOPA-LEVODOPA 25-100 MG PO TABS: 2.0000 | ORAL_TABLET | Freq: Four times a day (QID) | ORAL | 4 refills | Status: DC

## 2018-04-20 NOTE — Progress Notes (Signed)
PATIENT: Lee Martinez DOB: Oct 09, 1935   REASON FOR VISIT: routine follow up for Parkinson's Disease HISTORY FROM: patient  Chief Complaint  Patient presents with  . Follow-up    8 month follow up. Wife present. Rm 7. Patient stated that he has some concerns about Rasagiline he stated that he insurance doesn't cover it. He also wants to discuss having a MRI done.      HISTORY OF PRESENT ILLNESS:  UPDATE (04/20/18, VRP): Since last visit, doing about the same. Symptoms are stable. Severity is moderate. No alleviating or aggravating factors. Tolerating carb/levo. Did not take rasagiline due to cost.    UPDATE (06/16/17, VRP): Since last visit, doing well. Tolerating carb/levo and rasagiline. No alleviating or aggravating factors. No issues with balance, swallowing, smell or taste.   UPDATE 12/13/16: Since last visit, sxs stable. Notes tremors are worse if he misses carb/levo. No balance issues. Anxiety is stable / slightly worse. On sertraline and mirtazapine per Dr. Casimiro Needle.  UPDATE 12/14/15: Since last visit, symptoms are stable. No change in balance, gait, swallow, depression or anxiety. Tolerating meds.   UPDATE 12/12/14: Since last visit, symptoms are stable. No falls. No wearing off. Minimal benefit with carb/levo.   UPDATE 06/13/14: Since last visit, doing about the same. No wearing off. Anixety is still his main problem, but feels better than before.   UPDATE 02/10/14: Since last visit, tremor and anxiety are persistent. Some wearing off before doses. Incomplete tremor control with each dose. No peak dyskinesias.   UPDATE 04/26/13 (LL): Since last visit, patient met with psychiatrist and his Remeron was increased.  He reports that his mood and anxiety is better.  He is sleeping better with aid from [place].  Denies problems with Sinemet wearing off, constipation, urinary frequency, or balance problems.   UPDATE 01/19/13: Since last visit, patient did not followup. He's not sure  what happened. Patient's main complaint today is anxiety and sleep problems. He has been taking mirtazapine for several months without relief. In the past he did not express interest in SSRI or other medication. Now he is more amenable to these medications. He's never seen psychiatry.  Regarding tremor, stiffness and slowness of muscles, he feels the symptoms are stable. Carbidopa/levodopa seems to help a little bit. His wife has not noticed much difference. Patient did not go up on carbidopa/levodopa dosing last time.   UPDATE 12/04/11: Doing about the same. Not much benefit on carb/levo, although has only been on 1 tab TID x 1.5 weeks. No side effects. Asking about acupuncture. Having some anxiety and sleep problems. Not interested in SSRI or other meds now.   PRIOR HPI: 82 year old right-handed male with history of gastric ulcer, here for evaluation of tremor. Patient reports tremor in the left upper extremity starting 3 years ago. Now tremor affecting his right upper extremity and legs. Denies voice or chin tremor. Tremor mainly affects him when he has stressed or tired. He describes a primarily resting tremor which improves with action. Patient denies dizziness but has mild uneasiness with walking. No change in sense of smell or taste. No reports of acting out dreams or REM behavior disorder symptoms. He has developed mild anxiety over the past one year. No constipation. Several weeks ago patient saw a homeopathic doctor who evaluated this tremor problem and diagnosed him with a "ganglion problem" likely due to "parasites". He was not prescribed any medications.    REVIEW OF SYSTEMS: Full 14 system review of systems performed  and negative except: memory loss anxiety.    ALLERGIES: No Known Allergies  HOME MEDICATIONS: Outpatient Medications Prior to Visit  Medication Sig Dispense Refill  . carbidopa-levodopa (SINEMET IR) 25-100 MG tablet Take 2 tablets by mouth 4 (four) times daily. 720 tablet 4    . cholecalciferol (VITAMIN D) 1000 UNITS tablet Take 1,000 Units by mouth daily.    . fish oil-omega-3 fatty acids 1000 MG capsule Take 2 g by mouth daily.    Marland Kitchen LORazepam (ATIVAN) 0.5 MG tablet 1 BID 60 tablet 3  . mirtazapine (REMERON) 45 MG tablet Take 1 tablet (45 mg total) by mouth at bedtime. 90 tablet 3  . Multiple Vitamin (MULTIVITAMIN) tablet Take 1 tablet by mouth daily.    . saw palmetto 160 MG capsule Take 160 mg by mouth 2 (two) times daily.    . sertraline (ZOLOFT) 100 MG tablet     . UNABLE TO FIND Med Name: [name], natural herb    . rasagiline (AZILECT) 0.5 MG TABS tablet Take 1 tablet (0.5 mg total) by mouth daily. (Patient not taking: Reported on 04/20/2018) 90 tablet 4   No facility-administered medications prior to visit.     PAST MEDICAL HISTORY: Past Medical History:  Diagnosis Date  . Parkinson's disease ([place]) 07/2011   followed by [facility]    PAST SURGICAL HISTORY: Past Surgical History:  Procedure Laterality Date  . basil removal    . HERNIA REPAIR    . THYROID SURGERY      FAMILY HISTORY: Family History  Problem Relation Age of Onset  . Heart failure Mother   . Breast cancer Sister     SOCIAL HISTORY: Social History   Socioeconomic History  . Marital status: Married    Spouse name: serena  . Number of children: 0  . Years of education: 22  . Highest education level: Not on file  Occupational History  . Occupation: retired    Fish farm manager: RETIRED  Social Needs  . Financial resource strain: Not on file  . Food insecurity:    Worry: Not on file    Inability: Not on file  . Transportation needs:    Medical: Not on file    Non-medical: Not on file  Tobacco Use  . Smoking status: Never Smoker  . Smokeless tobacco: Never Used  Substance and Sexual Activity  . Alcohol use: Yes    Alcohol/week: 1.0 standard drinks    Types: 1 Shots of liquor per week  . Drug use: No  . Sexual activity: Not Currently    Partners: Female  Lifestyle   . Physical activity:    Days per week: Not on file    Minutes per session: Not on file  . Stress: Not on file  Relationships  . Social connections:    Talks on phone: Not on file    Gets together: Not on file    Attends religious service: Not on file    Active member of club or organization: Not on file    Attends meetings of clubs or organizations: Not on file    Relationship status: Not on file  . Intimate partner violence:    Fear of current or ex partner: Not on file    Emotionally abused: Not on file    Physically abused: Not on file    Forced sexual activity: Not on file  Other Topics Concern  . Not on file  Social History Narrative  . Not on file  PHYSICAL EXAM  Vitals:   04/20/18 1336  BP: 140/88  Pulse: 81  Weight: 143 lb (64.9 kg)  Height: 6' (1.829 m)   Body mass index is 19.39 kg/m.  Generalized: Patient is awake, alert and in no acute distress. Well developed and groomed.  Neck: Supple, no carotid bruits  Cardiac: Regular rate rhythm, no murmur   Neurological examination  Mental Status: Awake, alert. Language is fluent and comprehension intact. POSITIVE MYERSONS. MASKED FACIES.  Cranial Nerves: Pupils are equal and reactive to light. Visual fields are full to confrontation. Conjugate eye movements are full and symmetric. Facial sensation and strength are symmetric. Hearing is intact. Palate elevated symmetrically and uvula is midline. Shoulder shrug is symmetric. Tongue is midline.  Motor: RESTING TREMOR (BUE > BLE; LEFT WORSE THAN RIGHT). Normal bulk and MILD COGWHEELING IN LUE > RUE. BRADYKINESIA IN LUE AND BLE. MOUTH TREMOR WITH RAM. Full strength in the upper and lower extremities. No pronator drift.  Sensory: Intact and symmetric to light touch, temperature, vibration.  Coordination: No ataxia or dysmetria on finger-nose or rapid alternating movement testing.  Reflexes: Deep tendon reflexes in the upper and lower extremity are present and  symmetric. Gait and Station: LUE TREMOR WITH WALKING. DECR LEFT ARM SWING. LEFT ARM TREMOR WITH WALKING.    DIAGNOSTIC DATA (LABS, IMAGING, TESTING)   11/04/11 MRI brain  1. Mild periventricular gliosis. Few punctate non-specific subcortical foci of gliosis. May represent chronic small vessel ischemic disease.  2. No acute findings.    ASSESSMENT AND PLAN  82 y.o. male with progressive resting tremor left greater than right upper extremities, bilateral lower extremities, with cognitive rigidity, bradykinesia, stooped posture and Myerson sign. Mild response to carb/levo. Depression/anxiety stable.   Rx'd rasagiline, but too expensive.    Dx: idiopathic Parkinson's disease.   Parkinson's disease ([place])  Mood disorder in conditions classified elsewhere   PLAN:   PARKINSONS DISEASE - continue carbidopa-levodopa 2 tabs 4 times per day - caution with balance, walking and driving - refer to PT - reviewed DBS options in past; patient not interested at this time  DEPRESSION / ANXIETY - continue sertraline and mirtazipine for depression/anxiety (per PCP and Dr. Casimiro Needle)  Meds ordered this encounter  Medications  . carbidopa-levodopa (SINEMET IR) 25-100 MG tablet    Sig: Take 2 tablets by mouth 4 (four) times daily.    Dispense:  720 tablet    Refill:  4   Orders Placed This Encounter  Procedures  . Ambulatory referral to Physical Therapy   Return in about 1 year (around 04/21/2019).     Penni Bombard, MD 72/12/9789, 5:04 PM Certified in Neurology, Neurophysiology and Neuroimaging  [facility] [address] [place], [place] [address] [phone]

## 2018-05-21 ENCOUNTER — Encounter: Payer: Self-pay | Admitting: Physical Therapy

## 2018-05-21 ENCOUNTER — Other Ambulatory Visit: Payer: Self-pay

## 2018-05-21 ENCOUNTER — Ambulatory Visit: Payer: Medicare Other | Attending: Diagnostic Neuroimaging | Admitting: Physical Therapy

## 2018-05-21 DIAGNOSIS — R2689 Other abnormalities of gait and mobility: Secondary | ICD-10-CM | POA: Insufficient documentation

## 2018-05-21 DIAGNOSIS — R293 Abnormal posture: Secondary | ICD-10-CM | POA: Diagnosis not present

## 2018-05-21 DIAGNOSIS — R29818 Other symptoms and signs involving the nervous system: Secondary | ICD-10-CM | POA: Insufficient documentation

## 2018-05-22 NOTE — Therapy (Signed)
[facility] [address] [place], [place], [address] Phone: [phone]   Fax:  [phone]  Physical Therapy Evaluation  Patient Details  Name: Lee Martinez MRN: [id number] Date of Birth: July 23, 1935 Referring Provider (PT): Penni Bombard, MD   Encounter Date: 05/21/2018  PT End of Session - 05/22/18 1643    Visit Number  1    Number of Visits  9    Date for PT Re-Evaluation  06/20/18    Authorization Type  [employer] Medicare (no other information available, have asked for Beverly Gust. to look into ?$50 copay)    Authorization Time Period  05/21/18 to 08/19/2018    PT Start Time  1408    PT Stop Time  1448    PT Time Calculation (min)  40 min    Activity Tolerance  Patient tolerated treatment well    Behavior During Therapy  [facility] for tasks assessed/performed       Past Medical History:  Diagnosis Date  . Parkinson's disease ([place]) 07/2011   followed by [facility]    Past Surgical History:  Procedure Laterality Date  . basil removal    . HERNIA REPAIR    . THYROID SURGERY      There were no vitals filed for this visit.   Subjective Assessment - 05/21/18 1413    Subjective  I'm not sure why I'm here. I used to exercise at the [employer] but doesn't remember why he stopped going     Pertinent History  PMH-PD    Patient Stated Goals  unsure; by end of session has an interest in learning more about how exercise can effect his Parkinson's symptoms    Currently in Pain?  No/denies         [facility] PT Assessment - 05/21/18 1414      Assessment   Medical Diagnosis   Parkinson's Disease    Referring Provider (PT)  Leta Baptist, Earlean Polka, MD    Onset Date/Surgical Date  --   diagnosed 2013   Hand Dominance  Right    Prior Therapy  none      Precautions   Precautions  None      Balance Screen   Has the patient fallen in the past 6 months  No    Has the patient had a decrease in activity level because of a fear of  falling?   No    Is the patient reluctant to leave their home because of a fear of falling?   No      Home Environment   Living Environment  Private residence    Living Arrangements  Spouse/significant other    Available Help at Discharge  Family;Available 24 hours/day    Type of Home  Apartment   townhouse   Home Access  Stairs to enter    Entrance Stairs-Number of Steps  2    Entrance Stairs-Rails  Right    Home Layout  Two level;Bed/bath upstairs    Alternate Level Stairs-Number of Steps  16    Alternate Level Stairs-Rails  Right    Home Equipment  None    Additional Comments  helps wife with her stockings      Prior Function   Level of Independence  Independent    Vocation  Retired    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 [place]    Leisure  read,       Cognition   Overall Cognitive Status  Impaired/Different  from baseline    Attention  Selective    Sustained Attention  --    Selective Attention  Impaired    Selective Attention Impairment  Functional complex   distracted in gym environment   Memory  Impaired    Memory Impairment  Storage deficit;Retrieval deficit   faces,    Executive Function  --   pt pays the bills   Behaviors  Restless      Observation/Other Assessments   Observations  ?hearing deficit vs decr attention (or both)      Sensation   Light Touch  Appears Intact      Coordination   Gross Motor Movements are Fluid and Coordinated  Yes    Fine Motor Movements are Fluid and Coordinated  No   rapid toe taps slightly asynchronous     Posture/Postural Control   Posture/Postural Control  Postural limitations    Postural Limitations  Rounded Shoulders;Forward head      ROM / Strength   AROM / PROM / Strength  AROM;Strength      AROM   Overall AROM   Deficits    Overall AROM Comments  seated limited knee extension bil due to shortened hamstrings      Strength   Overall Strength  Within functional limits for tasks performed      Transfers    Transfers  Sit to Stand;Stand to Sit    Five time sit to stand comments   10.2  (Parkinson's >16 sec indicates incr fall risk) chair no UEs     Ambulation/Gait   Ambulation/Gait  Yes    Ambulation/Gait Assistance  7: Independent    Assistive device  None    Gait Pattern  Step-through pattern;Decreased arm swing - right;Decreased arm swing - left    Gait velocity  32.8/9.35=3.51 ft/sec      Standardized Balance Assessment   Standardized Balance Assessment  Timed Up and Go Test      Timed Up and Go Test   Normal TUG (seconds)  10    Cognitive TUG (seconds)  10.72   (>10% incr indicative of fall risk; pt incr 7%)     Functional Gait  Assessment   Gait Level Surface  Walks 20 ft in less than 5.5 sec, no assistive devices, good speed, no evidence for imbalance, normal gait pattern, deviates no more than 6 in outside of the 12 in walkway width.    Change in Gait Speed  Able to smoothly change walking speed without loss of balance or gait deviation. Deviate no more than 6 in outside of the 12 in walkway width.    Gait with Horizontal Head Turns  Performs head turns smoothly with slight change in gait velocity (eg, minor disruption to smooth gait path), deviates 6-10 in outside 12 in walkway width, or uses an assistive device.    Gait with Vertical Head Turns  Performs head turns with no change in gait. Deviates no more than 6 in outside 12 in walkway width.    Gait and Pivot Turn  Pivot turns safely within 3 sec and stops quickly with no loss of balance.    Step Over Obstacle  Is able to step over 2 stacked shoe boxes taped together (9 in total height) without changing gait speed. No evidence of imbalance.    Gait with Narrow Base of Support  Is able to ambulate for 10 steps heel to toe with no staggering.    Gait with Eyes Closed  Walks 20  ft, uses assistive device, slower speed, mild gait deviations, deviates 6-10 in outside 12 in walkway width. Ambulates 20 ft in less than 9 sec but greater  than 7 sec.   6.4 sec   Ambulating Backwards  Walks 20 ft, slow speed, abnormal gait pattern, evidence for imbalance, deviates 10-15 in outside 12 in walkway width.    Steps  Alternating feet, no rail.    Total Score  26                Objective measurements completed on examination: See above findings.              PT Education - 05/22/18 0757    Education Details  results of evaluation (current scores do not indicate incr fall risk) however importance of exercise in slowing progression of PD and overview of importance of aerobic vs stretching vs Balance exercises and PT POC    Person(s) Educated  Patient    Methods  Explanation;Handout    Comprehension  Verbalized understanding;Need further instruction          PT Long Term Goals - 05/22/18 1657      PT LONG TERM GOAL #1   Title  Patient will be independent with HEP to address Parkinson's symptoms and be able to verbalize his plan for continued community-based exercise. (Target for all LTGs 06/20/2018)    Time  4    Period  Weeks    Status  New      PT LONG TERM GOAL #2   Title  Patient will ambulate >500 ft independently with upright posture, improved UE swing, and normal stride length.     Time  4    Period  Weeks    Status  New      PT LONG TERM GOAL #3   Title  Patient will verbalize understanding of fall prevention tips and complete a checklist for his home to assess potential risks.    Time  4    Period  Weeks    Status  New             Plan - 05/22/18 1648    Clinical Impression Statement  Patient referred to OPPT with h/o Parkinson's Disease and MD noted "progressive resting tremor left greater than right upper extremities, bilateral lower extremities, with cognitive rigidity, bradykinesia, stooped posture." Patient reports no current exercise program and was not aware of the strong research evidence of the benefits of exercise for patiens with Parkinson's Disease. His outcome measures do  not currently show him to be at a fall risk. He is agreeable to a short course of PT to learn appropriate exercises to address his Parkinson's symptoms. Pt will benefit from the PT interventions listed below to address the additional deficits listed below.     History and Personal Factors relevant to plan of care:  PMH-PD    Clinical Presentation  Evolving    Clinical Presentation due to:  MD notes progression of Parkinson's symptoms to point of referring to PT; neurodegenerative process    Clinical Decision Making  Low    Rehab Potential  Good    PT Frequency  2x / week    PT Duration  4 weeks    PT Treatment/Interventions  ADLs/Self Care Home Management;Gait training;Stair training;Functional mobility training;Therapeutic activities;Therapeutic exercise;Balance training;Patient/family education;Cognitive remediation;Neuromuscular re-education;Manual techniques;Passive range of motion    PT Next Visit Plan  Initiate HEP-?warm up on stepper and then introduce supine PWR? add heel cord and  hamstring stretches (if indicated), educate on walking program; eventually ?try treadmill    Consulted and Agree with Plan of Care  Patient       Patient will benefit from skilled therapeutic intervention in order to improve the following deficits and impairments:  Abnormal gait, Decreased coordination, Decreased cognition, Impaired flexibility, Impaired UE functional use, Postural dysfunction  Visit Diagnosis: Other symptoms and signs involving the nervous system - Plan: PT plan of care cert/re-cert  Other abnormalities of gait and mobility - Plan: PT plan of care cert/re-cert  Abnormal posture - Plan: PT plan of care cert/re-cert     Problem List Patient Active Problem List   Diagnosis Date Noted  . Major depressive disorder, recurrent episode, unspecified 08/18/2013  . Major depressive disorder, single episode 06/17/2013  . Mood disorder in conditions classified elsewhere 03/31/2013  . Parkinson's  disease ([place]) 01/19/2013    Rexanne Mano, PT 05/22/2018, 5:06 PM  [facility] [address] [place], [place], [address] Phone: [phone]   Fax:  [phone]  Name: Lee Martinez MRN: [id number] Date of Birth: January 13, 1936

## 2018-06-18 ENCOUNTER — Other Ambulatory Visit: Payer: Self-pay | Admitting: Nurse Practitioner

## 2018-06-22 ENCOUNTER — Ambulatory Visit: Payer: Medicare Other | Admitting: Physical Therapy

## 2018-06-22 ENCOUNTER — Other Ambulatory Visit: Payer: Self-pay | Admitting: Nurse Practitioner

## 2018-06-22 MED ORDER — SERTRALINE HCL 50 MG PO TABS: 50.0000 mg | ORAL_TABLET | Freq: Every day | ORAL | 0 refills | Status: DC

## 2018-06-26 ENCOUNTER — Ambulatory Visit: Payer: Medicare Other | Admitting: Physical Therapy

## 2018-06-29 ENCOUNTER — Ambulatory Visit: Payer: Self-pay | Admitting: Physical Therapy

## 2018-07-02 ENCOUNTER — Ambulatory Visit: Payer: Self-pay | Admitting: Physical Therapy

## 2018-07-06 ENCOUNTER — Ambulatory Visit: Payer: Self-pay | Admitting: Physical Therapy

## 2018-07-09 ENCOUNTER — Ambulatory Visit: Payer: Self-pay | Admitting: Physical Therapy

## 2018-07-13 ENCOUNTER — Ambulatory Visit: Payer: Self-pay | Admitting: Physical Therapy

## 2018-07-16 ENCOUNTER — Ambulatory Visit: Payer: Self-pay | Admitting: Physical Therapy

## 2018-07-23 ENCOUNTER — Other Ambulatory Visit: Payer: Self-pay

## 2018-07-23 ENCOUNTER — Ambulatory Visit (INDEPENDENT_AMBULATORY_CARE_PROVIDER_SITE_OTHER): Payer: Medicare Other | Admitting: Nurse Practitioner

## 2018-07-23 ENCOUNTER — Other Ambulatory Visit: Payer: Self-pay | Admitting: Nurse Practitioner

## 2018-07-23 ENCOUNTER — Ambulatory Visit: Payer: Self-pay | Admitting: Nurse Practitioner

## 2018-07-23 ENCOUNTER — Encounter: Payer: Self-pay | Admitting: Nurse Practitioner

## 2018-07-23 VITALS — BP 110/76 | HR 82 | Ht 72.0 in | Wt 153.4 lb

## 2018-07-23 DIAGNOSIS — F418 Other specified anxiety disorders: Secondary | ICD-10-CM

## 2018-07-23 DIAGNOSIS — E782 Mixed hyperlipidemia: Secondary | ICD-10-CM | POA: Diagnosis not present

## 2018-07-23 MED ORDER — MIRTAZAPINE 45 MG PO TABS: 45.0000 mg | ORAL_TABLET | Freq: Every day | ORAL | 1 refills | Status: DC

## 2018-07-23 NOTE — Progress Notes (Addendum)
Subjective:     Patient ID: Lee Martinez , male    DOB: August 06, 1935 , 83 y.o.   MRN: [id number]   Chief Complaint  Patient presents with  . med check    HPI  Seen Dr. Leta Baptist.  He was referred for PT but cancelled.    Here for hyperlipidemia f/u - he is not taking any medications for this.  He denies eating increased amount of fried and fatty foods.    Past Medical History:  Diagnosis Date  . Parkinson's disease ([place]) 07/2011   followed by [facility]     Family History  Problem Relation Age of Onset  . Heart failure Mother   . Breast cancer Sister      Current Outpatient Medications:  .  carbidopa-levodopa (SINEMET IR) 25-100 MG tablet, Take 2 tablets by mouth 4 (four) times daily., Disp: 720 tablet, Rfl: 4 .  cholecalciferol (VITAMIN D) 1000 UNITS tablet, Take 1,000 Units by mouth daily., Disp: , Rfl:  .  fish oil-omega-3 fatty acids 1000 MG capsule, Take 2 g by mouth daily., Disp: , Rfl:  .  Multiple Vitamin (MULTIVITAMIN) tablet, Take 1 tablet by mouth daily., Disp: , Rfl:  .  saw palmetto 160 MG capsule, Take 160 mg by mouth 2 (two) times daily., Disp: , Rfl:  .  UNABLE TO FIND, Med Name: Gaba, natural herb, Disp: , Rfl:  .  mirtazapine (REMERON) 45 MG tablet, Take 1 tablet (45 mg total) by mouth at bedtime., Disp: 90 tablet, Rfl: 1   No Known Allergies   Review of Systems  Constitutional: Negative for fatigue.  Respiratory: Negative for cough.   Cardiovascular: Negative for chest pain, palpitations and leg swelling.  Endocrine: Negative for polydipsia, polyphagia and polyuria.  Neurological: Negative for dizziness and headaches.     Today's Vitals   07/23/18 1550  BP: 110/76  Pulse: 82  SpO2: 96%  Weight: 153 lb 6.4 oz (69.6 kg)  Height: 6' (1.829 m)   Body mass index is 20.8 kg/m.   Objective:  Physical Exam Vitals signs reviewed.  Constitutional:      Appearance: Normal appearance.  Cardiovascular:     Rate and Rhythm: Normal rate and  regular rhythm.     Pulses: Normal pulses.     Heart sounds: Normal heart sounds. No murmur.  Pulmonary:     Effort: Pulmonary effort is normal. No respiratory distress.     Breath sounds: Normal breath sounds. No wheezing or rales.  Skin:    General: Skin is warm and dry.     Capillary Refill: Capillary refill takes less than 2 seconds.  Neurological:     General: No focal deficit present.     Mental Status: He is alert and oriented to person, place, and time.  Psychiatric:        Mood and Affect: Mood normal.        Behavior: Behavior normal.        Thought Content: Thought content normal.        Judgment: Judgment normal.         Assessment And Plan:     1. Depression with anxiety  Doing well no concerns  Continue current medications - mirtazapine (REMERON) 45 MG tablet; Take 1 tablet (45 mg total) by mouth at bedtime.  Dispense: 90 tablet; Refill: 1 - BMP8+eGFR  2. Mixed hyperlipidemia  Chronic, poorly controlled  No current medications patient refuses to take any statins  Discussed in detail risk  factors for heart disease, strokes and MI's, embolisms related to hyperlipidemia - BMP8+eGFR - Lipid panel    [name], FNP

## 2018-07-24 LAB — LIPID PANEL
Chol/HDL Ratio: 6.8 ratio — ABNORMAL HIGH (ref 0.0–5.0)
Cholesterol, Total: 243 mg/dL — ABNORMAL HIGH (ref 100–199)
HDL: 36 mg/dL — ABNORMAL LOW (ref >39)
LDL Calculated: 171 mg/dL — ABNORMAL HIGH (ref 0–99)
Triglycerides: 178 mg/dL — ABNORMAL HIGH (ref 0–149)
VLDL Cholesterol Cal: 36 mg/dL (ref 5–40)

## 2018-07-24 LAB — BMP8+EGFR
BUN/Creatinine Ratio: 14 (ref 10–24)
BUN: 16 mg/dL (ref 8–27)
CO2: 25 mmol/L (ref 20–29)
Calcium: 9.7 mg/dL (ref 8.6–10.2)
Chloride: 102 mmol/L (ref 96–106)
Creatinine, Ser: 1.11 mg/dL (ref 0.76–1.27)
GFR calc Af Amer: 72 mL/min/{1.73_m2} (ref >59)
GFR calc non Af Amer: 62 mL/min/{1.73_m2} (ref >59)
GLUCOSE: 100 mg/dL — AB (ref 65–99)
POTASSIUM: 4.3 mmol/L (ref 3.5–5.2)
Sodium: 141 mmol/L (ref 134–144)

## 2018-08-14 ENCOUNTER — Encounter: Payer: Self-pay | Admitting: Nurse Practitioner

## 2018-08-30 ENCOUNTER — Other Ambulatory Visit: Payer: Self-pay | Admitting: Nurse Practitioner

## 2018-10-14 ENCOUNTER — Telehealth: Payer: Self-pay | Admitting: Internal Medicine

## 2018-10-14 NOTE — Telephone Encounter (Signed)
I called the patient to reschedule AWV to an earlier date.  He said that they already had the 2020 AWV when [facility] came to their home on 08/07/2018. VDM (DD)

## 2018-10-31 ENCOUNTER — Other Ambulatory Visit: Payer: Self-pay | Admitting: Nurse Practitioner

## 2018-10-31 DIAGNOSIS — F418 Other specified anxiety disorders: Secondary | ICD-10-CM

## 2018-11-02 ENCOUNTER — Other Ambulatory Visit: Payer: Self-pay | Admitting: Nurse Practitioner

## 2018-11-02 ENCOUNTER — Other Ambulatory Visit: Payer: Self-pay

## 2018-11-02 DIAGNOSIS — F418 Other specified anxiety disorders: Secondary | ICD-10-CM

## 2018-11-02 MED ORDER — LORAZEPAM 0.5 MG PO TABS: ORAL_TABLET | ORAL | 2 refills | Status: DC

## 2018-11-02 NOTE — Telephone Encounter (Signed)
lorazepam refill

## 2018-12-17 ENCOUNTER — Other Ambulatory Visit: Payer: Self-pay | Admitting: Nurse Practitioner

## 2018-12-17 DIAGNOSIS — F418 Other specified anxiety disorders: Secondary | ICD-10-CM

## 2019-01-28 ENCOUNTER — Telehealth: Payer: Self-pay

## 2019-01-28 ENCOUNTER — Ambulatory Visit: Payer: Self-pay

## 2019-01-28 ENCOUNTER — Ambulatory Visit: Payer: Self-pay | Admitting: Nurse Practitioner

## 2019-01-28 ENCOUNTER — Encounter: Payer: Self-pay | Admitting: Nurse Practitioner

## 2019-01-28 NOTE — Telephone Encounter (Signed)
Patient called wanting to cancel his appointment.   I HAVE RETURNED HIS CALL AND ATTEMPTED TO RESCHEDULE HIS APPOINTMENT SINCE IT WAS FOR HIS PHYSICAL AND AWV AND HE DECLINED AT THIS TIME SAID HE WOULD THINK ABOUT IT AND CALL BACK. YRL,RMA

## 2019-04-05 DIAGNOSIS — H04123 Dry eye syndrome of bilateral lacrimal glands: Secondary | ICD-10-CM | POA: Diagnosis not present

## 2019-04-05 DIAGNOSIS — H31092 Other chorioretinal scars, left eye: Secondary | ICD-10-CM | POA: Diagnosis not present

## 2019-04-05 DIAGNOSIS — H2513 Age-related nuclear cataract, bilateral: Secondary | ICD-10-CM | POA: Diagnosis not present

## 2019-04-06 ENCOUNTER — Ambulatory Visit (INDEPENDENT_AMBULATORY_CARE_PROVIDER_SITE_OTHER): Payer: Medicare Other

## 2019-04-06 ENCOUNTER — Ambulatory Visit (INDEPENDENT_AMBULATORY_CARE_PROVIDER_SITE_OTHER): Payer: Medicare Other | Admitting: Nurse Practitioner

## 2019-04-06 ENCOUNTER — Encounter: Payer: Self-pay | Admitting: Nurse Practitioner

## 2019-04-06 ENCOUNTER — Other Ambulatory Visit: Payer: Self-pay

## 2019-04-06 VITALS — BP 110/62 | HR 94 | Temp 98.6°F | Ht 69.6 in | Wt 159.0 lb

## 2019-04-06 DIAGNOSIS — R234 Changes in skin texture: Secondary | ICD-10-CM | POA: Diagnosis not present

## 2019-04-06 DIAGNOSIS — G20 Parkinson's disease: Secondary | ICD-10-CM

## 2019-04-06 DIAGNOSIS — Z139 Encounter for screening, unspecified: Secondary | ICD-10-CM

## 2019-04-06 DIAGNOSIS — F418 Other specified anxiety disorders: Secondary | ICD-10-CM

## 2019-04-06 DIAGNOSIS — E782 Mixed hyperlipidemia: Secondary | ICD-10-CM | POA: Diagnosis not present

## 2019-04-06 DIAGNOSIS — Z0000 Encounter for general adult medical examination without abnormal findings: Secondary | ICD-10-CM

## 2019-04-06 LAB — POCT URINALYSIS DIPSTICK
Bilirubin, UA: NEGATIVE
Blood, UA: NEGATIVE
Glucose, UA: NEGATIVE
Ketones, UA: 15
Leukocytes, UA: NEGATIVE
Nitrite, UA: NEGATIVE
Protein, UA: POSITIVE — AB
Spec Grav, UA: 1.025 (ref 1.010–1.025)
Urobilinogen, UA: 0.2 E.U./dL
pH, UA: 5.5 (ref 5.0–8.0)

## 2019-04-06 MED ORDER — LORAZEPAM 0.5 MG PO TABS: ORAL_TABLET | ORAL | 5 refills | Status: DC

## 2019-04-06 NOTE — Progress Notes (Signed)
Subjective:     Patient ID: Lee Martinez , male    DOB: Mar 26, 1936 , 83 y.o.   MRN: [id number]   Chief Complaint  Patient presents with  . Annual Exam    HPI  Here for HM  Parkinson's disease - he is supposed to see Dr. Leta Baptist in December.  Denies any issues or concerns.     Men's preventive visit. Patient Health Questionnaire (PHQ-2) is    Clinical Support from 04/06/2019 in [facility]  PHQ-2 Total Score  0     Patient is on a Regular diet. Marital status: Married. Relevant history for alcohol use is:  Social History   Substance and Sexual Activity  Alcohol Use Yes  . Alcohol/week: 1.0 standard drinks  . Types: 1 Shots of liquor per week   Relevant history for tobacco use is:  Social History   Tobacco Use  Smoking Status Never Smoker  Smokeless Tobacco Never Used  . Past Medical History:  Diagnosis Date  . Parkinson's disease ([place]) 07/2011   followed by [facility]     Family History  Problem Relation Age of Onset  . Heart failure Mother   . Breast cancer Sister     Current Outpatient Medications:  .  calcium gluconate 500 MG tablet, Take 1 tablet by mouth daily., Disp: , Rfl:  .  carbidopa-levodopa (SINEMET IR) 25-100 MG tablet, Take 2 tablets by mouth 4 (four) times daily., Disp: 720 tablet, Rfl: 4 .  cholecalciferol (VITAMIN D) 1000 UNITS tablet, Take 1,000 Units by mouth daily., Disp: , Rfl:  .  fish oil-omega-3 fatty acids 1000 MG capsule, Take 2 g by mouth daily., Disp: , Rfl:  .  LORazepam (ATIVAN) 0.5 MG tablet, TAKE 1 TABLET BY MOUTH TWO  TIMES DAILY, Disp: 60 tablet, Rfl: 2 .  magnesium 30 MG tablet, Take 30 mg by mouth daily., Disp: , Rfl:  .  mirtazapine (REMERON) 45 MG tablet, TAKE 1 TABLET BY MOUTH AT  BEDTIME, Disp: 90 tablet, Rfl: 3 .  Multiple Vitamin (MULTIVITAMIN) tablet, Take 1 tablet by mouth daily., Disp: , Rfl:  .  saw palmetto 160 MG capsule, Take 160 mg by mouth 2 (two) times daily., Disp: , Rfl:  .   sertraline (ZOLOFT) 50 MG tablet, TAKE 1 TABLET BY MOUTH  DAILY (Patient not taking: Reported on 04/06/2019), Disp: 90 tablet, Rfl: 3 .  UNABLE TO FIND, Med Name: Gaba, natural herb, Disp: , Rfl:    No Known Allergies   Review of Systems  Constitutional: Negative.   HENT: Negative.   Eyes: Negative.   Respiratory: Negative.   Cardiovascular: Negative.   Gastrointestinal: Negative.   Endocrine: Negative.   Genitourinary: Negative.   Musculoskeletal: Negative.   Skin: Negative.   Allergic/Immunologic: Negative.   Neurological: Positive for tremors (History or Parkinsons, he admits to them being worse today).  Hematological: Negative.   Psychiatric/Behavioral: Negative.      Today's Vitals   04/06/19 1438  BP: 110/62  Pulse: 94  Temp: 98.6 F (37 C)  TempSrc: Oral  Weight: 159 lb (72.1 kg)  Height: 5' 9.6" (1.768 m)  PainSc: 0-No pain   Body mass index is 23.08 kg/m.   Objective:  Physical Exam Vitals signs reviewed.  Constitutional:      Appearance: Normal appearance. He is obese.  HENT:     Head: Normocephalic and atraumatic.  Eyes:     Extraocular Movements: Extraocular movements intact.     Conjunctiva/sclera: Conjunctivae  normal.     Pupils: Pupils are equal, round, and reactive to light.  Neck:     Musculoskeletal: Normal range of motion and neck supple.  Cardiovascular:     Rate and Rhythm: Normal rate and regular rhythm.     Pulses: Normal pulses.     Heart sounds: Normal heart sounds. No murmur.  Pulmonary:     Effort: Pulmonary effort is normal. No respiratory distress.     Breath sounds: Normal breath sounds.  Abdominal:     General: Abdomen is flat. Bowel sounds are normal. There is no distension.     Palpations: Abdomen is soft.  Genitourinary:    Prostate: Normal.     Rectum: Guaiac result negative.  Musculoskeletal: Normal range of motion.  Skin:    General: Skin is warm.     Capillary Refill: Capillary refill takes less than 2 seconds.      Comments: He has dry scaly skin to his lower extremities and bilateral arms have multiple hyperpigmented ares.    Neurological:     General: No focal deficit present.     Mental Status: He is alert and oriented to person, place, and time.     Comments: He has significant bilateral hand tremors, worse today than I have seen him in the past  Psychiatric:        Mood and Affect: Mood normal.        Behavior: Behavior normal.        Thought Content: Thought content normal.        Judgment: Judgment normal.         Assessment And Plan:     1. Encounter for general adult medical examination w/o abnormal findings . Behavior modifications discussed and diet history reviewed.   . Pt will continue to exercise regularly and modify diet with low GI, plant based foods and decrease intake of processed foods.  . Recommend intake of daily multivitamin, Vitamin D, and calcium.  . Recommend for preventive screenings, as well as recommend immunizations that include influenza, TDAP - POCT Urinalysis Dipstick (81002)  2. Depression with anxiety  Chronic, stable  Continue medications as directed, reviewed PDMP - CMP14+EGFR - LORazepam (ATIVAN) 0.5 MG tablet; TAKE 1 TABLET BY MOUTH ONE TIME A DAY  Dispense: 30 tablet; Refill: 5  3. Mixed hyperlipidemia  Chronic, elevated, he is declining a statin depending on the lab results will consider a once a week statin.   - Lipid Profile  4. Parkinson's disease ([place])  Chronic, tremors are slightly worse today than has been previously  He is scheduled to see Dr Leta Baptist in December  The patient does admit it is near the time he is due for his medication  5. Encounter for screening  He is fair skinned and he has multiple scaly areas to bilateral lower and upper extremities and brown pigmented areas to his arms bilaterally  - Ambulatory referral to Dermatology   [name], FNP    THE PATIENT IS ENCOURAGED TO PRACTICE SOCIAL DISTANCING DUE TO  THE COVID-19 PANDEMIC.

## 2019-04-06 NOTE — Patient Instructions (Signed)
Health Maintenance After Age 81 After age 81, you are at a higher risk for certain long-term diseases and infections as well as injuries from falls. Falls are a major cause of broken bones and head injuries in people who are older than age 81. Getting regular preventive care can help to keep you healthy and well. Preventive care includes getting regular testing and making lifestyle changes as recommended by your health care provider. Talk with your health care provider about:  Which screenings and tests you should have. A screening is a test that checks for a disease when you have no symptoms.  A diet and exercise plan that is right for you. What should I know about screenings and tests to prevent falls? Screening and testing are the best ways to find a health problem early. Early diagnosis and treatment give you the best chance of managing medical conditions that are common after age 81. Certain conditions and lifestyle choices may make you more likely to have a fall. Your health care provider may recommend:  Regular vision checks. Poor vision and conditions such as cataracts can make you more likely to have a fall. If you wear glasses, make sure to get your prescription updated if your vision changes.  Medicine review. Work with your health care provider to regularly review all of the medicines you are taking, including over-the-counter medicines. Ask your health care provider about any side effects that may make you more likely to have a fall. Tell your health care provider if any medicines that you take make you feel dizzy or sleepy.  Osteoporosis screening. Osteoporosis is a condition that causes the bones to get weaker. This can make the bones weak and cause them to break more easily.  Blood pressure screening. Blood pressure changes and medicines to control blood pressure can make you feel dizzy.  Strength and balance checks. Your health care provider may recommend certain tests to check your  strength and balance while standing, walking, or changing positions.  Foot health exam. Foot pain and numbness, as well as not wearing proper footwear, can make you more likely to have a fall.  Depression screening. You may be more likely to have a fall if you have a fear of falling, feel emotionally low, or feel unable to do activities that you used to do.  Alcohol use screening. Using too much alcohol can affect your balance and may make you more likely to have a fall. What actions can I take to lower my risk of falls? General instructions  Talk with your health care provider about your risks for falling. Tell your health care provider if: ? You fall. Be sure to tell your health care provider about all falls, even ones that seem minor. ? You feel dizzy, sleepy, or off-balance.  Take over-the-counter and prescription medicines only as told by your health care provider. These include any supplements.  Eat a healthy diet and maintain a healthy weight. A healthy diet includes low-fat dairy products, low-fat (lean) meats, and fiber from whole grains, beans, and lots of fruits and vegetables. Home safety  Remove any tripping hazards, such as rugs, cords, and clutter.  Install safety equipment such as grab bars in bathrooms and safety rails on stairs.  Keep rooms and walkways well-lit. Activity   Follow a regular exercise program to stay fit. This will help you maintain your balance. Ask your health care provider what types of exercise are appropriate for you.  If you need a cane or   walker, use it as recommended by your health care provider.  Wear supportive shoes that have nonskid soles. Lifestyle  Do not drink alcohol if your health care provider tells you not to drink.  If you drink alcohol, limit how much you have: ? 0-1 drink a day for women. ? 0-2 drinks a day for men.  Be aware of how much alcohol is in your drink. In the [place] one drink equals one typical bottle of beer (12  oz), one-half glass of wine (5 oz), or one shot of hard liquor (1 oz).  Do not use any products that contain nicotine or tobacco, such as cigarettes and e-cigarettes. If you need help quitting, ask your health care provider. Summary  Having a healthy lifestyle and getting preventive care can help to protect your health and wellness after age 81.  Screening and testing are the best way to find a health problem early and help you avoid having a fall. Early diagnosis and treatment give you the best chance for managing medical conditions that are more common for people who are older than age 81.  Falls are a major cause of broken bones and head injuries in people who are older than age 81. Take precautions to prevent a fall at home.  Work with your health care provider to learn what changes you can make to improve your health and wellness and to prevent falls. This information is not intended to replace advice given to you by your health care provider. Make sure you discuss any questions you have with your health care provider. Document Released: 03/12/2017 Document Revised: 08/20/2018 Document Reviewed: 03/12/2017 Elsevier Patient Education  2020 Elsevier Inc.  

## 2019-04-06 NOTE — Patient Instructions (Signed)
Lee Martinez , Thank you for taking time to come for your Medicare Wellness Visit. I appreciate your ongoing commitment to your health goals. Please review the following plan we discussed and let me know if I can assist you in the future.   Screening recommendations/referrals: Colonoscopy: not required Recommended yearly ophthalmology/optometry visit for glaucoma screening and checkup Recommended yearly dental visit for hygiene and checkup  Vaccinations: Influenza vaccine: declined Pneumococcal vaccine: declined Tdap vaccine: 11/2010 Shingles vaccine: discussed    Advanced directives: copy in chart  Conditions/risks identified: Parkinson's disease  Next appointment: 10/05/2019 at 2:15  Preventive Care 39 Years and Older, Male Preventive care refers to lifestyle choices and visits with your health care provider that can promote health and wellness. What does preventive care include?  A yearly physical exam. This is also called an annual well check.  Dental exams once or twice a year.  Routine eye exams. Ask your health care provider how often you should have your eyes checked.  Personal lifestyle choices, including:  Daily care of your teeth and gums.  Regular physical activity.  Eating a healthy diet.  Avoiding tobacco and drug use.  Limiting alcohol use.  Practicing safe sex.  Taking low doses of aspirin every day.  Taking vitamin and mineral supplements as recommended by your health care provider. What happens during an annual well check? The services and screenings done by your health care provider during your annual well check will depend on your age, overall health, lifestyle risk factors, and family history of disease. Counseling  Your health care provider may ask you questions about your:  Alcohol use.  Tobacco use.  Drug use.  Emotional well-being.  Home and relationship well-being.  Sexual activity.  Eating habits.  History of falls.  Memory and  ability to understand (cognition).  Work and work Statistician. Screening  You may have the following tests or measurements:  Height, weight, and BMI.  Blood pressure.  Lipid and cholesterol levels. These may be checked every 5 years, or more frequently if you are over 108 years old.  Skin check.  Lung cancer screening. You may have this screening every year starting at age 6 if you have a 30-pack-year history of smoking and currently smoke or have quit within the past 15 years.  Fecal occult blood test (FOBT) of the stool. You may have this test every year starting at age 71.  Flexible sigmoidoscopy or colonoscopy. You may have a sigmoidoscopy every 5 years or a colonoscopy every 10 years starting at age 4.  Prostate cancer screening. Recommendations will vary depending on your family history and other risks.  Hepatitis C blood test.  Hepatitis B blood test.  Sexually transmitted disease (STD) testing.  Diabetes screening. This is done by checking your blood sugar (glucose) after you have not eaten for a while (fasting). You may have this done every 1-3 years.  Abdominal aortic aneurysm (AAA) screening. You may need this if you are a current or former smoker.  Osteoporosis. You may be screened starting at age 41 if you are at high risk. Talk with your health care provider about your test results, treatment options, and if necessary, the need for more tests. Vaccines  Your health care provider may recommend certain vaccines, such as:  Influenza vaccine. This is recommended every year.  Tetanus, diphtheria, and acellular pertussis (Tdap, Td) vaccine. You may need a Td booster every 10 years.  Zoster vaccine. You may need this after age 48.  Pneumococcal  13-valent conjugate (PCV13) vaccine. One dose is recommended after age 71.  Pneumococcal polysaccharide (PPSV23) vaccine. One dose is recommended after age 67. Talk to your health care provider about which screenings and  vaccines you need and how often you need them. This information is not intended to replace advice given to you by your health care provider. Make sure you discuss any questions you have with your health care provider. Document Released: 05/26/2015 Document Revised: 01/17/2016 Document Reviewed: 02/28/2015 Elsevier Interactive Patient Education  2017 [place] Prevention in the Home Falls can cause injuries. They can happen to people of all ages. There are many things you can do to make your home safe and to help prevent falls. What can I do on the outside of my home?  Regularly fix the edges of walkways and driveways and fix any cracks.  Remove anything that might make you trip as you walk through a door, such as a raised step or threshold.  Trim any bushes or trees on the path to your home.  Use bright outdoor lighting.  Clear any walking paths of anything that might make someone trip, such as rocks or tools.  Regularly check to see if handrails are loose or broken. Make sure that both sides of any steps have handrails.  Any raised decks and porches should have guardrails on the edges.  Have any leaves, snow, or ice cleared regularly.  Use sand or salt on walking paths during winter.  Clean up any spills in your garage right away. This includes oil or grease spills. What can I do in the bathroom?  Use night lights.  Install grab bars by the toilet and in the tub and shower. Do not use towel bars as grab bars.  Use non-skid mats or decals in the tub or shower.  If you need to sit down in the shower, use a plastic, non-slip stool.  Keep the floor dry. Clean up any water that spills on the floor as soon as it happens.  Remove soap buildup in the tub or shower regularly.  Attach bath mats securely with double-sided non-slip rug tape.  Do not have throw rugs and other things on the floor that can make you trip. What can I do in the bedroom?  Use night lights.   Make sure that you have a light by your bed that is easy to reach.  Do not use any sheets or blankets that are too big for your bed. They should not hang down onto the floor.  Have a firm chair that has side arms. You can use this for support while you get dressed.  Do not have throw rugs and other things on the floor that can make you trip. What can I do in the kitchen?  Clean up any spills right away.  Avoid walking on wet floors.  Keep items that you use a lot in easy-to-reach places.  If you need to reach something above you, use a strong step stool that has a grab bar.  Keep electrical cords out of the way.  Do not use floor polish or wax that makes floors slippery. If you must use wax, use non-skid floor wax.  Do not have throw rugs and other things on the floor that can make you trip. What can I do with my stairs?  Do not leave any items on the stairs.  Make sure that there are handrails on both sides of the stairs and use them. Fix  handrails that are broken or loose. Make sure that handrails are as long as the stairways.  Check any carpeting to make sure that it is firmly attached to the stairs. Fix any carpet that is loose or worn.  Avoid having throw rugs at the top or bottom of the stairs. If you do have throw rugs, attach them to the floor with carpet tape.  Make sure that you have a light switch at the top of the stairs and the bottom of the stairs. If you do not have them, ask someone to add them for you. What else can I do to help prevent falls?  Wear shoes that:  Do not have high heels.  Have rubber bottoms.  Are comfortable and fit you well.  Are closed at the toe. Do not wear sandals.  If you use a stepladder:  Make sure that it is fully opened. Do not climb a closed stepladder.  Make sure that both sides of the stepladder are locked into place.  Ask someone to hold it for you, if possible.  Clearly mark and make sure that you can see:  Any  grab bars or handrails.  First and last steps.  Where the edge of each step is.  Use tools that help you move around (mobility aids) if they are needed. These include:  Canes.  Walkers.  Scooters.  Crutches.  Turn on the lights when you go into a dark area. Replace any light bulbs as soon as they burn out.  Set up your furniture so you have a clear path. Avoid moving your furniture around.  If any of your floors are uneven, fix them.  If there are any pets around you, be aware of where they are.  Review your medicines with your doctor. Some medicines can make you feel dizzy. This can increase your chance of falling. Ask your doctor what other things that you can do to help prevent falls. This information is not intended to replace advice given to you by your health care provider. Make sure you discuss any questions you have with your health care provider. Document Released: 02/23/2009 Document Revised: 10/05/2015 Document Reviewed: 06/03/2014 Elsevier Interactive Patient Education  2017 [employer].

## 2019-04-06 NOTE — Progress Notes (Signed)
Subjective:   Lee Martinez is a 83 y.o. male who presents for Medicare Annual/Subsequent preventive examination.  This visit occurred during the SARS-CoV-2 public health emergency.  Safety protocols were in place, including screening questions prior to the visit, additional usage of staff PPE, and extensive cleaning of exam room while observing appropriate contact time as indicated for disinfecting solutions.   Review of Systems:  n/a Cardiac Risk Factors include: advanced age (>27men, >78 women);male gender;sedentary lifestyle     Objective:    Vitals: BP 110/62 (BP Location: Left Arm, Patient Position: Sitting, Cuff Size: Normal)   Pulse 94   Temp 98.6 F (37 C) (Oral)   Ht 5' 9.6" (1.768 m)   Wt 159 lb (72.1 kg)   SpO2 94%   BMI 23.08 kg/m   Body mass index is 23.08 kg/m.  Advanced Directives 04/06/2019 05/21/2018 12/12/2014  Does Patient Have a Medical Advance Directive? Yes Yes Yes  Type of Paramedic of [place];Living will Living will Living will;Healthcare Power of Attorney  Does patient want to make changes to medical advance directive? - No - Patient declined -  Copy of [place] in Chart? Yes - validated most recent copy scanned in chart (See row information) - No - copy requested  Some encounter information is confidential and restricted. Go to Review Flowsheets activity to see all data.    Tobacco Social History   Tobacco Use  Smoking Status Never Smoker  Smokeless Tobacco Never Used     Counseling given: Not Answered   Clinical Intake:  Pre-visit preparation completed: Yes  Pain : No/denies pain     Nutritional Status: BMI of 19-24  Normal Nutritional Risks: None Diabetes: No  How often do you need to have someone help you when you read instructions, pamphlets, or other written materials from your doctor or pharmacy?: 1 - Never What is the last grade level you completed in school?: 12th grade   Interpreter Needed?: No  Information entered by :: NAllen LPN  Past Medical History:  Diagnosis Date  . Parkinson's disease ([place]) 07/2011   followed by [facility] Neurology   Past Surgical History:  Procedure Laterality Date  . basil removal    . HERNIA REPAIR    . THYROID SURGERY     Family History  Problem Relation Age of Onset  . Heart failure Mother   . Breast cancer Sister    Social History   Socioeconomic History  . Marital status: Married    Spouse name: Lee Martinez  . Number of children: 0  . Years of education: 52  . Highest education level: Not on file  Occupational History  . Occupation: retired    Fish farm manager: RETIRED  Social Needs  . Financial resource strain: Not hard at all  . Food insecurity    Worry: Never true    Inability: Never true  . Transportation needs    Medical: No    Non-medical: No  Tobacco Use  . Smoking status: Never Smoker  . Smokeless tobacco: Never Used  Substance and Sexual Activity  . Alcohol use: Yes    Alcohol/week: 1.0 standard drinks    Types: 1 Shots of liquor per week  . Drug use: No  . Sexual activity: Not Currently    Partners: Female  Lifestyle  . Physical activity    Days per week: 0 days    Minutes per session: 0 min  . Stress: Not at all  Relationships  .  Social Herbalist on phone: Not on file    Gets together: Not on file    Attends religious service: Not on file    Active member of club or organization: Not on file    Attends meetings of clubs or organizations: Not on file    Relationship status: Not on file  Other Topics Concern  . Not on file  Social History Narrative  . Not on file    Outpatient Encounter Medications as of 04/06/2019  Medication Sig  . calcium gluconate 500 MG tablet Take 1 tablet by mouth daily.  . carbidopa-levodopa (SINEMET IR) 25-100 MG tablet Take 2 tablets by mouth 4 (four) times daily.  . cholecalciferol (VITAMIN D) 1000 UNITS tablet Take 1,000 Units by mouth daily.  .  fish oil-omega-3 fatty acids 1000 MG capsule Take 2 g by mouth daily.  . magnesium 30 MG tablet Take 30 mg by mouth daily.  . mirtazapine (REMERON) 45 MG tablet TAKE 1 TABLET BY MOUTH AT  BEDTIME  . Multiple Vitamin (MULTIVITAMIN) tablet Take 1 tablet by mouth daily.  . saw palmetto 160 MG capsule Take 160 mg by mouth 2 (two) times daily.  Marland Kitchen UNABLE TO FIND Med Name: [name], natural herb  . [DISCONTINUED] LORazepam (ATIVAN) 0.5 MG tablet TAKE 1 TABLET BY MOUTH TWO  TIMES DAILY  . [DISCONTINUED] sertraline (ZOLOFT) 50 MG tablet TAKE 1 TABLET BY MOUTH  DAILY (Patient not taking: Reported on 04/06/2019)   No facility-administered encounter medications on file as of 04/06/2019.     Activities of Daily Living In your present state of health, do you have any difficulty performing the following activities: 04/06/2019  Hearing? N  Vision? N  Difficulty concentrating or making decisions? N  Walking or climbing stairs? N  Dressing or bathing? N  Doing errands, shopping? N  Preparing Food and eating ? N  Using the Toilet? N  In the past six months, have you accidently leaked urine? N  Do you have problems with loss of bowel control? N  Managing your Medications? N  Managing your Finances? N  Housekeeping or managing your Housekeeping? N  Some recent data might be hidden    Patient Care Team: [name], FNP as PCP - General (General Practice)   Assessment:   This is a routine wellness examination for Lee Martinez.  Exercise Activities and Dietary recommendations Current Exercise Habits: The patient does not participate in regular exercise at present  Goals    . Patient Stated     04/06/2019, no goals       Fall Risk Fall Risk  04/06/2019 06/16/2017 12/13/2016 05/27/2016 12/12/2014  Falls in the past year? 0 No No Yes No  Number falls in past yr: - - - 1 -  Injury with Fall? - - - No -  Risk for fall due to : Medication side effect - - - -  Follow up Falls evaluation completed;Education  provided;Falls prevention discussed - - - -   Is the patient's home free of loose throw rugs in walkways, pet beds, electrical cords, etc?   yes      Grab bars in the bathroom? no      Handrails on the stairs?   yes      Adequate lighting?   yes  Timed Get Up and Go Performed: n/a  Depression Screen PHQ 2/9 Scores 04/06/2019 07/23/2018 05/27/2016 06/27/2015  PHQ - 2 Score 0 0 0 0  PHQ- 9 Score 0 2 - -  Cognitive Function     6CIT Screen 04/06/2019  What Year? 0 points  What month? 0 points  What time? 0 points  Count back from 20 0 points  Months in reverse 2 points  Repeat phrase 6 points  Total Score 8    There is no immunization history for the selected administration types on file for this patient.  Qualifies for Shingles Vaccine? yes  Screening Tests Health Maintenance  Topic Date Due  . INFLUENZA VACCINE  08/11/2019 (Originally 12/12/2018)  . PNA vac Low Risk Adult (1 of 2 - PCV13) 04/05/2020 (Originally 03/20/2003)  . TETANUS/TDAP  09/26/2020   Cancer Screenings: Lung: Low Dose CT Chest recommended if Age 26-80 years, 30 pack-year currently smoking OR have quit w/in 15years. Patient does not qualify. Colorectal: not required  Additional Screenings:  Hepatitis C Screening:n/a      Plan:    Patient has no goals set at this time.  I have personally reviewed and noted the following in the patient's chart:   . Medical and social history . Use of alcohol, tobacco or illicit drugs  . Current medications and supplements . Functional ability and status . Nutritional status . Physical activity . Advanced directives . List of other physicians . Hospitalizations, surgeries, and ER visits in previous 12 months . Vitals . Screenings to include cognitive, depression, and falls . Referrals and appointments  In addition, I have reviewed and discussed with patient certain preventive protocols, quality metrics, and best practice recommendations. A written personalized  care plan for preventive services as well as general preventive health recommendations were provided to patient.     Kellie Simmering, LPN  [id number]

## 2019-04-07 LAB — CMP14+EGFR
ALT: 6 IU/L (ref 0–44)
AST: 17 IU/L (ref 0–40)
Albumin/Globulin Ratio: 1.8 (ref 1.2–2.2)
Albumin: 4.4 g/dL (ref 3.6–4.6)
Alkaline Phosphatase: 68 IU/L (ref 39–117)
BUN/Creatinine Ratio: 18 (ref 10–24)
BUN: 19 mg/dL (ref 8–27)
Bilirubin Total: 0.2 mg/dL (ref 0.0–1.2)
CO2: 26 mmol/L (ref 20–29)
Calcium: 10.3 mg/dL — ABNORMAL HIGH (ref 8.6–10.2)
Chloride: 101 mmol/L (ref 96–106)
Creatinine, Ser: 1.04 mg/dL (ref 0.76–1.27)
GFR calc Af Amer: 77 mL/min/{1.73_m2} (ref >59)
GFR calc non Af Amer: 67 mL/min/{1.73_m2} (ref >59)
Globulin, Total: 2.4 g/dL (ref 1.5–4.5)
Glucose: 89 mg/dL (ref 65–99)
Potassium: 4.6 mmol/L (ref 3.5–5.2)
Sodium: 141 mmol/L (ref 134–144)
Total Protein: 6.8 g/dL (ref 6.0–8.5)

## 2019-04-07 LAB — LIPID PANEL
Chol/HDL Ratio: 7.8 ratio — ABNORMAL HIGH (ref 0.0–5.0)
Cholesterol, Total: 256 mg/dL — ABNORMAL HIGH (ref 100–199)
HDL: 33 mg/dL — ABNORMAL LOW (ref >39)
LDL Chol Calc (NIH): 157 mg/dL — ABNORMAL HIGH (ref 0–99)
Triglycerides: 350 mg/dL — ABNORMAL HIGH (ref 0–149)
VLDL Cholesterol Cal: 66 mg/dL — ABNORMAL HIGH (ref 5–40)

## 2019-04-22 DIAGNOSIS — Z85828 Personal history of other malignant neoplasm of skin: Secondary | ICD-10-CM | POA: Diagnosis not present

## 2019-04-22 DIAGNOSIS — D225 Melanocytic nevi of trunk: Secondary | ICD-10-CM | POA: Diagnosis not present

## 2019-04-22 DIAGNOSIS — L218 Other seborrheic dermatitis: Secondary | ICD-10-CM | POA: Diagnosis not present

## 2019-04-22 DIAGNOSIS — D224 Melanocytic nevi of scalp and neck: Secondary | ICD-10-CM | POA: Diagnosis not present

## 2019-04-22 DIAGNOSIS — Z85820 Personal history of malignant melanoma of skin: Secondary | ICD-10-CM | POA: Diagnosis not present

## 2019-04-22 DIAGNOSIS — C44712 Basal cell carcinoma of skin of right lower limb, including hip: Secondary | ICD-10-CM | POA: Diagnosis not present

## 2019-04-26 ENCOUNTER — Encounter: Payer: Self-pay | Admitting: Diagnostic Neuroimaging

## 2019-04-26 ENCOUNTER — Other Ambulatory Visit: Payer: Self-pay

## 2019-04-26 ENCOUNTER — Ambulatory Visit: Payer: Medicare Other | Admitting: Diagnostic Neuroimaging

## 2019-04-26 VITALS — BP 128/72 | HR 84 | Temp 97.1°F | Ht 71.75 in | Wt 159.4 lb

## 2019-04-26 DIAGNOSIS — G20 Parkinson's disease: Secondary | ICD-10-CM | POA: Diagnosis not present

## 2019-04-26 DIAGNOSIS — F0630 Mood disorder due to known physiological condition, unspecified: Secondary | ICD-10-CM

## 2019-04-26 MED ORDER — CARBIDOPA-LEVODOPA 25-100 MG PO TABS: 2.0000 | ORAL_TABLET | Freq: Four times a day (QID) | ORAL | 4 refills | Status: DC

## 2019-04-26 MED ORDER — RASAGILINE MESYLATE 0.5 MG PO TABS: 0.5000 mg | ORAL_TABLET | Freq: Every day | ORAL | 6 refills | Status: DC

## 2019-04-26 NOTE — Progress Notes (Signed)
PATIENT: Lee Martinez DOB: 04/02/1936   REASON FOR VISIT: routine follow up for Parkinson's Disease HISTORY FROM: patient  Chief Complaint  Patient presents with  . Parkinson's disease    rm 6one yr FU, "some days I take an extra dose of carb/levo which is very helpful for my tremors; no new symptoms"     HISTORY OF PRESENT ILLNESS:  UPDATE (04/26/19, VRP): Since last visit, doing about the same. Symptoms are stable. Severity is moderate. No alleviating or aggravating factors. Tolerating carb/levo (2 tabs 4x per day).    UPDATE (04/20/18, VRP): Since last visit, doing about the same. Symptoms are stable. Severity is moderate. No alleviating or aggravating factors. Tolerating carb/levo. Did not take rasagiline due to cost.    UPDATE (06/16/17, VRP): Since last visit, doing well. Tolerating carb/levo and rasagiline. No alleviating or aggravating factors. No issues with balance, swallowing, smell or taste.   UPDATE 12/13/16: Since last visit, sxs stable. Notes tremors are worse if he misses carb/levo. No balance issues. Anxiety is stable / slightly worse. On sertraline and mirtazapine per Dr. Casimiro Needle.  UPDATE 12/14/15: Since last visit, symptoms are stable. No change in balance, gait, swallow, depression or anxiety. Tolerating meds.   UPDATE 12/12/14: Since last visit, symptoms are stable. No falls. No wearing off. Minimal benefit with carb/levo.   UPDATE 06/13/14: Since last visit, doing about the same. No wearing off. Anixety is still his main problem, but feels better than before.   UPDATE 02/10/14: Since last visit, tremor and anxiety are persistent. Some wearing off before doses. Incomplete tremor control with each dose. No peak dyskinesias.   UPDATE 04/26/13 (LL): Since last visit, patient met with psychiatrist and his Remeron was increased.  He reports that his mood and anxiety is better.  He is sleeping better with aid from [place].  Denies problems with Sinemet wearing off,  constipation, urinary frequency, or balance problems.   UPDATE 01/19/13: Since last visit, patient did not followup. He's not sure what happened. Patient's main complaint today is anxiety and sleep problems. He has been taking mirtazapine for several months without relief. In the past he did not express interest in SSRI or other medication. Now he is more amenable to these medications. He's never seen psychiatry.  Regarding tremor, stiffness and slowness of muscles, he feels the symptoms are stable. Carbidopa/levodopa seems to help a little bit. His wife has not noticed much difference. Patient did not go up on carbidopa/levodopa dosing last time.   UPDATE 12/04/11: Doing about the same. Not much benefit on carb/levo, although has only been on 1 tab TID x 1.5 weeks. No side effects. Asking about acupuncture. Having some anxiety and sleep problems. Not interested in SSRI or other meds now.   PRIOR HPI: 83 year old right-handed male with history of gastric ulcer, here for evaluation of tremor. Patient reports tremor in the left upper extremity starting 3 years ago. Now tremor affecting his right upper extremity and legs. Denies voice or chin tremor. Tremor mainly affects him when he has stressed or tired. He describes a primarily resting tremor which improves with action. Patient denies dizziness but has mild uneasiness with walking. No change in sense of smell or taste. No reports of acting out dreams or REM behavior disorder symptoms. He has developed mild anxiety over the past one year. No constipation. Several weeks ago patient saw a homeopathic doctor who evaluated this tremor problem and diagnosed him with a "ganglion problem" likely due to "  parasites". He was not prescribed any medications.    REVIEW OF SYSTEMS: Full 14 system review of systems performed and negative except: as per HPI.  ALLERGIES: No Known Allergies  HOME MEDICATIONS: Outpatient Medications Prior to Visit  Medication Sig Dispense  Refill  . calcium gluconate 500 MG tablet Take 1 tablet by mouth daily.    . carbidopa-levodopa (SINEMET IR) 25-100 MG tablet Take 2 tablets by mouth 4 (four) times daily. 720 tablet 4  . cholecalciferol (VITAMIN D) 1000 UNITS tablet Take 1,000 Units by mouth daily.    . fish oil-omega-3 fatty acids 1000 MG capsule Take 2 g by mouth daily.    Marland Kitchen LORazepam (ATIVAN) 0.5 MG tablet TAKE 1 TABLET BY MOUTH ONE TIME A DAY 30 tablet 5  . magnesium 30 MG tablet Take 30 mg by mouth daily.    . mirtazapine (REMERON) 45 MG tablet TAKE 1 TABLET BY MOUTH AT  BEDTIME 90 tablet 3  . Multiple Vitamin (MULTIVITAMIN) tablet Take 1 tablet by mouth daily.    . saw palmetto 160 MG capsule Take 160 mg by mouth 2 (two) times daily.    Marland Kitchen UNABLE TO FIND Med Name: [name], natural herb     No facility-administered medications prior to visit.    PAST MEDICAL HISTORY: Past Medical History:  Diagnosis Date  . Parkinson's disease ([place]) 07/2011   followed by [facility] Neurology    PAST SURGICAL HISTORY: Past Surgical History:  Procedure Laterality Date  . basil removal    . HERNIA REPAIR    . THYROID SURGERY      FAMILY HISTORY: Family History  Problem Relation Age of Onset  . Heart failure Mother   . Breast cancer Sister     SOCIAL HISTORY: Social History   Socioeconomic History  . Marital status: Married    Spouse name: [name]  . Number of children: 0  . Years of education: 37  . Highest education level: Not on file  Occupational History  . Occupation: retired    Fish farm manager: RETIRED  Tobacco Use  . Smoking status: Never Smoker  . Smokeless tobacco: Never Used  Substance and Sexual Activity  . Alcohol use: Yes    Alcohol/week: 1.0 standard drinks    Types: 1 Shots of liquor per week  . Drug use: No  . Sexual activity: Not Currently    Partners: Female  Other Topics Concern  . Not on file  Social History Narrative   Lives with wife   Social Determinants of Health   Financial Resource  Strain: Low Risk   . Difficulty of Paying Living Expenses: Not hard at all  Food Insecurity: No Food Insecurity  . Worried About Charity fundraiser in the Last Year: Never true  . Ran Out of Food in the Last Year: Never true  Transportation Needs: No Transportation Needs  . Lack of Transportation (Medical): No  . Lack of Transportation (Non-Medical): No  Physical Activity: Inactive  . Days of Exercise per Week: 0 days  . Minutes of Exercise per Session: 0 min  Stress: No Stress Concern Present  . Feeling of Stress : Not at all  Social Connections:   . Frequency of Communication with Friends and Family: Not on file  . Frequency of Social Gatherings with Friends and Family: Not on file  . Attends Religious Services: Not on file  . Active Member of Clubs or Organizations: Not on file  . Attends Archivist Meetings: Not on file  .  Marital Status: Not on file  Intimate Partner Violence: Not At Risk  . Fear of Current or Ex-Partner: No  . Emotionally Abused: No  . Physically Abused: No  . Sexually Abused: No     PHYSICAL EXAM  Vitals:   04/26/19 1302  BP: 128/72  Pulse: 84  Temp: (!) 97.1 F (36.2 C)  Weight: 159 lb 6.4 oz (72.3 kg)  Height: 5' 11.75" (1.822 m)   Body mass index is 21.77 kg/m.  Generalized: Patient is awake, alert and in no acute distress. Well developed and groomed.  Neck: Supple, no carotid bruits  Cardiac: Regular rate rhythm, no murmur   Neurological examination  Mental Status: Awake, alert. Language is fluent and comprehension intact. POSITIVE MYERSONS. MASKED FACIES.  Cranial Nerves: Pupils are equal and reactive to light. Visual fields are full to confrontation. Conjugate eye movements are full and symmetric. Facial sensation and strength are symmetric. Hearing is intact. Palate elevated symmetrically and uvula is midline. Shoulder shrug is symmetric. Tongue is midline.  Motor: RESTING TREMOR (BUE > BLE; LEFT WORSE THAN RIGHT). Normal bulk  and MILD COGWHEELING IN LUE > RUE. BRADYKINESIA IN LUE AND BLE. MOUTH TREMOR WITH RAM. Full strength in the upper and lower extremities. No pronator drift.  Sensory: Intact and symmetric to light touch, temperature, vibration.  Coordination: No ataxia or dysmetria on finger-nose or rapid alternating movement testing.  Reflexes: Deep tendon reflexes in the upper and lower extremity are present and symmetric. Gait and Station: LUE TREMOR WITH WALKING. DECR LEFT ARM SWING. LEFT ARM TREMOR WITH WALKING.    DIAGNOSTIC DATA (LABS, IMAGING, TESTING)   11/04/11 MRI brain  1. Mild periventricular gliosis. Few punctate non-specific subcortical foci of gliosis. May represent chronic small vessel ischemic disease.  2. No acute findings.    ASSESSMENT AND PLAN  83 y.o. male with progressive resting tremor left greater than right upper extremities, bilateral lower extremities, with cognitive rigidity, bradykinesia, stooped posture and Myerson sign. Mild response to carb/levo. Depression/anxiety stable. Rx'd rasagiline in past, but was too expensive.    Dx: idiopathic Parkinson's disease.   Parkinson's disease ([place])  Mood disorder in conditions classified elsewhere   PLAN:   PARKINSONS DISEASE - continue carbidopa-levodopa 2 tabs 4 times per day - add rasagiline 0.17m daily (will see if coverage is better now) - caution with balance, walking and driving - continue PT exercises - reviewed DBS options; patient not interested at this time  DEPRESSION / ANXIETY - continue mirtazipine for depression/anxiety (per PCP and Dr. PCasimiro Needle  Meds ordered this encounter  Medications  . rasagiline (AZILECT) 0.5 MG TABS tablet    Sig: Take 1 tablet (0.5 mg total) by mouth daily.    Dispense:  30 tablet    Refill:  6  . carbidopa-levodopa (SINEMET IR) 25-100 MG tablet    Sig: Take 2 tablets by mouth 4 (four) times daily.    Dispense:  720 tablet    Refill:  4   Return in about 1 year (around  04/25/2020).     VPenni Bombard MD 139/67/2897 19:15PM Certified in Neurology, Neurophysiology and Neuroimaging  [facility] [address] [place] [place] [phone]

## 2019-05-03 NOTE — Progress Notes (Signed)
What did he say about the cardiologist?

## 2019-05-08 ENCOUNTER — Other Ambulatory Visit: Payer: Self-pay | Admitting: Nurse Practitioner

## 2019-05-08 DIAGNOSIS — E782 Mixed hyperlipidemia: Secondary | ICD-10-CM

## 2019-09-02 ENCOUNTER — Other Ambulatory Visit: Payer: Self-pay | Admitting: Nurse Practitioner

## 2019-10-05 ENCOUNTER — Ambulatory Visit: Payer: Medicare Other | Admitting: Nurse Practitioner

## 2019-10-12 ENCOUNTER — Telehealth: Payer: Self-pay

## 2019-10-12 ENCOUNTER — Other Ambulatory Visit: Payer: Self-pay | Admitting: Nurse Practitioner

## 2019-10-12 ENCOUNTER — Telehealth: Payer: Self-pay | Admitting: Diagnostic Neuroimaging

## 2019-10-12 DIAGNOSIS — F418 Other specified anxiety disorders: Secondary | ICD-10-CM

## 2019-10-12 NOTE — Telephone Encounter (Signed)
Patient called requesting a refill on lorazepam I have called him to schedule him an appt. I left him a v/m to give [place] a call back. YL,RMA

## 2019-10-12 NOTE — Telephone Encounter (Signed)
Pt has called for a refill on his LORazepam (ATIVAN) 0.5 MG tablet to [place]

## 2019-10-12 NOTE — Telephone Encounter (Signed)
Called patient and informed him that his PCP refills lorazepam; advised he call them. Patient verbalized understanding, appreciation.

## 2019-10-13 ENCOUNTER — Ambulatory Visit (INDEPENDENT_AMBULATORY_CARE_PROVIDER_SITE_OTHER): Payer: Medicare Other | Admitting: Nurse Practitioner

## 2019-10-13 ENCOUNTER — Other Ambulatory Visit: Payer: Self-pay

## 2019-10-13 ENCOUNTER — Encounter: Payer: Self-pay | Admitting: Nurse Practitioner

## 2019-10-13 VITALS — BP 112/74 | HR 89 | Temp 97.9°F | Ht 71.75 in | Wt 153.2 lb

## 2019-10-13 DIAGNOSIS — F418 Other specified anxiety disorders: Secondary | ICD-10-CM

## 2019-10-13 DIAGNOSIS — E782 Mixed hyperlipidemia: Secondary | ICD-10-CM

## 2019-10-13 DIAGNOSIS — G20 Parkinson's disease: Secondary | ICD-10-CM | POA: Diagnosis not present

## 2019-10-13 MED ORDER — LORAZEPAM 0.5 MG PO TABS: ORAL_TABLET | ORAL | 0 refills | Status: DC

## 2019-10-13 NOTE — Progress Notes (Signed)
Subjective:     Patient ID: Lee Martinez , male    DOB: Jul 03, 1935 , 84 y.o.   MRN: [id number]   Chief Complaint  Patient presents with  . Medication Management    Lorazepam    HPI  Lee Martinez present today for a medication refill for his lorazepam. He has taken this medication in the past. He is reporting that his parkinson's is better now with the lorazepam.  He last saw the neurologist in December. He had the COVID shot and did well with the vaccination.      Men's preventive visit. Patient Health Questionnaire (PHQ-2) is    Clinical Support from 04/06/2019 in [facility]  PHQ-2 Total Score 0     Patient is on a Regular diet. Marital status: Married. Relevant history for alcohol use is:  Social History   Substance and Sexual Activity  Alcohol Use Yes  . Alcohol/week: 1.0 standard drink  . Types: 1 Shots of liquor per week   Relevant history for tobacco use is:  Social History   Tobacco Use  Smoking Status Never Smoker  Smokeless Tobacco Never Used  . Past Medical History:  Diagnosis Date  . Parkinson's disease ([place]) 07/2011   followed by [facility] Neurology     Family History  Problem Relation Age of Onset  . Heart failure Mother   . Breast cancer Sister     Current Outpatient Medications:  .  calcium gluconate 500 MG tablet, Take 1 tablet by mouth daily., Disp: , Rfl:  .  carbidopa-levodopa (SINEMET IR) 25-100 MG tablet, Take 2 tablets by mouth 4 (four) times daily., Disp: 720 tablet, Rfl: 4 .  cholecalciferol (VITAMIN D) 1000 UNITS tablet, Take 1,000 Units by mouth daily., Disp: , Rfl:  .  fish oil-omega-3 fatty acids 1000 MG capsule, Take 2 g by mouth daily., Disp: , Rfl:  .  LORazepam (ATIVAN) 0.5 MG tablet, TAKE 1 TABLET BY MOUTH ONE TIME A DAY, Disp: 30 tablet, Rfl: 0 .  magnesium 30 MG tablet, Take 30 mg by mouth daily., Disp: , Rfl:  .  mirtazapine (REMERON) 45 MG tablet, TAKE 1 TABLET BY MOUTH AT  BEDTIME, Disp: 90 tablet, Rfl:  3 .  Multiple Vitamin (MULTIVITAMIN) tablet, Take 1 tablet by mouth daily., Disp: , Rfl:  .  saw palmetto 160 MG capsule, Take 160 mg by mouth 2 (two) times daily., Disp: , Rfl:  .  sertraline (ZOLOFT) 50 MG tablet, TAKE 1 TABLET BY MOUTH  DAILY, Disp: 90 tablet, Rfl: 3 .  UNABLE TO FIND, Med Name: Gaba, natural herb, Disp: , Rfl:  .  rasagiline (AZILECT) 0.5 MG TABS tablet, Take 1 tablet (0.5 mg total) by mouth daily., Disp: 30 tablet, Rfl: 6   No Known Allergies   Review of Systems  Constitutional: Negative.   HENT: Negative.   Eyes: Negative.   Respiratory: Negative.   Cardiovascular: Negative.   Gastrointestinal: Negative.   Endocrine: Negative.   Genitourinary: Negative.   Musculoskeletal: Negative.   Skin: Negative.   Allergic/Immunologic: Negative.   Neurological: Positive for tremors (History or Parkinsons, he admits to them being worse today).  Hematological: Negative.   Psychiatric/Behavioral: Negative.      Today's Vitals   10/13/19 1612  BP: 112/74  Pulse: 89  Temp: 97.9 F (36.6 C)  TempSrc: Oral  Weight: 153 lb 3.2 oz (69.5 kg)  Height: 5' 11.75" (1.822 m)   Body mass index is 20.92 kg/m.   Objective:  Physical Exam Vitals reviewed.  Constitutional:      Appearance: Normal appearance. He is obese.  HENT:     Head: Normocephalic and atraumatic.  Eyes:     Extraocular Movements: Extraocular movements intact.     Conjunctiva/sclera: Conjunctivae normal.     Pupils: Pupils are equal, round, and reactive to light.  Cardiovascular:     Rate and Rhythm: Normal rate and regular rhythm.     Pulses: Normal pulses.     Heart sounds: Normal heart sounds. No murmur.  Pulmonary:     Effort: Pulmonary effort is normal. No respiratory distress.     Breath sounds: Normal breath sounds.  Abdominal:     General: Abdomen is flat. Bowel sounds are normal. There is no distension.     Palpations: Abdomen is soft.  Genitourinary:    Prostate: Normal.     Rectum:  Guaiac result negative.  Musculoskeletal:        General: Normal range of motion.     Cervical back: Normal range of motion and neck supple.  Skin:    General: Skin is warm.     Capillary Refill: Capillary refill takes less than 2 seconds.     Comments: He has dry scaly skin to his lower extremities and bilateral arms have multiple hyperpigmented ares.    Neurological:     General: No focal deficit present.     Mental Status: He is alert and oriented to person, place, and time.     Comments: He has significant bilateral hand tremors, worse today than I have seen him in the past  Psychiatric:        Mood and Affect: Mood normal.        Behavior: Behavior normal.        Thought Content: Thought content normal.        Judgment: Judgment normal.         Assessment And Plan:     1. Depression with anxiety  Chronic, stable  Continue medications as directed, reviewed PDMP  Refill for his lorazepam sent - CMP14+EGFR - LORazepam (ATIVAN) 0.5 MG tablet; TAKE 1 TABLET BY MOUTH ONE TIME A DAY  Dispense: 30 tablet; Refill: 5  2. Mixed hyperlipidemia  Chronic, he is not on any medications he has refused multiple times.   I have discussed the risks of uncontrolled hyperlipidemia.  - Lipid panel  3. Parkinson's disease ([place])  Chronic, being followed by Neurology  [name], FNP    THE PATIENT IS ENCOURAGED TO PRACTICE SOCIAL DISTANCING DUE TO THE COVID-19 PANDEMIC.

## 2019-10-14 LAB — LIPID PANEL
Chol/HDL Ratio: 6.8 ratio — ABNORMAL HIGH (ref 0.0–5.0)
Cholesterol, Total: 204 mg/dL — ABNORMAL HIGH (ref 100–199)
HDL: 30 mg/dL — ABNORMAL LOW (ref >39)
LDL Chol Calc (NIH): 140 mg/dL — ABNORMAL HIGH (ref 0–99)
Triglycerides: 186 mg/dL — ABNORMAL HIGH (ref 0–149)
VLDL Cholesterol Cal: 34 mg/dL (ref 5–40)

## 2019-10-25 ENCOUNTER — Telehealth: Payer: Self-pay

## 2019-10-25 NOTE — Telephone Encounter (Signed)
The pt was given his most recent lab results. 

## 2019-11-09 ENCOUNTER — Other Ambulatory Visit: Payer: Self-pay | Admitting: Nurse Practitioner

## 2019-11-09 DIAGNOSIS — F418 Other specified anxiety disorders: Secondary | ICD-10-CM

## 2019-11-09 NOTE — Telephone Encounter (Signed)
Ativan

## 2019-12-08 ENCOUNTER — Other Ambulatory Visit: Payer: Self-pay | Admitting: Nurse Practitioner

## 2019-12-08 DIAGNOSIS — F418 Other specified anxiety disorders: Secondary | ICD-10-CM

## 2020-02-07 DIAGNOSIS — H04123 Dry eye syndrome of bilateral lacrimal glands: Secondary | ICD-10-CM | POA: Diagnosis not present

## 2020-02-07 DIAGNOSIS — H31092 Other chorioretinal scars, left eye: Secondary | ICD-10-CM | POA: Diagnosis not present

## 2020-02-07 DIAGNOSIS — H2513 Age-related nuclear cataract, bilateral: Secondary | ICD-10-CM | POA: Diagnosis not present

## 2020-02-20 ENCOUNTER — Encounter: Payer: Self-pay | Admitting: Nurse Practitioner

## 2020-02-28 DIAGNOSIS — H2513 Age-related nuclear cataract, bilateral: Secondary | ICD-10-CM | POA: Diagnosis not present

## 2020-03-29 DIAGNOSIS — H2513 Age-related nuclear cataract, bilateral: Secondary | ICD-10-CM | POA: Diagnosis not present

## 2020-03-29 DIAGNOSIS — H04123 Dry eye syndrome of bilateral lacrimal glands: Secondary | ICD-10-CM | POA: Diagnosis not present

## 2020-04-08 ENCOUNTER — Other Ambulatory Visit: Payer: Self-pay | Admitting: Nurse Practitioner

## 2020-04-08 DIAGNOSIS — F418 Other specified anxiety disorders: Secondary | ICD-10-CM

## 2020-04-10 NOTE — Telephone Encounter (Signed)
Loreazepam refill

## 2020-04-12 ENCOUNTER — Ambulatory Visit (INDEPENDENT_AMBULATORY_CARE_PROVIDER_SITE_OTHER): Payer: Medicare Other

## 2020-04-12 ENCOUNTER — Encounter: Payer: Self-pay | Admitting: Nurse Practitioner

## 2020-04-12 ENCOUNTER — Ambulatory Visit (INDEPENDENT_AMBULATORY_CARE_PROVIDER_SITE_OTHER): Payer: Medicare Other | Admitting: Nurse Practitioner

## 2020-04-12 ENCOUNTER — Other Ambulatory Visit: Payer: Self-pay

## 2020-04-12 ENCOUNTER — Encounter: Payer: Medicare Other | Admitting: Nurse Practitioner

## 2020-04-12 VITALS — BP 110/58 | HR 89 | Temp 98.2°F | Ht 71.7 in | Wt 148.0 lb

## 2020-04-12 DIAGNOSIS — G20 Parkinson's disease: Secondary | ICD-10-CM

## 2020-04-12 DIAGNOSIS — Z0000 Encounter for general adult medical examination without abnormal findings: Secondary | ICD-10-CM | POA: Diagnosis not present

## 2020-04-12 DIAGNOSIS — E889 Metabolic disorder, unspecified: Secondary | ICD-10-CM | POA: Diagnosis not present

## 2020-04-12 DIAGNOSIS — E782 Mixed hyperlipidemia: Secondary | ICD-10-CM | POA: Diagnosis not present

## 2020-04-12 NOTE — Progress Notes (Signed)
I,Tianna Badgett,acting as a Education administrator for [employer], NP.,have documented all relevant documentation on the behalf of [employer], NP,as directed by  Bary Castilla, NP while in the presence of Bary Castilla, NP.  This visit occurred during the SARS-CoV-2 public health emergency.  Safety protocols were in place, including screening questions prior to the visit, additional usage of staff PPE, and extensive cleaning of exam room while observing appropriate contact time as indicated for disinfecting solutions.  Subjective:     Patient ID: Lee Martinez , male    DOB: 14-Jun-1935 , 84 y.o.   MRN: [id number]   Chief Complaint  Patient presents with   Annual Exam    HPI  Here for physical exam. He is followed by Dr. Leta Baptist for his Parkinson's. He does not have any concerns at this time. He reports compliance with medications.    Tremors are controllable. Diet: he eats a lot of fruits and a variety of other things such as meat and chicken soup.  Exercise: He is walking. He drives.   He will also get an annual wellness visit today with the nurse.     Past Medical History:  Diagnosis Date   Parkinson's disease ([place]) 07/2011   followed by [facility] Neurology     Family History  Problem Relation Age of Onset   Heart failure Mother    Breast cancer Sister      Current Outpatient Medications:    calcium gluconate 500 MG tablet, Take 1 tablet by mouth daily., Disp: , Rfl:    carbidopa-levodopa (SINEMET IR) 25-100 MG tablet, Take 2 tablets by mouth 4 (four) times daily., Disp: 720 tablet, Rfl: 4   cholecalciferol (VITAMIN D) 1000 UNITS tablet, Take 1,000 Units by mouth daily., Disp: , Rfl:    fish oil-omega-3 fatty acids 1000 MG capsule, Take 2 g by mouth daily., Disp: , Rfl:    LORazepam (ATIVAN) 0.5 MG tablet, TAKE 1 TABLET BY MOUTH  DAILY, Disp: 30 tablet, Rfl: 1   magnesium 30 MG tablet, Take 30 mg by mouth daily., Disp: , Rfl:    mirtazapine (REMERON) 45  MG tablet, TAKE 1 TABLET BY MOUTH AT  BEDTIME, Disp: 90 tablet, Rfl: 3   Multiple Vitamin (MULTIVITAMIN) tablet, Take 1 tablet by mouth daily., Disp: , Rfl:    saw palmetto 160 MG capsule, Take 160 mg by mouth 2 (two) times daily., Disp: , Rfl:    sertraline (ZOLOFT) 50 MG tablet, TAKE 1 TABLET BY MOUTH  DAILY, Disp: 90 tablet, Rfl: 3   UNABLE TO FIND, Med Name: Gaba, natural herb, Disp: , Rfl:    rasagiline (AZILECT) 0.5 MG TABS tablet, Take 1 tablet (0.5 mg total) by mouth daily., Disp: 30 tablet, Rfl: 6   No Known Allergies   Men's preventive visit. Patient Health Questionnaire (PHQ-2) is    Office Visit from 04/12/2020 in [facility]  PHQ-2 Total Score 0    . Patient is not on a  diet. Marital status: Married. Relevant history for alcohol use is:  Social History   Substance and Sexual Activity  Alcohol Use Not Currently   Alcohol/week: 1.0 standard drink   Types: 1 Shots of liquor per week  . Relevant history for tobacco use is:  Social History   Tobacco Use  Smoking Status Never Smoker  Smokeless Tobacco Never Used  .   Review of Systems  Constitutional: Negative.  Negative for appetite change and fever.  HENT: Negative.  Negative for sinus  pressure and sore throat.   Eyes: Negative.   Respiratory: Negative.  Negative for cough and shortness of breath.   Cardiovascular: Negative.  Negative for chest pain and palpitations.  Gastrointestinal: Negative.   Endocrine: Negative.   Genitourinary: Negative.   Musculoskeletal: Negative.   Skin: Negative.   Allergic/Immunologic: Negative.   Neurological: Positive for tremors.       Has a hx of Parkinson's.  Followed by neurology.   Hematological: Negative.   Psychiatric/Behavioral: Negative.      Today's Vitals   04/12/20 1412  BP: (!) 110/58  Pulse: 89  Temp: 98.2 F (36.8 C)  TempSrc: Oral  Weight: 148 lb (67.1 kg)  Height: 5' 11.7" (1.821 m)  PainSc: 0-No pain   Body mass index is  20.24 kg/m.  Wt Readings from Last 3 Encounters:  04/12/20 148 lb (67.1 kg)  04/12/20 148 lb (67.1 kg)  10/13/19 153 lb 3.2 oz (69.5 kg)   Objective:  Physical Exam Constitutional:      Appearance: Normal appearance.  HENT:     Head: Normocephalic.     Right Ear: Tympanic membrane normal.     Left Ear: Tympanic membrane normal.     Nose:     Comments: Deferred. Patient masked.     Mouth/Throat:     Comments: Deferred. Patient masked.  Eyes:     Extraocular Movements: Extraocular movements intact.     Pupils: Pupils are equal, round, and reactive to light.  Cardiovascular:     Rate and Rhythm: Normal rate and regular rhythm.     Pulses: Normal pulses.     Heart sounds: Normal heart sounds.  Pulmonary:     Effort: Pulmonary effort is normal.     Breath sounds: Normal breath sounds. No wheezing or rales.  Abdominal:     General: Bowel sounds are normal.  Genitourinary:    Comments: Deferred. PSA ordered.  Musculoskeletal:        General: Normal range of motion.     Cervical back: Normal range of motion.  Skin:    General: Skin is warm and dry.     Capillary Refill: Capillary refill takes less than 2 seconds.     Comments: Dry, scaly skin.   Neurological:     Mental Status: He is alert and oriented to person, place, and time.     Motor: Tremor present.     Comments: Hx. Of Parkinson's dx. Bilateral hand tremors. See's neurology.   Psychiatric:        Mood and Affect: Mood normal.        Behavior: Behavior normal.         Assessment And Plan:    1. Encounter for general adult medical examination w/o abnormal findings - CBC - Hemoglobin A1c - CMP14+EGFR - Lipid panel - PSA -Behavior modification discussed and diet history reviewed.  -Pt will continue to walk for exercise.  -Recommended the importance of daily multivitamins, vitamin D and calcium  -Recommended importance of immunization and screening.   2. Depression with anxiety  -Chronic, stable  -Continue  taking medication  -Is currently taking Lorazepam (ativan) 0.5 mg. Take one tablet by mouth once daily.   3. Mixed hyperlipidemia - Will check Lipid panel -Chronic  -Has declined taking medication in the past. -Educated about eating a healthy diet. Increase intake of fiber, fish. Decease fatty and fried foods.   3. Parkinson's disease ([place]) -Chronic, bilateral hand tremors  -Followed by Dr. Leta Baptist and with his meds.  For Parkinsons   Patient was given opportunity to ask questions. Patient verbalized understanding of the plan and was able to repeat key elements of the plan. All questions were answered to their satisfaction.   Bary Castilla, NP   I, Bary Castilla, NP, have reviewed all documentation for this visit. The documentation on 04/12/20 for the exam, diagnosis, procedures, and orders are all accurate and complete.  THE PATIENT IS ENCOURAGED TO PRACTICE SOCIAL DISTANCING DUE TO THE COVID-19 PANDEMIC.

## 2020-04-12 NOTE — Patient Instructions (Signed)
[name] , Thank you for taking time to come for your Medicare Wellness Visit. I appreciate your ongoing commitment to your health goals. Please review the following plan we discussed and let me know if I can assist you in the future.   Screening recommendations/referrals: Colonoscopy: not required Recommended yearly ophthalmology/optometry visit for glaucoma screening and checkup Recommended yearly dental visit for hygiene and checkup  Vaccinations: Influenza vaccine: decline Pneumococcal vaccine: decline Tdap vaccine: completed 09/27/2010, due 09/26/2020 Shingles vaccine: decline   Covid-19:  08/20/2019  Advanced directives: Please bring a copy of your POA (Power of Attorney) and/or Living Will to your next appointment.   Conditions/risks identified: none  Next appointment: Follow up in one year for your annual wellness visit.   Preventive Care 56 Years and Older, Male Preventive care refers to lifestyle choices and visits with your health care provider that can promote health and wellness. What does preventive care include?  A yearly physical exam. This is also called an annual well check.  Dental exams once or twice a year.  Routine eye exams. Ask your health care provider how often you should have your eyes checked.  Personal lifestyle choices, including:  Daily care of your teeth and gums.  Regular physical activity.  Eating a healthy diet.  Avoiding tobacco and drug use.  Limiting alcohol use.  Practicing safe sex.  Taking low doses of aspirin every day.  Taking vitamin and mineral supplements as recommended by your health care provider. What happens during an annual well check? The services and screenings done by your health care provider during your annual well check will depend on your age, overall health, lifestyle risk factors, and family history of disease. Counseling  Your health care provider may ask you questions about your:  Alcohol use.  Tobacco  use.  Drug use.  Emotional well-being.  Home and relationship well-being.  Sexual activity.  Eating habits.  History of falls.  Memory and ability to understand (cognition).  Work and work Statistician. Screening  You may have the following tests or measurements:  Height, weight, and BMI.  Blood pressure.  Lipid and cholesterol levels. These may be checked every 5 years, or more frequently if you are over 61 years old.  Skin check.  Lung cancer screening. You may have this screening every year starting at age 67 if you have a 30-pack-year history of smoking and currently smoke or have quit within the past 15 years.  Fecal occult blood test (FOBT) of the stool. You may have this test every year starting at age 62.  Flexible sigmoidoscopy or colonoscopy. You may have a sigmoidoscopy every 5 years or a colonoscopy every 10 years starting at age 63.  Prostate cancer screening. Recommendations will vary depending on your family history and other risks.  Hepatitis C blood test.  Hepatitis B blood test.  Sexually transmitted disease (STD) testing.  Diabetes screening. This is done by checking your blood sugar (glucose) after you have not eaten for a while (fasting). You may have this done every 1-3 years.  Abdominal aortic aneurysm (AAA) screening. You may need this if you are a current or former smoker.  Osteoporosis. You may be screened starting at age 45 if you are at high risk. Talk with your health care provider about your test results, treatment options, and if necessary, the need for more tests. Vaccines  Your health care provider may recommend certain vaccines, such as:  Influenza vaccine. This is recommended every year.  Tetanus,  diphtheria, and acellular pertussis (Tdap, Td) vaccine. You may need a Td booster every 10 years.  Zoster vaccine. You may need this after age 37.  Pneumococcal 13-valent conjugate (PCV13) vaccine. One dose is recommended after age  61.  Pneumococcal polysaccharide (PPSV23) vaccine. One dose is recommended after age 79. Talk to your health care provider about which screenings and vaccines you need and how often you need them. This information is not intended to replace advice given to you by your health care provider. Make sure you discuss any questions you have with your health care provider. Document Released: 05/26/2015 Document Revised: 01/17/2016 Document Reviewed: 02/28/2015 Elsevier Interactive Patient Education  2017 [place] Prevention in the Home Falls can cause injuries. They can happen to people of all ages. There are many things you can do to make your home safe and to help prevent falls. What can I do on the outside of my home?  Regularly fix the edges of walkways and driveways and fix any cracks.  Remove anything that might make you trip as you walk through a door, such as a raised step or threshold.  Trim any bushes or trees on the path to your home.  Use bright outdoor lighting.  Clear any walking paths of anything that might make someone trip, such as rocks or tools.  Regularly check to see if handrails are loose or broken. Make sure that both sides of any steps have handrails.  Any raised decks and porches should have guardrails on the edges.  Have any leaves, snow, or ice cleared regularly.  Use sand or salt on walking paths during winter.  Clean up any spills in your garage right away. This includes oil or grease spills. What can I do in the bathroom?  Use night lights.  Install grab bars by the toilet and in the tub and shower. Do not use towel bars as grab bars.  Use non-skid mats or decals in the tub or shower.  If you need to sit down in the shower, use a plastic, non-slip stool.  Keep the floor dry. Clean up any water that spills on the floor as soon as it happens.  Remove soap buildup in the tub or shower regularly.  Attach bath mats securely with double-sided  non-slip rug tape.  Do not have throw rugs and other things on the floor that can make you trip. What can I do in the bedroom?  Use night lights.  Make sure that you have a light by your bed that is easy to reach.  Do not use any sheets or blankets that are too big for your bed. They should not hang down onto the floor.  Have a firm chair that has side arms. You can use this for support while you get dressed.  Do not have throw rugs and other things on the floor that can make you trip. What can I do in the kitchen?  Clean up any spills right away.  Avoid walking on wet floors.  Keep items that you use a lot in easy-to-reach places.  If you need to reach something above you, use a strong step stool that has a grab bar.  Keep electrical cords out of the way.  Do not use floor polish or wax that makes floors slippery. If you must use wax, use non-skid floor wax.  Do not have throw rugs and other things on the floor that can make you trip. What can I do with  my stairs?  Do not leave any items on the stairs.  Make sure that there are handrails on both sides of the stairs and use them. Fix handrails that are broken or loose. Make sure that handrails are as long as the stairways.  Check any carpeting to make sure that it is firmly attached to the stairs. Fix any carpet that is loose or worn.  Avoid having throw rugs at the top or bottom of the stairs. If you do have throw rugs, attach them to the floor with carpet tape.  Make sure that you have a light switch at the top of the stairs and the bottom of the stairs. If you do not have them, ask someone to add them for you. What else can I do to help prevent falls?  Wear shoes that:  Do not have high heels.  Have rubber bottoms.  Are comfortable and fit you well.  Are closed at the toe. Do not wear sandals.  If you use a stepladder:  Make sure that it is fully opened. Do not climb a closed stepladder.  Make sure that both  sides of the stepladder are locked into place.  Ask someone to hold it for you, if possible.  Clearly mark and make sure that you can see:  Any grab bars or handrails.  First and last steps.  Where the edge of each step is.  Use tools that help you move around (mobility aids) if they are needed. These include:  Canes.  Walkers.  Scooters.  Crutches.  Turn on the lights when you go into a dark area. Replace any light bulbs as soon as they burn out.  Set up your furniture so you have a clear path. Avoid moving your furniture around.  If any of your floors are uneven, fix them.  If there are any pets around you, be aware of where they are.  Review your medicines with your doctor. Some medicines can make you feel dizzy. This can increase your chance of falling. Ask your doctor what other things that you can do to help prevent falls. This information is not intended to replace advice given to you by your health care provider. Make sure you discuss any questions you have with your health care provider. Document Released: 02/23/2009 Document Revised: 10/05/2015 Document Reviewed: 06/03/2014 Elsevier Interactive Patient Education  2017 [employer].

## 2020-04-12 NOTE — Progress Notes (Signed)
This visit occurred during the SARS-CoV-2 public health emergency.  Safety protocols were in place, including screening questions prior to the visit, additional usage of staff PPE, and extensive cleaning of exam room while observing appropriate contact time as indicated for disinfecting solutions.  Subjective:   Lee Martinez is a 84 y.o. male who presents for Medicare Annual/Subsequent preventive examination.  Review of Systems     Cardiac Risk Factors include: advanced age (>70men, >77 women);male gender;sedentary lifestyle     Objective:    Today's Vitals   04/12/20 1458  BP: (!) 110/58  Pulse: 89  Temp: 98.2 F (36.8 C)  TempSrc: Oral  Weight: 148 lb (67.1 kg)  Height: 5' 11.7" (1.821 m)   Body mass index is 20.24 kg/m.  Advanced Directives 04/12/2020 04/06/2019 05/21/2018 12/12/2014  Does Patient Have a Medical Advance Directive? Yes Yes Yes Yes  Type of Paramedic of [place];Living will [place];Living will Living will Living will;Healthcare Power of Attorney  Does patient want to make changes to medical advance directive? - - No - Patient declined -  Copy of [place] in Chart? Yes - validated most recent copy scanned in chart (See row information) Yes - validated most recent copy scanned in chart (See row information) - No - copy requested  Some encounter information is confidential and restricted. Go to Review Flowsheets activity to see all data.    Current Medications (verified) Outpatient Encounter Medications as of 04/12/2020  Medication Sig  . calcium gluconate 500 MG tablet Take 1 tablet by mouth daily.  . carbidopa-levodopa (SINEMET IR) 25-100 MG tablet Take 2 tablets by mouth 4 (four) times daily.  . cholecalciferol (VITAMIN D) 1000 UNITS tablet Take 1,000 Units by mouth daily.  . fish oil-omega-3 fatty acids 1000 MG capsule Take 2 g by mouth daily.  Marland Kitchen LORazepam (ATIVAN) 0.5 MG tablet TAKE 1 TABLET BY  MOUTH  DAILY  . magnesium 30 MG tablet Take 30 mg by mouth daily.  . mirtazapine (REMERON) 45 MG tablet TAKE 1 TABLET BY MOUTH AT  BEDTIME  . Multiple Vitamin (MULTIVITAMIN) tablet Take 1 tablet by mouth daily.  . rasagiline (AZILECT) 0.5 MG TABS tablet Take 1 tablet (0.5 mg total) by mouth daily.  . saw palmetto 160 MG capsule Take 160 mg by mouth 2 (two) times daily.  . sertraline (ZOLOFT) 50 MG tablet TAKE 1 TABLET BY MOUTH  DAILY  . UNABLE TO FIND Med Name: [name], natural herb   No facility-administered encounter medications on file as of 04/12/2020.    Allergies (verified) Patient has no known allergies.   History: Past Medical History:  Diagnosis Date  . Parkinson's disease ([place]) 07/2011   followed by [facility] Neurology   Past Surgical History:  Procedure Laterality Date  . basil removal    . HERNIA REPAIR    . THYROID SURGERY     Family History  Problem Relation Age of Onset  . Heart failure Mother   . Breast cancer Sister    Social History   Socioeconomic History  . Marital status: Married    Spouse name: [name]  . Number of children: 0  . Years of education: 38  . Highest education level: Not on file  Occupational History  . Occupation: retired    Fish farm manager: RETIRED  Tobacco Use  . Smoking status: Never Smoker  . Smokeless tobacco: Never Used  Vaping Use  . Vaping Use: Never used  Substance and Sexual  Activity  . Alcohol use: Not Currently    Alcohol/week: 1.0 standard drink    Types: 1 Shots of liquor per week  . Drug use: No  . Sexual activity: Not Currently    Partners: Female  Other Topics Concern  . Not on file  Social History Narrative   Lives with wife   Social Determinants of Health   Financial Resource Strain: Low Risk   . Difficulty of Paying Living Expenses: Not hard at all  Food Insecurity: No Food Insecurity  . Worried About Charity fundraiser in the Last Year: Never true  . Ran Out of Food in the Last Year: Never true    Transportation Needs: No Transportation Needs  . Lack of Transportation (Medical): No  . Lack of Transportation (Non-Medical): No  Physical Activity: Inactive  . Days of Exercise per Week: 0 days  . Minutes of Exercise per Session: 0 min  Stress: No Stress Concern Present  . Feeling of Stress : Not at all  Social Connections:   . Frequency of Communication with Friends and Family: Not on file  . Frequency of Social Gatherings with Friends and Family: Not on file  . Attends Religious Services: Not on file  . Active Member of Clubs or Organizations: Not on file  . Attends Archivist Meetings: Not on file  . Marital Status: Not on file    Tobacco Counseling Counseling given: Not Answered   Clinical Intake:  Pre-visit preparation completed: Yes  Pain : No/denies pain     Nutritional Status: BMI of 19-24  Normal Nutritional Risks: None Diabetes: No  How often do you need to have someone help you when you read instructions, pamphlets, or other written materials from your doctor or pharmacy?: 1 - Never What is the last grade level you completed in school?: 12th grade  Diabetic? no  Interpreter Needed?: No  Information entered by :: NAllen LPN   Activities of Daily Living In your present state of health, do you have any difficulty performing the following activities: 04/12/2020 04/12/2020  Hearing? N N  Vision? N N  Difficulty concentrating or making decisions? N N  Walking or climbing stairs? N N  Dressing or bathing? N N  Doing errands, shopping? N N  Preparing Food and eating ? N -  Using the Toilet? N -  In the past six months, have you accidently leaked urine? N -  Do you have problems with loss of bowel control? N -  Managing your Medications? N -  Managing your Finances? N -  Housekeeping or managing your Housekeeping? N -  Some recent data might be hidden    Patient Care Team: [name], FNP as PCP - General (General Practice)  Indicate  any recent Medical Services you may have received from other than [facility] providers in the past year (date may be approximate).     Assessment:   This is a routine wellness examination for Lee Martinez.  Hearing/Vision screen  Hearing Screening   125Hz  250Hz  500Hz  1000Hz  2000Hz  3000Hz  4000Hz  6000Hz  8000Hz   Right ear:           Left ear:           Vision Screening Comments: Regular eye exams, Dr. Katy Fitch  Dietary issues and exercise activities discussed: Current Exercise Habits: The patient does not participate in regular exercise at present  Goals    . Patient Stated     04/06/2019, no goals    . Patient  Stated     04/12/2020, no goals      Depression Screen PHQ 2/9 Scores 04/12/2020 04/06/2019 07/23/2018 05/27/2016 06/27/2015  PHQ - 2 Score 0 0 0 0 0  PHQ- 9 Score 0 0 2 - -  Exception Documentation Other- indicate reason in comment box - - - -  Not completed just completed by CMA - - - -    Fall Risk Fall Risk  04/12/2020 04/26/2019 04/06/2019 06/16/2017 12/13/2016  Falls in the past year? 0 0 0 No No  Number falls in past yr: - - - - -  Injury with Fall? - - - - -  Risk for fall due to : Medication side effect - Medication side effect - -  Follow up Falls evaluation completed;Education provided;Falls prevention discussed - Falls evaluation completed;Education provided;Falls prevention discussed - -    Any stairs in or around the home? Yes  If so, are there any without handrails? No  Home free of loose throw rugs in walkways, pet beds, electrical cords, etc? Yes  Adequate lighting in your home to reduce risk of falls? Yes   ASSISTIVE DEVICES UTILIZED TO PREVENT FALLS:  Life alert? No  Use of a cane, walker or w/c? No  Grab bars in the bathroom? No  Shower chair or bench in shower? Yes  Elevated toilet seat or a handicapped toilet? No   TIMED UP AND GO:  Was the test performed? No .   Slow and steady without assistive device  Cognitive Function:     6CIT Screen 04/12/2020  04/06/2019  What Year? 0 points 0 points  What month? 0 points 0 points  What time? 0 points 0 points  Count back from 20 2 points 0 points  Months in reverse 2 points 2 points  Repeat phrase 6 points 6 points  Total Score 10 8    Immunizations Immunization History  Administered Date(s) Administered  . Janssen (J&J) SARS-COV-2 Vaccination 08/20/2019    TDAP status: Up to date Flu Vaccine status: Declined, Education has been provided regarding the importance of this vaccine but patient still declined. Advised may receive this vaccine at local pharmacy or Health Dept. Aware to provide a copy of the vaccination record if obtained from local pharmacy or Health Dept. Verbalized acceptance and understanding. Pneumococcal vaccine status: Declined,  Education has been provided regarding the importance of this vaccine but patient still declined. Advised may receive this vaccine at local pharmacy or Health Dept. Aware to provide a copy of the vaccination record if obtained from local pharmacy or Health Dept. Verbalized acceptance and understanding.  Covid-19 vaccine status: Completed vaccines  Qualifies for Shingles Vaccine? Yes   Zostavax completed No   Shingrix Completed?: No.    Education has been provided regarding the importance of this vaccine. Patient has been advised to call insurance company to determine out of pocket expense if they have not yet received this vaccine. Advised may also receive vaccine at local pharmacy or Health Dept. Verbalized acceptance and understanding.  Screening Tests Health Maintenance  Topic Date Due  . INFLUENZA VACCINE  08/10/2020 (Originally 12/12/2019)  . PNA vac Low Risk Adult (1 of 2 - PCV13) 04/12/2021 (Originally 03/20/2003)  . TETANUS/TDAP  09/26/2020  . COVID-19 Vaccine  Completed    Health Maintenance  There are no preventive care reminders to display for this patient.  Colorectal cancer screening: No longer required.   Lung Cancer Screening:  (Low Dose CT Chest recommended if Age 14-80 years, 90  pack-year currently smoking OR have quit w/in 15years.) does not qualify.   Lung Cancer Screening Referral: no  Additional Screening:  Hepatitis C Screening: does not qualify;   Vision Screening: Recommended annual ophthalmology exams for early detection of glaucoma and other disorders of the eye. Is the patient up to date with their annual eye exam?  Yes  Who is the provider or what is the name of the office in which the patient attends annual eye exams? Dr. Katy Fitch If pt is not established with a provider, would they like to be referred to a provider to establish care? No .   Dental Screening: Recommended annual dental exams for proper oral hygiene  Community Resource Referral / Chronic Care Management: CRR required this visit?  No   CCM required this visit?  No      Plan:     I have personally reviewed and noted the following in the patient's chart:   . Medical and social history . Use of alcohol, tobacco or illicit drugs  . Current medications and supplements . Functional ability and status . Nutritional status . Physical activity . Advanced directives . List of other physicians . Hospitalizations, surgeries, and ER visits in previous 12 months . Vitals . Screenings to include cognitive, depression, and falls . Referrals and appointments  In addition, I have reviewed and discussed with patient certain preventive protocols, quality metrics, and best practice recommendations. A written personalized care plan for preventive services as well as general preventive health recommendations were provided to patient.     Kellie Simmering, LPN   [id number]   Nurse Notes:

## 2020-04-12 NOTE — Patient Instructions (Signed)

## 2020-04-13 LAB — LIPID PANEL
Chol/HDL Ratio: 6.1 ratio — ABNORMAL HIGH (ref 0.0–5.0)
Cholesterol, Total: 225 mg/dL — ABNORMAL HIGH (ref 100–199)
HDL: 37 mg/dL — ABNORMAL LOW (ref >39)
LDL Chol Calc (NIH): 159 mg/dL — ABNORMAL HIGH (ref 0–99)
Triglycerides: 160 mg/dL — ABNORMAL HIGH (ref 0–149)
VLDL Cholesterol Cal: 29 mg/dL (ref 5–40)

## 2020-04-13 LAB — CMP14+EGFR
ALT: 6 IU/L (ref 0–44)
AST: 17 IU/L (ref 0–40)
Albumin/Globulin Ratio: 1.7 (ref 1.2–2.2)
Albumin: 3.9 g/dL (ref 3.6–4.6)
Alkaline Phosphatase: 62 IU/L (ref 44–121)
BUN/Creatinine Ratio: 20 (ref 10–24)
BUN: 20 mg/dL (ref 8–27)
Bilirubin Total: 0.3 mg/dL (ref 0.0–1.2)
CO2: 24 mmol/L (ref 20–29)
Calcium: 9.5 mg/dL (ref 8.6–10.2)
Chloride: 102 mmol/L (ref 96–106)
Creatinine, Ser: 1.01 mg/dL (ref 0.76–1.27)
GFR calc Af Amer: 80 mL/min/{1.73_m2} (ref >59)
GFR calc non Af Amer: 69 mL/min/{1.73_m2} (ref >59)
Globulin, Total: 2.3 g/dL (ref 1.5–4.5)
Glucose: 102 mg/dL — ABNORMAL HIGH (ref 65–99)
Potassium: 4.4 mmol/L (ref 3.5–5.2)
Sodium: 140 mmol/L (ref 134–144)
Total Protein: 6.2 g/dL (ref 6.0–8.5)

## 2020-04-13 LAB — CBC
Hematocrit: 39.2 % (ref 37.5–51.0)
Hemoglobin: 13.4 g/dL (ref 13.0–17.7)
MCH: 30.9 pg (ref 26.6–33.0)
MCHC: 34.2 g/dL (ref 31.5–35.7)
MCV: 90 fL (ref 79–97)
Platelets: 215 10*3/uL (ref 150–450)
RBC: 4.34 x10E6/uL (ref 4.14–5.80)
RDW: 12.5 % (ref 11.6–15.4)
WBC: 7.5 10*3/uL (ref 3.4–10.8)

## 2020-04-13 LAB — HEMOGLOBIN A1C
Est. average glucose Bld gHb Est-mCnc: 105 mg/dL
Hgb A1c MFr Bld: 5.3 % (ref 4.8–5.6)

## 2020-04-13 LAB — PSA: Prostate Specific Ag, Serum: 12.2 ng/mL — ABNORMAL HIGH (ref 0.0–4.0)

## 2020-04-13 NOTE — Progress Notes (Signed)
Good afternoon Lee Martinez. Your blood work came back. Your total cholesterol, triglycerides, LDL is elevated. Your TC is 225 last year was 204. Triglycerides are 160 last year was 186. Your HDL (good cholesterol) is 37 and your LDL (bad cholesterol) is 159 up from 140 last year. If you are interested in medication please let me know and we can start you on something because in the past you have declined taking anything. Also if you are interested in seeing a cardiologist let me know they may suggest more options for you.  Your PSA (prostate antigen) is also elevated. I would like to refer you to urologist and we can also check it again in 6 months. Please let me know if you want a referral to a urologist.

## 2020-04-14 ENCOUNTER — Telehealth: Payer: Self-pay

## 2020-04-14 NOTE — Telephone Encounter (Signed)
-----   Message from Bary Castilla, NP sent at 04/13/2020  2:58 PM EST ----- Good afternoon Lee Martinez. Your blood work came back. Your total cholesterol, triglycerides, LDL is elevated. Your TC is 225 last year was 204. Triglycerides are 160 last year was 186. Your HDL (good cholesterol) is 37 and your LDL (bad cholesterol) is 159 up from 140 last year. If you are interested in medication please let me know and we can start you on something because in the past you have declined taking anything. Also if you are interested in seeing a cardiologist let me know they may suggest more options for you.  Your PSA (prostate antigen) is also elevated. I would like to refer you to urologist and we can also check it again in 6 months. Please let me know if you want a referral to a urologist.

## 2020-04-14 NOTE — Telephone Encounter (Signed)
Left the patient a message to call back for lab results. 

## 2020-04-19 NOTE — Progress Notes (Signed)
Thank you. Can we make an appt for him for a 6 months follow up.

## 2020-04-21 ENCOUNTER — Ambulatory Visit (INDEPENDENT_AMBULATORY_CARE_PROVIDER_SITE_OTHER): Payer: Medicare Other

## 2020-04-21 ENCOUNTER — Telehealth: Payer: Self-pay

## 2020-04-21 DIAGNOSIS — Z23 Encounter for immunization: Secondary | ICD-10-CM | POA: Diagnosis not present

## 2020-04-21 NOTE — Progress Notes (Signed)
   Covid-19 Vaccination Clinic  Name:  Lee Martinez    MRN: [id number] DOB: December 12, 1935  04/21/2020  [name] was observed post Covid-19 immunization for 15 minutes without incident. He was provided with Vaccine Information Sheet and instruction to access the V-Safe system.   [name] was instructed to call 911 with any severe reactions post vaccine: Marland Kitchen Difficulty breathing  . Swelling of face and throat  . A fast heartbeat  . A bad rash all over body  . Dizziness and weakness   Immunizations Administered    No immunizations on file.

## 2020-04-21 NOTE — Telephone Encounter (Signed)
I left the pt a message to call back to schedule an appt for a 6 mths f/u.

## 2020-04-21 NOTE — Telephone Encounter (Signed)
-----   Message from Bary Castilla, NP sent at 04/19/2020  8:27 AM EST ----- Thank you. Can we make an appt for him for a 6 months follow up.

## 2020-04-26 ENCOUNTER — Telehealth: Payer: Self-pay | Admitting: *Deleted

## 2020-04-26 ENCOUNTER — Ambulatory Visit (INDEPENDENT_AMBULATORY_CARE_PROVIDER_SITE_OTHER): Payer: Medicare Other | Admitting: Diagnostic Neuroimaging

## 2020-04-26 ENCOUNTER — Encounter: Payer: Self-pay | Admitting: Diagnostic Neuroimaging

## 2020-04-26 ENCOUNTER — Other Ambulatory Visit: Payer: Self-pay

## 2020-04-26 VITALS — BP 111/64 | HR 90 | Ht 71.75 in | Wt 149.0 lb

## 2020-04-26 DIAGNOSIS — G20 Parkinson's disease: Secondary | ICD-10-CM

## 2020-04-26 MED ORDER — CARBIDOPA-LEVODOPA 25-100 MG PO TABS
2.0000 | ORAL_TABLET | Freq: Every day | ORAL | 4 refills | Status: DC
Start: 2020-04-26 — End: 2020-09-25

## 2020-04-26 NOTE — Progress Notes (Signed)
PATIENT: Lee Martinez DOB: 1935-05-23   REASON FOR VISIT: routine follow up for Parkinson's Disease HISTORY FROM: patient  Chief Complaint  Patient presents with  . Parkinson's Disease    Rm 6, one year FU "taking two more carb/levo a day as needed x 6-8 months because tremors are worsening; no other concerns"     HISTORY OF PRESENT ILLNESS:  UPDATE (04/26/20, VRP): Since last visit, PD is slightly progressing. Now taking carb/levo 25/100 5x per day. No alleviating or aggravating factors. Tolerating meds.    UPDATE (04/26/19, VRP): Since last visit, doing about the same. Symptoms are stable. Severity is moderate. No alleviating or aggravating factors. Tolerating carb/levo (2 tabs 4x per day).    UPDATE (04/20/18, VRP): Since last visit, doing about the same. Symptoms are stable. Severity is moderate. No alleviating or aggravating factors. Tolerating carb/levo. Did not take rasagiline due to cost.    UPDATE (06/16/17, VRP): Since last visit, doing well. Tolerating carb/levo and rasagiline. No alleviating or aggravating factors. No issues with balance, swallowing, smell or taste.   UPDATE 12/13/16: Since last visit, sxs stable. Notes tremors are worse if he misses carb/levo. No balance issues. Anxiety is stable / slightly worse. On sertraline and mirtazapine per Dr. Casimiro Needle.  UPDATE 12/14/15: Since last visit, symptoms are stable. No change in balance, gait, swallow, depression or anxiety. Tolerating meds.   UPDATE 12/12/14: Since last visit, symptoms are stable. No falls. No wearing off. Minimal benefit with carb/levo.   UPDATE 06/13/14: Since last visit, doing about the same. No wearing off. Anixety is still his main problem, but feels better than before.   UPDATE 02/10/14: Since last visit, tremor and anxiety are persistent. Some wearing off before doses. Incomplete tremor control with each dose. No peak dyskinesias.   UPDATE 04/26/13 (LL): Since last visit, patient met with psychiatrist  and his Remeron was increased.  He reports that his mood and anxiety is better.  He is sleeping better with aid from [place].  Denies problems with Sinemet wearing off, constipation, urinary frequency, or balance problems.   UPDATE 01/19/13: Since last visit, patient did not followup. He's not sure what happened. Patient's main complaint today is anxiety and sleep problems. He has been taking mirtazapine for several months without relief. In the past he did not express interest in SSRI or other medication. Now he is more amenable to these medications. He's never seen psychiatry.  Regarding tremor, stiffness and slowness of muscles, he feels the symptoms are stable. Carbidopa/levodopa seems to help a little bit. His wife has not noticed much difference. Patient did not go up on carbidopa/levodopa dosing last time.   UPDATE 12/04/11: Doing about the same. Not much benefit on carb/levo, although has only been on 1 tab TID x 1.5 weeks. No side effects. Asking about acupuncture. Having some anxiety and sleep problems. Not interested in SSRI or other meds now.   PRIOR HPI: 84 year old right-handed male with history of gastric ulcer, here for evaluation of tremor. Patient reports tremor in the left upper extremity starting 3 years ago. Now tremor affecting his right upper extremity and legs. Denies voice or chin tremor. Tremor mainly affects him when he has stressed or tired. He describes a primarily resting tremor which improves with action. Patient denies dizziness but has mild uneasiness with walking. No change in sense of smell or taste. No reports of acting out dreams or REM behavior disorder symptoms. He has developed mild anxiety over the past  one year. No constipation. Several weeks ago patient saw a homeopathic doctor who evaluated this tremor problem and diagnosed him with a "ganglion problem" likely due to "parasites". He was not prescribed any medications.    REVIEW OF SYSTEMS: Full 14 system  review of systems performed and negative except: as per HPI.  ALLERGIES: No Known Allergies  HOME MEDICATIONS: Outpatient Medications Prior to Visit  Medication Sig Dispense Refill  . calcium gluconate 500 MG tablet Take 1 tablet by mouth daily.    . carbidopa-levodopa (SINEMET IR) 25-100 MG tablet Take 2 tablets by mouth 4 (four) times daily. 720 tablet 4  . cholecalciferol (VITAMIN D) 1000 UNITS tablet Take 1,000 Units by mouth daily.    . fish oil-omega-3 fatty acids 1000 MG capsule Take 2 g by mouth daily.    Marland Kitchen LORazepam (ATIVAN) 0.5 MG tablet TAKE 1 TABLET BY MOUTH  DAILY 30 tablet 1  . magnesium 30 MG tablet Take 30 mg by mouth daily.    . mirtazapine (REMERON) 45 MG tablet TAKE 1 TABLET BY MOUTH AT  BEDTIME 90 tablet 3  . Multiple Vitamin (MULTIVITAMIN) tablet Take 1 tablet by mouth daily.    . rasagiline (AZILECT) 0.5 MG TABS tablet Take 1 tablet (0.5 mg total) by mouth daily. 30 tablet 6  . saw palmetto 160 MG capsule Take 160 mg by mouth 2 (two) times daily.    . sertraline (ZOLOFT) 50 MG tablet TAKE 1 TABLET BY MOUTH  DAILY 90 tablet 3  . UNABLE TO FIND Med Name: [name], natural herb     No facility-administered medications prior to visit.    PAST MEDICAL HISTORY: Past Medical History:  Diagnosis Date  . Parkinson's disease ([place]) 07/2011   followed by [facility]    PAST SURGICAL HISTORY: Past Surgical History:  Procedure Laterality Date  . basil removal    . HERNIA REPAIR    . THYROID SURGERY      FAMILY HISTORY: Family History  Problem Relation Age of Onset  . Heart failure Mother   . Breast cancer Sister     SOCIAL HISTORY: Social History   Socioeconomic History  . Marital status: Widowed    Spouse name: [name]  . Number of children: 0  . Years of education: 23  . Highest education level: Not on file  Occupational History  . Occupation: retired    Fish farm manager: RETIRED  Tobacco Use  . Smoking status: Never Smoker  . Smokeless tobacco: Never  Used  Vaping Use  . Vaping Use: Never used  Substance and Sexual Activity  . Alcohol use: Not Currently    Alcohol/week: 1.0 standard drink    Types: 1 Shots of liquor per week  . Drug use: No  . Sexual activity: Not Currently    Partners: Female  Other Topics Concern  . Not on file  Social History Narrative   04/26/20 Lives alone, wife passed this year   Social Determinants of Health   Financial Resource Strain: Low Risk   . Difficulty of Paying Living Expenses: Not hard at all  Food Insecurity: No Food Insecurity  . Worried About Charity fundraiser in the Last Year: Never true  . Ran Out of Food in the Last Year: Never true  Transportation Needs: No Transportation Needs  . Lack of Transportation (Medical): No  . Lack of Transportation (Non-Medical): No  Physical Activity: Inactive  . Days of Exercise per Week: 0 days  . Minutes of Exercise per Session:  0 min  Stress: No Stress Concern Present  . Feeling of Stress : Not at all  Social Connections: Not on file  Intimate Partner Violence: Not on file     PHYSICAL EXAM  Vitals:   04/26/20 1501  BP: 111/64  Pulse: 90  Weight: 149 lb (67.6 kg)  Height: 5' 11.75" (1.822 m)   Body mass index is 20.35 kg/m.  Generalized: Patient is awake, alert and in no acute distress. Well developed and groomed.  Neck: Supple, no carotid bruits  Cardiac: Regular rate rhythm, no murmur   Neurological examination  Mental Status: Awake, alert. Language is fluent and comprehension intact. POSITIVE MYERSONS. MASKED FACIES.  Cranial Nerves: Pupils are equal and reactive to light. Visual fields are full to confrontation. Conjugate eye movements are full and symmetric. Facial sensation and strength are symmetric. Hearing is intact. Palate elevated symmetrically and uvula is midline. Shoulder shrug is symmetric. Tongue is midline.  Motor: RESTING TREMOR (BUE > BLE; LEFT WORSE THAN RIGHT). Normal bulk and MILD COGWHEELING IN LUE > RUE.  MODERATE BRADYKINESIA IN LUE AND BLE. MOUTH TREMOR WITH RAM. Full strength in the upper and lower extremities. No pronator drift.  Sensory: Intact and symmetric to light touch, temperature, vibration.  Coordination: No ataxia or dysmetria on finger-nose or rapid alternating movement testing.  Reflexes: Deep tendon reflexes in the upper and lower extremity are present and symmetric. Gait and Station: LUE TREMOR WITH WALKING. DECR LEFT ARM SWING. LEFT ARM TREMOR WITH WALKING.    DIAGNOSTIC DATA (LABS, IMAGING, TESTING)   11/04/11 MRI brain  1. Mild periventricular gliosis. Few punctate non-specific subcortical foci of gliosis. May represent chronic small vessel ischemic disease.  2. No acute findings.    ASSESSMENT AND PLAN  84 y.o. male with progressive resting tremor left greater than right upper extremities, bilateral lower extremities, with cognitive rigidity, bradykinesia, stooped posture and Myerson sign. Mild response to carb/levo. Depression/anxiety stable. Rx'd rasagiline in past, but was too expensive.    Dx: idiopathic Parkinson's disease.   Parkinson's disease ([place])   PLAN:   PARKINSONS DISEASE - CHANGE (carbidopa-levodopa 25/100 --> 2 tabs 5 times per day)  - caution with balance, walking and driving - continue PT exercises - reviewed DBS options; patient not interested at this time  DEPRESSION / ANXIETY - continue mirtazipine for depression/anxiety (per PCP and Dr. Casimiro Needle)  Meds ordered this encounter  Medications  . carbidopa-levodopa (SINEMET IR) 25-100 MG tablet    Sig: Take 2 tablets by mouth 5 (five) times daily.    Dispense:  900 tablet    Refill:  4   Return in about 15 months (around 07/25/2021).     Penni Bombard, MD 95/18/8416, 6:06 PM Certified in Neurology, Neurophysiology and Neuroimaging  [facility] [address], [place], [place] [address] [phone]

## 2020-04-26 NOTE — Telephone Encounter (Signed)
Patient was no show for follow up today. 

## 2020-06-07 ENCOUNTER — Telehealth: Payer: Self-pay | Admitting: Nurse Practitioner

## 2020-06-07 NOTE — Telephone Encounter (Signed)
Left message asking pt to call office regarding his appointment 10/26/20 with Dr Bary Castilla @ 2.  I wanted to see if patient could come @ 3  If so please r/s and put AWV @ 3:30.

## 2020-06-08 ENCOUNTER — Other Ambulatory Visit: Payer: Self-pay | Admitting: Nurse Practitioner

## 2020-06-12 ENCOUNTER — Other Ambulatory Visit: Payer: Self-pay | Admitting: Nurse Practitioner

## 2020-06-12 DIAGNOSIS — F418 Other specified anxiety disorders: Secondary | ICD-10-CM

## 2020-06-12 DIAGNOSIS — H31092 Other chorioretinal scars, left eye: Secondary | ICD-10-CM | POA: Diagnosis not present

## 2020-06-12 DIAGNOSIS — H04123 Dry eye syndrome of bilateral lacrimal glands: Secondary | ICD-10-CM | POA: Diagnosis not present

## 2020-06-12 DIAGNOSIS — H2513 Age-related nuclear cataract, bilateral: Secondary | ICD-10-CM | POA: Diagnosis not present

## 2020-06-12 NOTE — Telephone Encounter (Signed)
Lorazepam refill

## 2020-07-23 ENCOUNTER — Other Ambulatory Visit: Payer: Self-pay | Admitting: Nurse Practitioner

## 2020-09-20 ENCOUNTER — Other Ambulatory Visit: Payer: Self-pay | Admitting: Nurse Practitioner

## 2020-09-20 DIAGNOSIS — F418 Other specified anxiety disorders: Secondary | ICD-10-CM

## 2020-09-20 NOTE — Telephone Encounter (Signed)
Lorazepam ° °

## 2020-09-25 ENCOUNTER — Ambulatory Visit: Payer: Medicare Other | Admitting: Diagnostic Neuroimaging

## 2020-09-25 ENCOUNTER — Encounter: Payer: Self-pay | Admitting: Diagnostic Neuroimaging

## 2020-09-25 VITALS — BP 109/70 | HR 88 | Ht 71.75 in | Wt 154.0 lb

## 2020-09-25 DIAGNOSIS — G20 Parkinson's disease: Secondary | ICD-10-CM | POA: Diagnosis not present

## 2020-09-25 MED ORDER — CARBIDOPA-LEVODOPA 25-100 MG PO TABS
2.0000 | ORAL_TABLET | Freq: Every day | ORAL | 4 refills | Status: DC
Start: 2020-09-25 — End: 2021-06-04

## 2020-09-25 NOTE — Progress Notes (Signed)
PATIENT: Lee Martinez DOB: 07-14-1935   REASON FOR VISIT: routine follow up for Parkinson's Disease HISTORY FROM: patient  Chief Complaint  Patient presents with  . Parkinson's disease    Rm 6, 6 month FU  "no new concerns;  my wife passed last year"       HISTORY OF PRESENT ILLNESS:  UPDATE (09/25/20, VRP): Since last visit, doing well. Symptoms are moderate. Severity is mild. No alleviating or aggravating factors. Tolerating meds.    UPDATE (04/26/20, VRP): Since last visit, PD is slightly progressing. Now taking carb/levo 25/100 5x per day. No alleviating or aggravating factors. Tolerating meds.    UPDATE (04/26/19, VRP): Since last visit, doing about the same. Symptoms are stable. Severity is moderate. No alleviating or aggravating factors. Tolerating carb/levo (2 tabs 4x per day).    UPDATE (04/20/18, VRP): Since last visit, doing about the same. Symptoms are stable. Severity is moderate. No alleviating or aggravating factors. Tolerating carb/levo. Did not take rasagiline due to cost.    UPDATE (06/16/17, VRP): Since last visit, doing well. Tolerating carb/levo and rasagiline. No alleviating or aggravating factors. No issues with balance, swallowing, smell or taste.   UPDATE 12/13/16: Since last visit, sxs stable. Notes tremors are worse if he misses carb/levo. No balance issues. Anxiety is stable / slightly worse. On sertraline and mirtazapine per Dr. Casimiro Needle.  UPDATE 12/14/15: Since last visit, symptoms are stable. No change in balance, gait, swallow, depression or anxiety. Tolerating meds.   UPDATE 12/12/14: Since last visit, symptoms are stable. No falls. No wearing off. Minimal benefit with carb/levo.   UPDATE 06/13/14: Since last visit, doing about the same. No wearing off. Anixety is still his main problem, but feels better than before.   UPDATE 02/10/14: Since last visit, tremor and anxiety are persistent. Some wearing off before doses. Incomplete tremor control with each dose.  No peak dyskinesias.   UPDATE 04/26/13 (LL): Since last visit, patient met with psychiatrist and his Remeron was increased.  He reports that his mood and anxiety is better.  He is sleeping better with aid from [place].  Denies problems with Sinemet wearing off, constipation, urinary frequency, or balance problems.   UPDATE 01/19/13: Since last visit, patient did not followup. He's not sure what happened. Patient's main complaint today is anxiety and sleep problems. He has been taking mirtazapine for several months without relief. In the past he did not express interest in SSRI or other medication. Now he is more amenable to these medications. He's never seen psychiatry.  Regarding tremor, stiffness and slowness of muscles, he feels the symptoms are stable. Carbidopa/levodopa seems to help a little bit. His wife has not noticed much difference. Patient did not go up on carbidopa/levodopa dosing last time.   UPDATE 12/04/11: Doing about the same. Not much benefit on carb/levo, although has only been on 1 tab TID x 1.5 weeks. No side effects. Asking about acupuncture. Having some anxiety and sleep problems. Not interested in SSRI or other meds now.   PRIOR HPI: 85 year old right-handed male with history of gastric ulcer, here for evaluation of tremor. Patient reports tremor in the left upper extremity starting 3 years ago. Now tremor affecting his right upper extremity and legs. Denies voice or chin tremor. Tremor mainly affects him when he has stressed or tired. He describes a primarily resting tremor which improves with action. Patient denies dizziness but has mild uneasiness with walking. No change in sense of smell or taste. No  reports of acting out dreams or REM behavior disorder symptoms. He has developed mild anxiety over the past one year. No constipation. Several weeks ago patient saw a homeopathic doctor who evaluated this tremor problem and diagnosed him with a "ganglion problem" likely due to  "parasites". He was not prescribed any medications.    REVIEW OF SYSTEMS: Full 14 system review of systems performed and negative except: as per HPI.  ALLERGIES: No Known Allergies  HOME MEDICATIONS: Outpatient Medications Prior to Visit  Medication Sig Dispense Refill  . calcium gluconate 500 MG tablet Take 1 tablet by mouth daily.    . cholecalciferol (VITAMIN D) 1000 UNITS tablet Take 1,000 Units by mouth daily.    . fish oil-omega-3 fatty acids 1000 MG capsule Take 2 g by mouth daily.    Marland Kitchen LORazepam (ATIVAN) 0.5 MG tablet TAKE 1 TABLET BY MOUTH  DAILY 30 tablet 2  . magnesium 30 MG tablet Take 30 mg by mouth daily.    . mirtazapine (REMERON) 45 MG tablet TAKE 1 TABLET BY MOUTH AT  BEDTIME 90 tablet 3  . Multiple Vitamin (MULTIVITAMIN) tablet Take 1 tablet by mouth daily.    . saw palmetto 160 MG capsule Take 160 mg by mouth 2 (two) times daily.    Marland Kitchen UNABLE TO FIND Med Name: [name], natural herb    . carbidopa-levodopa (SINEMET IR) 25-100 MG tablet Take 2 tablets by mouth 5 (five) times daily. 900 tablet 4  . rasagiline (AZILECT) 0.5 MG TABS tablet Take 1 tablet (0.5 mg total) by mouth daily. 30 tablet 6  . sertraline (ZOLOFT) 50 MG tablet TAKE 1 TABLET BY MOUTH  DAILY 90 tablet 3   No facility-administered medications prior to visit.    PAST MEDICAL HISTORY: Past Medical History:  Diagnosis Date  . Parkinson's disease ([place]) 07/2011   followed by [facility] Neurology    PAST SURGICAL HISTORY: Past Surgical History:  Procedure Laterality Date  . basil removal    . HERNIA REPAIR    . THYROID SURGERY      FAMILY HISTORY: Family History  Problem Relation Age of Onset  . Heart failure Mother   . Breast cancer Sister     SOCIAL HISTORY: Social History   Socioeconomic History  . Marital status: Widowed    Spouse name: [name]  . Number of children: 0  . Years of education: 28  . Highest education level: Not on file  Occupational History  . Occupation: retired     Fish farm manager: RETIRED  Tobacco Use  . Smoking status: Never Smoker  . Smokeless tobacco: Never Used  Vaping Use  . Vaping Use: Never used  Substance and Sexual Activity  . Alcohol use: Not Currently    Alcohol/week: 1.0 standard drink    Types: 1 Shots of liquor per week  . Drug use: No  . Sexual activity: Not Currently    Partners: Female  Other Topics Concern  . Not on file  Social History Narrative   04/26/20 Lives alone, wife passed this year   Social Determinants of Health   Financial Resource Strain: Low Risk   . Difficulty of Paying Living Expenses: Not hard at all  Food Insecurity: No Food Insecurity  . Worried About Charity fundraiser in the Last Year: Never true  . Ran Out of Food in the Last Year: Never true  Transportation Needs: No Transportation Needs  . Lack of Transportation (Medical): No  . Lack of Transportation (Non-Medical): No  Physical  Activity: Inactive  . Days of Exercise per Week: 0 days  . Minutes of Exercise per Session: 0 min  Stress: No Stress Concern Present  . Feeling of Stress : Not at all  Social Connections: Not on file  Intimate Partner Violence: Not on file     PHYSICAL EXAM  Vitals:   09/25/20 1257  BP: 109/70  Pulse: 88  Weight: 154 lb (69.9 kg)  Height: 5' 11.75" (1.822 m)   Body mass index is 21.03 kg/m.  Generalized: Patient is awake, alert and in no acute distress. Well developed and groomed.  Neck: Supple, no carotid bruits  Cardiac: Regular rate rhythm, no murmur   Neurological examination  Mental Status: Awake, alert. Language is fluent and comprehension intact. POSITIVE MYERSONS. MASKED FACIES.  Cranial Nerves: Pupils are equal and reactive to light. Visual fields are full to confrontation. Conjugate eye movements are full and symmetric. Facial sensation and strength are symmetric. Hearing is intact. Palate elevated symmetrically and uvula is midline. Shoulder shrug is symmetric. Tongue is midline.  Motor: RARE  RESTING TREMOR (BUE > BLE; LEFT WORSE THAN RIGHT). Normal bulk and MILD COGWHEELING IN LUE > RUE. MODERATE BRADYKINESIA IN LUE AND BLE. MOUTH TREMOR WITH RAM. Full strength in the upper and lower extremities. No pronator drift.  Sensory: Intact and symmetric to light touch, temperature, vibration.  Coordination: No ataxia or dysmetria on finger-nose or rapid alternating movement testing.  Reflexes: Deep tendon reflexes in the upper and lower extremity are present and symmetric. Gait and Station: LUE TREMOR WITH WALKING. DECR LEFT ARM SWING. LEFT ARM TREMOR WITH WALKING.    DIAGNOSTIC DATA (LABS, IMAGING, TESTING)   11/04/11 MRI brain  1. Mild periventricular gliosis. Few punctate non-specific subcortical foci of gliosis. May represent chronic small vessel ischemic disease.  2. No acute findings.    ASSESSMENT AND PLAN  85 y.o. male with progressive resting tremor left greater than right upper extremities, bilateral lower extremities, with cognitive rigidity, bradykinesia, stooped posture and Myerson sign. Mild response to carb/levo. Depression/anxiety stable. Rx'd rasagiline in past, but was too expensive.    Dx: idiopathic Parkinson's disease.   Parkinson's disease ([place])   PLAN:   PARKINSONS DISEASE - continue carbidopa-levodopa 25/100 --> 2 tabs 5 times per day - caution with balance, walking and driving - continue PT exercises - reviewed DBS options; patient not interested at this time  DEPRESSION / ANXIETY - continue mirtazipine for depression/anxiety (per PCP and Dr. Casimiro Needle)  Meds ordered this encounter  Medications  . carbidopa-levodopa (SINEMET IR) 25-100 MG tablet    Sig: Take 2 tablets by mouth 5 (five) times daily.    Dispense:  900 tablet    Refill:  4   Return in about 1 year (around 09/25/2021).     Penni Bombard, MD 4/62/8638, 1:77 PM Certified in Neurology, Neurophysiology and Neuroimaging  [facility] [address], [place],  [address] [phone]

## 2020-10-26 ENCOUNTER — Encounter: Payer: Self-pay | Admitting: Nurse Practitioner

## 2020-10-26 ENCOUNTER — Other Ambulatory Visit: Payer: Self-pay

## 2020-10-26 ENCOUNTER — Ambulatory Visit (INDEPENDENT_AMBULATORY_CARE_PROVIDER_SITE_OTHER): Payer: Medicare Other | Admitting: Nurse Practitioner

## 2020-10-26 VITALS — BP 118/68 | HR 93 | Temp 98.6°F | Wt 150.6 lb

## 2020-10-26 DIAGNOSIS — G20 Parkinson's disease: Secondary | ICD-10-CM

## 2020-10-26 DIAGNOSIS — F418 Other specified anxiety disorders: Secondary | ICD-10-CM

## 2020-10-26 DIAGNOSIS — E782 Mixed hyperlipidemia: Secondary | ICD-10-CM

## 2020-10-26 DIAGNOSIS — I739 Peripheral vascular disease, unspecified: Secondary | ICD-10-CM

## 2020-10-26 NOTE — Patient Instructions (Signed)

## 2020-10-26 NOTE — Progress Notes (Signed)
[employer] as a Education administrator for [employer], NP.,have documented all relevant documentation on the behalf of [employer], NP,as directed by  Bary Castilla, NP while in the presence of Bary Castilla, NP.  This visit occurred during the SARS-CoV-2 public health emergency.  Safety protocols were in place, including screening questions prior to the visit, additional usage of staff PPE, and extensive cleaning of exam room while observing appropriate contact time as indicated for disinfecting solutions.  Subjective:     Patient ID: Lee Martinez , male    DOB: Dec 22, 1935 , 85 y.o.   MRN: [id number]   Chief Complaint  Patient presents with   Hyperlipidemia     HPI  Pt is here today fr chol follow up. He has no question or concerns at the moment, taking all meds as directed. He is currently on no meds for his cholesterol.  He does not wear dentures and he will get his eyes checked. He has no concerns today.  He sees Dr. Elissa Lovett for his Parkinson's disease.  Home NP sees him and recommended a circulation test for him for his legs. I have placed a order for it.     Past Medical History:  Diagnosis Date   Parkinson's disease ([place]) 07/2011   followed by [facility] Neurology     Family History  Problem Relation Age of Onset   Heart failure Mother    Breast cancer Sister      Current Outpatient Medications:    calcium gluconate 500 MG tablet, Take 1 tablet by mouth daily., Disp: , Rfl:    carbidopa-levodopa (SINEMET IR) 25-100 MG tablet, Take 2 tablets by mouth 5 (five) times daily., Disp: 900 tablet, Rfl: 4   cholecalciferol (VITAMIN D) 1000 UNITS tablet, Take 1,000 Units by mouth daily., Disp: , Rfl:    fish oil-omega-3 fatty acids 1000 MG capsule, Take 2 g by mouth daily., Disp: , Rfl:    LORazepam (ATIVAN) 0.5 MG tablet, TAKE 1 TABLET BY MOUTH  DAILY, Disp: 30 tablet, Rfl: 2   magnesium 30 MG tablet, Take 30 mg by mouth daily., Disp: , Rfl:    mirtazapine  (REMERON) 45 MG tablet, TAKE 1 TABLET BY MOUTH AT  BEDTIME, Disp: 90 tablet, Rfl: 3   Multiple Vitamin (MULTIVITAMIN) tablet, Take 1 tablet by mouth daily., Disp: , Rfl:    saw palmetto 160 MG capsule, Take 160 mg by mouth 2 (two) times daily., Disp: , Rfl:    UNABLE TO FIND, Med Name: Gaba, natural herb, Disp: , Rfl:    No Known Allergies   Review of Systems  Constitutional: Negative.  Negative for chills and fever.  HENT: Negative.  Negative for congestion.   Eyes: Negative.  Negative for pain and visual disturbance.  Respiratory: Negative.  Negative for cough, choking, chest tightness and wheezing.   Cardiovascular: Negative.  Negative for chest pain, palpitations and leg swelling.  Gastrointestinal: Negative.  Negative for constipation and diarrhea.  Endocrine: Negative.  Negative for polydipsia, polyphagia and polyuria.  Genitourinary: Negative.   Musculoskeletal: Negative.  Negative for arthralgias and joint swelling.  Skin: Negative.   Allergic/Immunologic: Negative.   Neurological:  Positive for tremors. Negative for dizziness and numbness.       Tremors due to hx of parkinson's dx   Hematological: Negative.   Psychiatric/Behavioral: Negative.      Today's Vitals   10/26/20 1411  BP: 118/68  Pulse: 93  Temp: 98.6 F (37 C)  SpO2: 98%  Weight: 150  lb 9.6 oz (68.3 kg)   Body mass index is 20.57 kg/m.  Wt Readings from Last 3 Encounters:  10/26/20 150 lb 9.6 oz (68.3 kg)  09/25/20 154 lb (69.9 kg)  04/26/20 149 lb (67.6 kg)    Objective:  Physical Exam Constitutional:      Appearance: Normal appearance.  HENT:     Head: Normocephalic and atraumatic.  Cardiovascular:     Rate and Rhythm: Normal rate and regular rhythm.     Pulses: Normal pulses.     Heart sounds: Normal heart sounds. No murmur heard. Musculoskeletal:        General: No swelling or tenderness.  Skin:    General: Skin is warm and dry.     Findings: No bruising.  Neurological:     Mental  Status: He is alert.     Comments: Tremors due to Parkinson's dx in both hands.         Assessment And Plan:     1. Mixed hyperlipidemia --Chronic, stable  -Continue current meds, tolerating well.  -Encouraged to continue with a low fat diet avoiding fast food and fried food.  - Lipid panel - CMP14+EGFR - CBC no Diff  2. Parkinson's disease ([place]) -Followed by Neurologist  -Continue Sinemet IR.   3. Depression with anxiety -Continue Lorazepam PRN   - CMP14+EGFR  4. Claudication ([place]) -Recommended by home call NP to check for circulation in both extremities.  - VAS [place] ABI WITH/WO TBI; Future    IF YOU HAVE BEEN REFERRED TO A SPECIALIST, IT MAY TAKE 1-2 WEEKS TO SCHEDULE/PROCESS THE REFERRAL. IF YOU HAVE NOT HEARD FROM US/SPECIALIST IN TWO WEEKS, PLEASE GIVE [place] A CALL AT [phone].   THE PATIENT IS ENCOURAGED TO PRACTICE SOCIAL DISTANCING DUE TO THE COVID-19 PANDEMIC.

## 2020-10-27 ENCOUNTER — Other Ambulatory Visit: Payer: Self-pay | Admitting: Nurse Practitioner

## 2020-10-27 DIAGNOSIS — F418 Other specified anxiety disorders: Secondary | ICD-10-CM

## 2020-10-27 LAB — CBC
Hematocrit: 42.8 % (ref 37.5–51.0)
Hemoglobin: 14.6 g/dL (ref 13.0–17.7)
MCH: 31.3 pg (ref 26.6–33.0)
MCHC: 34.1 g/dL (ref 31.5–35.7)
MCV: 92 fL (ref 79–97)
Platelets: 250 10*3/uL (ref 150–450)
RBC: 4.66 x10E6/uL (ref 4.14–5.80)
RDW: 13 % (ref 11.6–15.4)
WBC: 6.4 10*3/uL (ref 3.4–10.8)

## 2020-10-27 LAB — CMP14+EGFR
ALT: 8 IU/L (ref 0–44)
AST: 14 IU/L (ref 0–40)
Albumin/Globulin Ratio: 1.9 (ref 1.2–2.2)
Albumin: 4.3 g/dL (ref 3.6–4.6)
Alkaline Phosphatase: 62 IU/L (ref 44–121)
BUN/Creatinine Ratio: 17 (ref 10–24)
BUN: 17 mg/dL (ref 8–27)
Bilirubin Total: 0.3 mg/dL (ref 0.0–1.2)
CO2: 24 mmol/L (ref 20–29)
Calcium: 9.7 mg/dL (ref 8.6–10.2)
Chloride: 101 mmol/L (ref 96–106)
Creatinine, Ser: 1.03 mg/dL (ref 0.76–1.27)
Globulin, Total: 2.3 g/dL (ref 1.5–4.5)
Glucose: 116 mg/dL — ABNORMAL HIGH (ref 65–99)
Potassium: 4.8 mmol/L (ref 3.5–5.2)
Sodium: 140 mmol/L (ref 134–144)
Total Protein: 6.6 g/dL (ref 6.0–8.5)
eGFR: 73 mL/min/{1.73_m2} (ref >59)

## 2020-10-27 LAB — LIPID PANEL
Chol/HDL Ratio: 6.4 ratio — ABNORMAL HIGH (ref 0.0–5.0)
Cholesterol, Total: 217 mg/dL — ABNORMAL HIGH (ref 100–199)
HDL: 34 mg/dL — ABNORMAL LOW (ref >39)
LDL Chol Calc (NIH): 146 mg/dL — ABNORMAL HIGH (ref 0–99)
Triglycerides: 202 mg/dL — ABNORMAL HIGH (ref 0–149)
VLDL Cholesterol Cal: 37 mg/dL (ref 5–40)

## 2021-01-04 ENCOUNTER — Ambulatory Visit (INDEPENDENT_AMBULATORY_CARE_PROVIDER_SITE_OTHER): Payer: Medicare Other

## 2021-01-04 ENCOUNTER — Other Ambulatory Visit: Payer: Self-pay

## 2021-01-04 VITALS — BP 118/60 | HR 94 | Temp 98.6°F | Ht 68.2 in | Wt 149.4 lb

## 2021-01-04 DIAGNOSIS — Z0000 Encounter for general adult medical examination without abnormal findings: Secondary | ICD-10-CM | POA: Diagnosis not present

## 2021-01-04 NOTE — Patient Instructions (Signed)
Lee Martinez , Thank you for taking time to come for your Medicare Wellness Visit. I appreciate your ongoing commitment to your health goals. Please review the following plan we discussed and let me know if I can assist you in the future.   Screening recommendations/referrals: Colonoscopy: not required Recommended yearly ophthalmology/optometry visit for glaucoma screening and checkup Recommended yearly dental visit for hygiene and checkup  Vaccinations: Influenza vaccine: decline Pneumococcal vaccine: decline Tdap vaccine: decline Shingles vaccine: decline   Covid-19: 04/21/2020, 08/20/2019  Advanced directives: copy in chart  Conditions/risks identified: none  Next appointment: Follow up in one year for your annual wellness visit.   Preventive Care 65 Years and Older, Male Preventive care refers to lifestyle choices and visits with your health care provider that can promote health and wellness. What does preventive care include? A yearly physical exam. This is also called an annual well check. Dental exams once or twice a year. Routine eye exams. Ask your health care provider how often you should have your eyes checked. Personal lifestyle choices, including: Daily care of your teeth and gums. Regular physical activity. Eating a healthy diet. Avoiding tobacco and drug use. Limiting alcohol use. Practicing safe sex. Taking low doses of aspirin every day. Taking vitamin and mineral supplements as recommended by your health care provider. What happens during an annual well check? The services and screenings done by your health care provider during your annual well check will depend on your age, overall health, lifestyle risk factors, and family history of disease. Counseling  Your health care provider may ask you questions about your: Alcohol use. Tobacco use. Drug use. Emotional well-being. Home and relationship well-being. Sexual activity. Eating habits. History of  falls. Memory and ability to understand (cognition). Work and work environment. Screening  You may have the following tests or measurements: Height, weight, and BMI. Blood pressure. Lipid and cholesterol levels. These may be checked every 5 years, or more frequently if you are over 50 years old. Skin check. Lung cancer screening. You may have this screening every year starting at age 55 if you have a 30-pack-year history of smoking and currently smoke or have quit within the past 15 years. Fecal occult blood test (FOBT) of the stool. You may have this test every year starting at age 50. Flexible sigmoidoscopy or colonoscopy. You may have a sigmoidoscopy every 5 years or a colonoscopy every 10 years starting at age 50. Prostate cancer screening. Recommendations will vary depending on your family history and other risks. Hepatitis C blood test. Hepatitis B blood test. Sexually transmitted disease (STD) testing. Diabetes screening. This is done by checking your blood sugar (glucose) after you have not eaten for a while (fasting). You may have this done every 1-3 years. Abdominal aortic aneurysm (AAA) screening. You may need this if you are a current or former smoker. Osteoporosis. You may be screened starting at age 70 if you are at high risk. Talk with your health care provider about your test results, treatment options, and if necessary, the need for more tests. Vaccines  Your health care provider may recommend certain vaccines, such as: Influenza vaccine. This is recommended every year. Tetanus, diphtheria, and acellular pertussis (Tdap, Td) vaccine. You may need a Td booster every 10 years. Zoster vaccine. You may need this after age 60. Pneumococcal 13-valent conjugate (PCV13) vaccine. One dose is recommended after age 65. Pneumococcal polysaccharide (PPSV23) vaccine. One dose is recommended after age 65. Talk to your health care provider about which   screenings and vaccines you need and  how often you need them. This information is not intended to replace advice given to you by your health care provider. Make sure you discuss any questions you have with your health care provider. Document Released: 05/26/2015 Document Revised: 01/17/2016 Document Reviewed: 02/28/2015 Elsevier Interactive Patient Education  2017 [place] Prevention in the Home Falls can cause injuries. They can happen to people of all ages. There are many things you can do to make your home safe and to help prevent falls. What can I do on the outside of my home? Regularly fix the edges of walkways and driveways and fix any cracks. Remove anything that might make you trip as you walk through a door, such as a raised step or threshold. Trim any bushes or trees on the path to your home. Use bright outdoor lighting. Clear any walking paths of anything that might make someone trip, such as rocks or tools. Regularly check to see if handrails are loose or broken. Make sure that both sides of any steps have handrails. Any raised decks and porches should have guardrails on the edges. Have any leaves, snow, or ice cleared regularly. Use sand or salt on walking paths during winter. Clean up any spills in your garage right away. This includes oil or grease spills. What can I do in the bathroom? Use night lights. Install grab bars by the toilet and in the tub and shower. Do not use towel bars as grab bars. Use non-skid mats or decals in the tub or shower. If you need to sit down in the shower, use a plastic, non-slip stool. Keep the floor dry. Clean up any water that spills on the floor as soon as it happens. Remove soap buildup in the tub or shower regularly. Attach bath mats securely with double-sided non-slip rug tape. Do not have throw rugs and other things on the floor that can make you trip. What can I do in the bedroom? Use night lights. Make sure that you have a light by your bed that is easy to  reach. Do not use any sheets or blankets that are too big for your bed. They should not hang down onto the floor. Have a firm chair that has side arms. You can use this for support while you get dressed. Do not have throw rugs and other things on the floor that can make you trip. What can I do in the kitchen? Clean up any spills right away. Avoid walking on wet floors. Keep items that you use a lot in easy-to-reach places. If you need to reach something above you, use a strong step stool that has a grab bar. Keep electrical cords out of the way. Do not use floor polish or wax that makes floors slippery. If you must use wax, use non-skid floor wax. Do not have throw rugs and other things on the floor that can make you trip. What can I do with my stairs? Do not leave any items on the stairs. Make sure that there are handrails on both sides of the stairs and use them. Fix handrails that are broken or loose. Make sure that handrails are as long as the stairways. Check any carpeting to make sure that it is firmly attached to the stairs. Fix any carpet that is loose or worn. Avoid having throw rugs at the top or bottom of the stairs. If you do have throw rugs, attach them to the floor with carpet  tape. Make sure that you have a light switch at the top of the stairs and the bottom of the stairs. If you do not have them, ask someone to add them for you. What else can I do to help prevent falls? Wear shoes that: Do not have high heels. Have rubber bottoms. Are comfortable and fit you well. Are closed at the toe. Do not wear sandals. If you use a stepladder: Make sure that it is fully opened. Do not climb a closed stepladder. Make sure that both sides of the stepladder are locked into place. Ask someone to hold it for you, if possible. Clearly mark and make sure that you can see: Any grab bars or handrails. First and last steps. Where the edge of each step is. Use tools that help you move  around (mobility aids) if they are needed. These include: Canes. Walkers. Scooters. Crutches. Turn on the lights when you go into a dark area. Replace any light bulbs as soon as they burn out. Set up your furniture so you have a clear path. Avoid moving your furniture around. If any of your floors are uneven, fix them. If there are any pets around you, be aware of where they are. Review your medicines with your doctor. Some medicines can make you feel dizzy. This can increase your chance of falling. Ask your doctor what other things that you can do to help prevent falls. This information is not intended to replace advice given to you by your health care provider. Make sure you discuss any questions you have with your health care provider. Document Released: 02/23/2009 Document Revised: 10/05/2015 Document Reviewed: 06/03/2014 Elsevier Interactive Patient Education  2017 [employer]  

## 2021-01-04 NOTE — Progress Notes (Signed)
Subjective:   Lee Martinez is a 85 y.o. male who presents for Medicare Annual/Subsequent preventive examination.  Review of Systems      Cardiac Risk Factors include: advanced age (>35mn, >>37women);male gender;sedentary lifestyle     Objective:    Today's Vitals   01/04/21 1527  BP: 118/60  Pulse: 94  Temp: 98.6 F (37 C)  TempSrc: Oral  SpO2: 93%  Weight: 149 lb 6.4 oz (67.8 kg)  Height: 5' 8.2" (1.732 m)   Body mass index is 22.58 kg/m.  Advanced Directives 01/04/2021 04/12/2020 04/06/2019 05/21/2018 12/12/2014  Does Patient Have a Medical Advance Directive? Yes Yes Yes Yes Yes  Type of AParamedicof ANew [place] will [place] will [place] will Living will Living will;Healthcare Power of Attorney  Does patient want to make changes to medical advance directive? - - - No - Patient declined -  Copy of [place] Chart? Yes - validated most recent copy scanned in chart (See row information) Yes - validated most recent copy scanned in chart (See row information) Yes - validated most recent copy scanned in chart (See row information) - No - copy requested  Some encounter information is confidential and restricted. Go to Review Flowsheets activity to see all data.    Current Medications (verified) Outpatient Encounter Medications as of 01/04/2021  Medication Sig   calcium gluconate 500 MG tablet Take 1 tablet by mouth daily.   carbidopa-levodopa (SINEMET IR) 25-100 MG tablet Take 2 tablets by mouth 5 (five) times daily.   cholecalciferol (VITAMIN D) 1000 UNITS tablet Take 1,000 Units by mouth daily.   fish oil-omega-3 fatty acids 1000 MG capsule Take 2 g by mouth daily.   LORazepam (ATIVAN) 0.5 MG tablet TAKE 1 TABLET BY MOUTH  DAILY   magnesium 30 MG tablet Take 30 mg by mouth daily.   mirtazapine (REMERON) 45 MG tablet TAKE 1 TABLET BY MOUTH AT  BEDTIME   Multiple Vitamin  (MULTIVITAMIN) tablet Take 1 tablet by mouth daily.   saw palmetto 160 MG capsule Take 160 mg by mouth 2 (two) times daily.   UNABLE TO FIND Med Name: GIrene Pap natural herb   No facility-administered encounter medications on file as of 01/04/2021.    Allergies (verified) Patient has no known allergies.   History: Past Medical History:  Diagnosis Date   Parkinson's disease ([place] 07/2011   [facility]   Past Surgical History:  Procedure Laterality Date   basil removal     HERNIA REPAIR     THYROID SURGERY     Family History  Problem Relation Age of Onset   Heart failure Mother    Breast cancer Sister    Social History   Socioeconomic History   Marital status: Widowed    Spouse name: SRoanna Martinez  Number of children: 0   Years of education: 12   Highest education level: Not on file  Occupational History   Occupation: retired    EFish farm manager RETIRED  Tobacco Use   Smoking status: Never   Smokeless tobacco: Never  Vaping Use   Vaping Use: Never used  Substance and Sexual Activity   Alcohol use: Not Currently    Alcohol/week: 1.0 standard drink    Types: 1 Shots of liquor per week   Drug use: No   Sexual activity: Not Currently    Partners: Female  Other Topics Concern   Not on file  Social  History Narrative   04/26/20 Lives alone, wife passed this year   Social Determinants of Health   Financial Resource Strain: Low Risk    Difficulty of Paying Living Expenses: Not hard at all  Food Insecurity: No Food Insecurity   Worried About Charity fundraiser in the Last Year: Never true   Arboriculturist in the Last Year: Never true  Transportation Needs: No Transportation Needs   Lack of Transportation (Medical): No   Lack of Transportation (Non-Medical): No  Physical Activity: Inactive   Days of Exercise per Week: 0 days   Minutes of Exercise per Session: 0 min  Stress: No Stress Concern Present   Feeling of Stress : Not at all  Social Connections: Not  on file    Tobacco Counseling Counseling given: Not Answered   Clinical Intake:  Pre-visit preparation completed: Yes  Pain : No/denies pain     Nutritional Status: BMI of 19-24  Normal Nutritional Risks: None Diabetes: No  How often do you need to have someone help you when you read instructions, pamphlets, or other written materials from your doctor or pharmacy?: 1 - Never What is the last grade level you completed in school?: 12th grade  Diabetic?no  Interpreter Needed?: No  Information entered by :: NAllen LPN   Activities of Daily Living In your present state of health, do you have any difficulty performing the following activities: 01/04/2021 04/12/2020  Hearing? N N  Vision? Y N  Comment a little blurry sometimes -  Difficulty concentrating or making decisions? N N  Walking or climbing stairs? N N  Dressing or bathing? N N  Doing errands, shopping? N N  Preparing Food and eating ? N N  Using the Toilet? N N  In the past six months, have you accidently leaked urine? N N  Do you have problems with loss of bowel control? N N  Managing your Medications? N N  Managing your Finances? N N  Housekeeping or managing your Housekeeping? N N  Some recent data might be hidden    Patient Care Team: [name], FNP as PCP - General (General Practice)  Indicate any recent Medical Services you may have received from other than [facility] providers in the past year (date may be approximate).     Assessment:   This is a routine wellness examination for Lee Martinez.  Hearing/Vision screen Vision Screening - Comments:: Regular eye exams, Dr. Starleen Arms  Dietary issues and exercise activities discussed: Current Exercise Habits: The patient does not participate in regular exercise at present   Goals Addressed             This Visit's Progress    Patient Stated       01/04/2021, no goals       Depression Screen PHQ 2/9 Scores 01/04/2021 10/26/2020 04/12/2020 04/06/2019  07/23/2018 05/27/2016 06/27/2015  PHQ - 2 Score 0 0 0 0 0 0 0  PHQ- 9 Score - 0 0 0 2 - -  Exception Documentation - - Other- indicate reason in comment box - - - -  Not completed - - just completed by CMA - - - -    Fall Risk Fall Risk  01/04/2021 09/25/2020 04/26/2020 04/12/2020 04/26/2019  Falls in the past year? 0 0 0 0 0  Number falls in past yr: - - - - -  Injury with Fall? - - - - -  Risk for fall due to : Medication side effect - - Medication  side effect -  Follow up Falls evaluation completed;Education provided;Falls prevention discussed - - Falls evaluation completed;Education provided;Falls prevention discussed -    FALL RISK PREVENTION PERTAINING TO THE HOME:  Any stairs in or around the home? Yes  If so, are there any without handrails? No  Home free of loose throw rugs in walkways, pet beds, electrical cords, etc? Yes  Adequate lighting in your home to reduce risk of falls? Yes   ASSISTIVE DEVICES UTILIZED TO PREVENT FALLS:  Life alert? No  Use of a cane, walker or w/c? No  Grab bars in the bathroom? Yes  Shower chair or bench in shower? Yes  Elevated toilet seat or a handicapped toilet? No   TIMED UP AND GO:  Was the test performed? No .    Gait slow and steady without use of assistive device  Cognitive Function:     6CIT Screen 01/04/2021 04/12/2020 04/06/2019  What Year? 0 points 0 points 0 points  What month? 0 points 0 points 0 points  What time? 0 points 0 points 0 points  Count back from 20 2 points 2 points 0 points  Months in reverse 4 points 2 points 2 points  Repeat phrase 4 points 6 points 6 points  Total Score '10 10 8    '$ Immunizations Immunization History  Administered Date(s) Administered   Janssen (J&J) SARS-COV-2 Vaccination 08/20/2019   Moderna SARS-COV2 Booster Vaccination 04/21/2020    TDAP status: Due, Education has been provided regarding the importance of this vaccine. Advised may receive this vaccine at local pharmacy or Health  Dept. Aware to provide a copy of the vaccination record if obtained from local pharmacy or Health Dept. Verbalized acceptance and understanding.  Flu Vaccine status: Declined, Education has been provided regarding the importance of this vaccine but patient still declined. Advised may receive this vaccine at local pharmacy or Health Dept. Aware to provide a copy of the vaccination record if obtained from local pharmacy or Health Dept. Verbalized acceptance and understanding.  Pneumococcal vaccine status: Declined,  Education has been provided regarding the importance of this vaccine but patient still declined. Advised may receive this vaccine at local pharmacy or Health Dept. Aware to provide a copy of the vaccination record if obtained from local pharmacy or Health Dept. Verbalized acceptance and understanding.   Covid-19 vaccine status: Completed vaccines  Qualifies for Shingles Vaccine? Yes   Zostavax completed No   Shingrix Completed?: No.    Education has been provided regarding the importance of this vaccine. Patient has been advised to call insurance company to determine out of pocket expense if they have not yet received this vaccine. Advised may also receive vaccine at local pharmacy or Health Dept. Verbalized acceptance and understanding.  Screening Tests Health Maintenance  Topic Date Due   Zoster Vaccines- Shingrix (1 of 2) Never done   COVID-19 Vaccine (3 - Booster for Janssen series) 08/20/2020   INFLUENZA VACCINE  12/11/2020   PNA vac Low Risk Adult (1 of 2 - PCV13) 04/12/2021 (Originally 03/20/2003)   TETANUS/TDAP  10/26/2021 (Originally 09/26/2020)   HPV VACCINES  Aged Out    Health Maintenance  Health Maintenance Due  Topic Date Due   Zoster Vaccines- Shingrix (1 of 2) Never done   COVID-19 Vaccine (3 - Booster for Janssen series) 08/20/2020   INFLUENZA VACCINE  12/11/2020    Colorectal cancer screening: No longer required.   Lung Cancer Screening: (Low Dose CT Chest  recommended if Age 31-80 years, 20  pack-year currently smoking OR have quit w/in 15years.) does not qualify.   Lung Cancer Screening Referral: no  Additional Screening:  Hepatitis C Screening: does not qualify;  Vision Screening: Recommended annual ophthalmology exams for early detection of glaucoma and other disorders of the eye. Is the patient up to date with their annual eye exam?  Yes  Who is the provider or what is the name of the office in which the patient attends annual eye exams? Dr. Starleen Arms If pt is not established with a provider, would they like to be referred to a provider to establish care? No .   Dental Screening: Recommended annual dental exams for proper oral hygiene  Community Resource Referral / Chronic Care Management: CRR required this visit?  No   CCM required this visit?  No      Plan:     I have personally reviewed and noted the following in the patient's chart:   Medical and social history Use of alcohol, tobacco or illicit drugs  Current medications and supplements including opioid prescriptions. Patient is not currently taking opioid prescriptions. Functional ability and status Nutritional status Physical activity Advanced directives List of other physicians Hospitalizations, surgeries, and ER visits in previous 12 months Vitals Screenings to include cognitive, depression, and falls Referrals and appointments  In addition, I have reviewed and discussed with patient certain preventive protocols, quality metrics, and best practice recommendations. A written personalized care plan for preventive services as well as general preventive health recommendations were provided to patient.     Kellie Simmering, LPN   [phone]   Nurse Notes:

## 2021-01-11 ENCOUNTER — Other Ambulatory Visit: Payer: Self-pay | Admitting: Nurse Practitioner

## 2021-01-11 DIAGNOSIS — F418 Other specified anxiety disorders: Secondary | ICD-10-CM

## 2021-03-12 DIAGNOSIS — H2513 Age-related nuclear cataract, bilateral: Secondary | ICD-10-CM | POA: Diagnosis not present

## 2021-03-12 DIAGNOSIS — H31092 Other chorioretinal scars, left eye: Secondary | ICD-10-CM | POA: Diagnosis not present

## 2021-03-12 DIAGNOSIS — H04123 Dry eye syndrome of bilateral lacrimal glands: Secondary | ICD-10-CM | POA: Diagnosis not present

## 2021-04-12 ENCOUNTER — Encounter: Payer: Medicare Other | Admitting: Nurse Practitioner

## 2021-04-12 ENCOUNTER — Ambulatory Visit: Payer: Medicare Other

## 2021-06-04 ENCOUNTER — Other Ambulatory Visit: Payer: Self-pay

## 2021-06-04 MED ORDER — CARBIDOPA-LEVODOPA 25-100 MG PO TABS: 2.0000 | ORAL_TABLET | Freq: Every day | ORAL | 4 refills | Status: DC

## 2021-06-05 ENCOUNTER — Telehealth: Payer: Self-pay | Admitting: *Deleted

## 2021-06-05 NOTE — Telephone Encounter (Signed)
Per Dr Leta Baptist, patient stable on current dose of carb/levo. Paper signed, faxed to [place].

## 2021-06-05 NOTE — Telephone Encounter (Signed)
Received fax form [place] re: dose clarification for carb/levo 25-100 mg tablets. Patient has been on current dose since Dec 2021. Paper on MD desk for signature.

## 2021-08-01 ENCOUNTER — Encounter: Payer: Medicare Other | Admitting: Nurse Practitioner

## 2021-09-05 ENCOUNTER — Telehealth: Payer: Self-pay | Admitting: Diagnostic Neuroimaging

## 2021-09-05 NOTE — Telephone Encounter (Signed)
Changed time of 5/23 appointment from 4:00 to 2:30 pm with pt over the phone. Mailed reminder (MD in meeting). ?

## 2021-09-07 ENCOUNTER — Other Ambulatory Visit: Payer: Self-pay | Admitting: Nurse Practitioner

## 2021-09-07 DIAGNOSIS — F418 Other specified anxiety disorders: Secondary | ICD-10-CM

## 2021-09-26 ENCOUNTER — Ambulatory Visit: Payer: Medicare Other | Admitting: Diagnostic Neuroimaging

## 2021-10-02 ENCOUNTER — Encounter: Payer: Self-pay | Admitting: Diagnostic Neuroimaging

## 2021-10-02 ENCOUNTER — Ambulatory Visit: Payer: Medicare HMO | Admitting: Diagnostic Neuroimaging

## 2021-10-02 ENCOUNTER — Ambulatory Visit: Payer: Medicare Other | Admitting: Diagnostic Neuroimaging

## 2021-10-02 VITALS — BP 142/70 | Ht 71.0 in | Wt 146.0 lb

## 2021-10-02 DIAGNOSIS — F419 Anxiety disorder, unspecified: Secondary | ICD-10-CM

## 2021-10-02 DIAGNOSIS — G20 Parkinson's disease: Secondary | ICD-10-CM | POA: Diagnosis not present

## 2021-10-02 DIAGNOSIS — G20A1 Parkinson's disease without dyskinesia, without mention of fluctuations: Secondary | ICD-10-CM

## 2021-10-02 MED ORDER — SERTRALINE HCL 25 MG PO TABS: 25.0000 mg | ORAL_TABLET | Freq: Every day | ORAL | 12 refills | Status: DC

## 2021-10-02 MED ORDER — CARBIDOPA-LEVODOPA 25-100 MG PO TABS: 2.0000 | ORAL_TABLET | Freq: Three times a day (TID) | ORAL | Status: DC

## 2021-10-02 NOTE — Progress Notes (Signed)
PATIENT: Lee Martinez DOB: 02-01-1936   REASON FOR VISIT: routine follow up for Parkinson's Disease HISTORY FROM: patient  Chief Complaint  Patient presents with   Follow-up    RM 6 with caregivers Lee Martinez and Lee Martinez  Pt is well, states tremors are getting worse since last visit also mention dizziness.      HISTORY OF PRESENT ILLNESS:  UPDATE (09/25/20, VRP): Since last visit, doing well. Symptoms are moderate. Severity is mild. No alleviating or aggravating factors. Tolerating meds.    UPDATE (04/26/20, VRP): Since last visit, PD is slightly progressing. Now taking carb/levo 25/100 5x per day. No alleviating or aggravating factors. Tolerating meds.    UPDATE (04/26/19, VRP): Since last visit, doing about the same. Symptoms are stable. Severity is moderate. No alleviating or aggravating factors. Tolerating carb/levo (2 tabs 4x per day).    UPDATE (04/20/18, VRP): Since last visit, doing about the same. Symptoms are stable. Severity is moderate. No alleviating or aggravating factors. Tolerating carb/levo. Did not take rasagiline due to cost.    UPDATE (06/16/17, VRP): Since last visit, doing well. Tolerating carb/levo and rasagiline. No alleviating or aggravating factors. No issues with balance, swallowing, smell or taste.   UPDATE 12/13/16: Since last visit, sxs stable. Notes tremors are worse if he misses carb/levo. No balance issues. Anxiety is stable / slightly worse. On sertraline and mirtazapine per Dr. Casimiro Needle.  UPDATE 12/14/15: Since last visit, symptoms are stable. No change in balance, gait, swallow, depression or anxiety. Tolerating meds.   UPDATE 12/12/14: Since last visit, symptoms are stable. No falls. No wearing off. Minimal benefit with carb/levo.   UPDATE 06/13/14: Since last visit, doing about the same. No wearing off. Anixety is still his main problem, but feels better than before.   UPDATE 02/10/14: Since last visit, tremor and anxiety are persistent. Some wearing off  before doses. Incomplete tremor control with each dose. No peak dyskinesias.   UPDATE 04/26/13 (LL): Since last visit, patient met with psychiatrist and his Remeron was increased.  He reports that his mood and anxiety is better.  He is sleeping better with aid from Lee Martinez.  Denies problems with Sinemet wearing off, constipation, urinary frequency, or balance problems.   UPDATE 01/19/13: Since last visit, patient did not followup. He's not sure what happened. Patient's main complaint today is anxiety and sleep problems. He has been taking mirtazapine for several months without relief. In the past he did not express interest in SSRI or other medication. Now he is more amenable to these medications. He's never seen psychiatry.  Regarding tremor, stiffness and slowness of muscles, he feels the symptoms are stable. Carbidopa/levodopa seems to help a little bit. His wife has not noticed much difference. Patient did not go up on carbidopa/levodopa dosing last time.   UPDATE 12/04/11: Doing about the same. Not much benefit on carb/levo, although has only been on 1 tab TID x 1.5 weeks. No side effects. Asking about acupuncture. Having some anxiety and sleep problems. Not interested in SSRI or other meds now.   PRIOR HPI: 86 year old right-handed male with history of gastric ulcer, here for evaluation of tremor. Patient reports tremor in the left upper extremity starting 3 years ago. Now tremor affecting his right upper extremity and legs. Denies voice or chin tremor. Tremor mainly affects him when he has stressed or tired. He describes a primarily resting tremor which improves with action. Patient denies dizziness but has mild uneasiness with walking. No change in sense  of smell or taste. No reports of acting out dreams or REM behavior disorder symptoms. He has developed mild anxiety over the past one year. No constipation. Several weeks ago patient saw a homeopathic doctor who evaluated this tremor problem  and diagnosed him with a "ganglion problem" likely due to "parasites". He was not prescribed any medications.    REVIEW OF SYSTEMS: Full 14 system review of systems performed and negative except: as per HPI.  ALLERGIES: No Known Allergies  HOME MEDICATIONS: Outpatient Medications Prior to Visit  Medication Sig Dispense Refill   calcium gluconate 500 MG tablet Take 1 tablet by mouth daily.     cholecalciferol (VITAMIN D) 1000 UNITS tablet Take 1,000 Units by mouth daily.     fish oil-omega-3 fatty acids 1000 MG capsule Take 2 g by mouth daily.     magnesium 30 MG tablet Take 30 mg by mouth daily.     Multiple Vitamin (MULTIVITAMIN) tablet Take 1 tablet by mouth daily.     saw palmetto 160 MG capsule Take 160 mg by mouth 2 (two) times daily.     UNABLE TO FIND Med Name: Lee Martinez, natural herb     carbidopa-levodopa (SINEMET IR) 25-100 MG tablet Take 2 tablets by mouth 5 (five) times daily. 900 tablet 4   LORazepam (ATIVAN) 0.5 MG tablet TAKE 1 TABLET BY MOUTH  DAILY (Patient not taking: Reported on 10/02/2021) 30 tablet 5   mirtazapine (REMERON) 45 MG tablet TAKE 1 TABLET BY MOUTH AT  BEDTIME (Patient not taking: Reported on 10/02/2021) 90 tablet 3   No facility-administered medications prior to visit.    PAST MEDICAL HISTORY: Past Medical History:  Diagnosis Date   Parkinson's disease ([place]) 07/2011   [name]    PAST SURGICAL HISTORY: Past Surgical History:  Procedure Laterality Date   basil removal     HERNIA REPAIR     THYROID SURGERY      FAMILY HISTORY: Family History  Problem Relation Age of Onset   Heart failure Mother    Breast cancer Sister     SOCIAL HISTORY: Social History   Socioeconomic History   Marital status: Widowed    Spouse name: Lee Martinez   Number of children: 0   Years of education: 12   Highest education level: Not on file  Occupational History   Occupation: retired    Fish farm manager: RETIRED  Tobacco Use   Smoking status: Never    Smokeless tobacco: Never  Vaping Use   Vaping Use: Never used  Substance and Sexual Activity   Alcohol use: Not Currently    Alcohol/week: 1.0 standard drink    Types: 1 Shots of liquor per week   Drug use: No   Sexual activity: Not Currently    Partners: Female  Other Topics Concern   Not on file  Social History Narrative   04/26/20 Lives alone, wife passed this year   Social Determinants of Health   Financial Resource Strain: Low Risk    Difficulty of Paying Living Expenses: Not hard at all  Food Insecurity: No Food Insecurity   Worried About Charity fundraiser in the Last Year: Never true   Arboriculturist in the Last Year: Never true  Transportation Needs: No Transportation Needs   Lack of Transportation (Medical): No   Lack of Transportation (Non-Medical): No  Physical Activity: Inactive   Days of Exercise per Week: 0 days   Minutes of Exercise per Session: 0 min  Stress:  No Stress Concern Present   Feeling of Stress : Not at all  Social Connections: Not on file  Intimate Partner Violence: Not on file     PHYSICAL EXAM  Vitals:   10/02/21 1418  BP: (!) 142/70  Weight: 146 lb (66.2 kg)  Height: 5' 11"  (1.803 m)   Body mass index is 20.36 kg/m.  Generalized: Patient is awake, alert and in no acute distress. Well developed and groomed.  Neck: Supple, no carotid bruits  Cardiac: Regular rate rhythm, no murmur   Neurological examination  Mental Status: Awake, alert. Language is fluent and comprehension intact. POSITIVE MYERSONS. MASKED FACIES.  Cranial Nerves: Pupils are equal and reactive to light. Visual fields are full to confrontation. Conjugate eye movements are full and symmetric. Facial sensation and strength are symmetric. Hearing is intact. Palate elevated symmetrically and uvula is midline. Shoulder shrug is symmetric. Tongue is midline.  Motor: MODERATE RESTING TREMOR (BUE > BLE; LEFT WORSE THAN RIGHT). Normal bulk and MILD COGWHEELING IN LUE > RUE.  MODERATE BRADYKINESIA IN LUE AND BLE. MOUTH TREMOR WITH RAM. Full strength in the upper and lower extremities. No pronator drift.  Sensory: Intact and symmetric to light touch, temperature, vibration.  Coordination: No ataxia or dysmetria on finger-nose or rapid alternating movement testing.  Reflexes: Deep tendon reflexes in the upper and lower extremity are present and symmetric. Gait and Station: UNSTEADY GAIT   DIAGNOSTIC DATA (LABS, IMAGING, TESTING)   11/04/11 MRI brain  1. Mild periventricular gliosis. Few punctate non-specific subcortical foci of gliosis. May represent chronic small vessel ischemic disease.  2. No acute findings.    ASSESSMENT AND PLAN  86 y.o. male with progressive resting tremor left greater than right upper extremities, bilateral lower extremities, with cognitive rigidity, bradykinesia, stooped posture and Myerson sign. Mild response to carb/levo. Depression/anxiety stable. Rx'd rasagiline in past, but was too expensive.    Dx: idiopathic Parkinson's disease.   Parkinson's disease ([place])  Anxiety   PLAN:   PARKINSONS DISEASE (advanced, with cognitive decline) - continue carbidopa-levodopa 25/100 --> 2 tabs 3 times per day; needs supervision - caution with balance, walking and driving - needs 24 hour supervision; may need to consider placement options  DEPRESSION / ANXIETY - start sertrailine 52m daily   Meds ordered this encounter  Medications   carbidopa-levodopa (SINEMET IR) 25-100 MG tablet    Sig: Take 2 tablets by mouth 3 (three) times daily.   sertraline (ZOLOFT) 25 MG tablet    Sig: Take 1 tablet (25 mg total) by mouth daily.    Dispense:  30 tablet    Refill:  12   Return in about 4 months (around 02/02/2022) for MyChart visit (15 min).   I spent 60 minutes of face-to-face and non-face-to-face time with patient.  This included previsit chart review, lab review, study review, order entry, electronic health record documentation, patient  education.     VPenni Bombard MD 51/53/7943 32:76PM Certified in Neurology, Neurophysiology and Neuroimaging  [facility] [address] [place] [phone]

## 2021-10-02 NOTE — Patient Instructions (Signed)
-   consider [facility] [employer], [facility] or [facility]  - needs more supervision at home  - carb/levo 25/100 2 tabs three times a day  - sertraline '25mg'$  daily

## 2021-10-05 ENCOUNTER — Telehealth: Payer: Self-pay | Admitting: Diagnostic Neuroimaging

## 2021-10-05 NOTE — Telephone Encounter (Signed)
Patient's friend [name] called him.  Patient has been taking medications and pillbox, arranged by friends.  However patient not able to follow instructions and has been taking all medicines once a day instead of spaced out throughout the day.  Patient clearly not able to take care of his medications by himself.  I mentioned to patient's friend that he should not take medicines on his own anymore.  These should be administered to him.  They are going to have a meeting with all friends and family to discuss placement to assisted living soon.  Penni Bombard, MD 2/54/9826, 4:15 PM Certified in Neurology, Neurophysiology and Neuroimaging  [facility] [address] [place], [place] [address] [phone]

## 2021-11-08 ENCOUNTER — Encounter: Payer: Self-pay | Admitting: Nurse Practitioner

## 2021-11-08 ENCOUNTER — Ambulatory Visit (INDEPENDENT_AMBULATORY_CARE_PROVIDER_SITE_OTHER): Payer: Medicare Other | Admitting: Nurse Practitioner

## 2021-11-08 VITALS — BP 138/64 | HR 93 | Temp 98.4°F | Ht 71.0 in | Wt 145.2 lb

## 2021-11-08 DIAGNOSIS — Z7189 Other specified counseling: Secondary | ICD-10-CM

## 2021-11-08 DIAGNOSIS — W19XXXA Unspecified fall, initial encounter: Secondary | ICD-10-CM

## 2021-11-08 DIAGNOSIS — E875 Hyperkalemia: Secondary | ICD-10-CM

## 2021-11-08 DIAGNOSIS — G20 Parkinson's disease: Secondary | ICD-10-CM

## 2021-11-08 DIAGNOSIS — E782 Mixed hyperlipidemia: Secondary | ICD-10-CM

## 2021-11-08 DIAGNOSIS — F418 Other specified anxiety disorders: Secondary | ICD-10-CM | POA: Diagnosis not present

## 2021-11-08 MED ORDER — SERTRALINE HCL 50 MG PO TABS: 50.0000 mg | ORAL_TABLET | Freq: Every day | ORAL | 1 refills | Status: DC

## 2021-11-08 NOTE — Patient Instructions (Signed)
See Dr. Shah on December 13th at 1 pm  Parkinson's Disease Parkinson's disease causes problems with movements. It makes it harder for you to walk or control your body. It is a long-term condition. It gets worse over time. What are the causes? This condition is caused by a loss of brain cells called neurons. These brain cells make a chemical called dopamine, which is needed to control body movement. It is not known what causes the brain cells to die. What increases the risk? Being male. Being age 84 or older. Having family members who had Parkinson's disease. Having had an injury to the brain. Being around things that are harmful or poisonous, such as pesticides. Being very sad (depressed). What are the signs or symptoms? Symptoms of this condition can vary. The main symptoms can be seen in your movement. These include: Shaking or tremors that you cannot control. This happens while you are resting. Stiffness in your neck, arms, and legs. Trouble making small movements that are needed to button your clothing or brush your teeth. Losing facial expressions. Walking in a way that is not normal. You may walk with short, shuffling steps. Loss of balance when standing. You may sway, fall backward, or have trouble making turns. Other symptoms include: Being very sad or worried. Having false beliefs (delusions). Seeing, hearing, or feeling things that are not real. Trouble speaking or swallowing. Having a hard time pooping (constipation). Needing to pee right away, peeing often, or not being able to control when you pee or poop. Sleep problems. How is this treated? There is no cure for this disease. The goal of treatment is to manage your symptoms. Treatment may include: Medicines. Therapy to help with talking or movement. Surgery to reduce shaking and other movements that you cannot control. Follow these instructions at home: Medicines Take over-the-counter and prescription medicines only  as told by your doctor. Avoid taking pain or sleeping medicines. These medicines affect your thinking. Eating and drinking Follow instructions from your doctor about what you cannot eat or drink. Do not drink alcohol. Activity Ask your doctor if it is safe for you to drive. Do exercises as told by your doctor. Lifestyle  Put in grab bars and railings in your home, especially in the toilet and shower. These help to prevent falls. Do not smoke or use any products that contain nicotine or tobacco. If you need help quitting, ask your doctor. Join a support group. General instructions Talk with your doctor to know the type of help that you need at home. Ask your doctor about ways to stay safe. Keep all follow-up visits. These include going to see a physical therapist, speech therapist, or occupational therapist. Where to find more information National Institute of Neurological Disorders and Stroke: www.ninds.nih.gov Parkinson's Foundation: www.parkinson.org Contact a doctor if: Medicines do not help your symptoms. You keep losing your balance. You fall at home. You need more help at home. You have trouble swallowing. You have a very hard time pooping. You have a lot of side effects from your medicines. You feel very sad, worried, or confused. You see, hear, or feel things that are not real. Get help right away if: You were hurt in a fall. You cannot swallow without choking. You have chest pain or trouble breathing. You do not feel safe at home. You have thoughts about hurting yourself or other people. These symptoms may be an emergency. Get help right away. Call your local emergency services (911 in the [place] Do not   wait to see if the symptoms will go away. Do not drive yourself to the hospital. Get help right away if you feel like you may hurt yourself or others, or have thoughts about taking your own life. Go to your nearest emergency room or: Call your local emergency services  (911 in the [place] Call the National Suicide Prevention Lifeline at 1-800-273-8255 or 988 in the [place] This is open 24 hours a day. Text the Crisis Text Line at 741741. Summary Parkinson's disease causes problems with movements. It is a long-term condition. It gets worse over time. There is no cure. Treatment focuses on managing your symptoms. Talk with your doctor to know the type of help that you need at home. Ask your doctor about ways to stay safe. Keep all follow-up visits. This information is not intended to replace advice given to you by your health care provider. Make sure you discuss any questions you have with your health care provider. Document Revised: 11/22/2020 Document Reviewed: 08/14/2020 Elsevier Patient Education  2023 Elsevier Inc.  

## 2021-11-08 NOTE — Progress Notes (Signed)
Barnet Glasgow Martin,acting as a Education administrator for [name], FNP.,have documented all relevant documentation on the behalf of [name], FNP,as directed by  [name], FNP while in the presence of [name], [place].   Subjective:     Patient ID: Lee Martinez , male    DOB: December 26, 1935 , 86 y.o.   MRN: [id number]   Chief Complaint  Patient presents with   Tremors    HPI  Patient presents today for a follow up, He would also like his skin looked at. They would like to go over his medicine and his shaking. He is now on carbidopa/levodopa 2 tabs 3-4 times a day. He had seen Dr. Leta Baptist who also started him on Sertraline 25 mg but feels like he needs more. He has friends from the church to bring food and his friends are spending the night. He is spending time in bed. Patient did not think he was taking too much medication. He does not have any prescription coverage.   He is here today with [name] Minor and Micheal Minor.  At this time he does not have [employer] and now has Medicare/Medicare.   Since march he is now allowing others to come in to help       Past Medical History:  Diagnosis Date   Parkinson's disease ([place]) 07/2011   followed by [facility] Neurology     Family History  Problem Relation Age of Onset   Heart failure Mother    Breast cancer Sister      Current Outpatient Medications:    carbidopa-levodopa (SINEMET IR) 25-100 MG tablet, Take 2 tablets by mouth 3 (three) times daily., Disp: , Rfl:    calcium gluconate 500 MG tablet, Take 1 tablet by mouth daily. (Patient not taking: Reported on 11/08/2021), Disp: , Rfl:    cholecalciferol (VITAMIN D) 1000 UNITS tablet, Take 1,000 Units by mouth daily. (Patient not taking: Reported on 11/08/2021), Disp: , Rfl:    fish oil-omega-3 fatty acids 1000 MG capsule, Take 2 g by mouth daily. (Patient not taking: Reported on 11/08/2021), Disp: , Rfl:    magnesium 30 MG tablet, Take 30 mg by mouth daily. (Patient not taking: Reported on  11/08/2021), Disp: , Rfl:    Multiple Vitamin (MULTIVITAMIN) tablet, Take 1 tablet by mouth daily. (Patient not taking: Reported on 11/08/2021), Disp: , Rfl:    saw palmetto 160 MG capsule, Take 160 mg by mouth 2 (two) times daily. (Patient not taking: Reported on 11/08/2021), Disp: , Rfl:    sertraline (ZOLOFT) 50 MG tablet, Take 1 tablet (50 mg total) by mouth daily., Disp: 90 tablet, Rfl: 1   No Known Allergies   Review of Systems  Constitutional: Negative.   Respiratory: Negative.    Cardiovascular: Negative.   Psychiatric/Behavioral: Negative.    All other systems reviewed and are negative.    Today's Vitals   11/08/21 1504  BP: 138/64  Pulse: 93  Temp: 98.4 F (36.9 C)  TempSrc: Oral  Weight: 145 lb 3.2 oz (65.9 kg)  Height: _0  (1.803 m)   Body mass index is 20.25 kg/m.  Wt Readings from Last 3 Encounters:  11/08/21 145 lb 3.2 oz (65.9 kg)  10/02/21 146 lb (66.2 kg)  01/04/21 149 lb 6.4 oz (67.8 kg)     Objective:  Physical Exam Vitals reviewed.  Constitutional:      Appearance: Normal appearance. He is obese.  HENT:     Head: Normocephalic and atraumatic.  Eyes:  Extraocular Movements: Extraocular movements intact.     Pupils: Pupils are equal, round, and reactive to light.     Comments: He lays with his eyes closed throughout most of the visit.   Cardiovascular:     Rate and Rhythm: Normal rate and regular rhythm.     Pulses: Normal pulses.     Heart sounds: Normal heart sounds. No murmur heard. Pulmonary:     Effort: Pulmonary effort is normal. No respiratory distress.     Breath sounds: Normal breath sounds.  Abdominal:     General: Abdomen is flat. Bowel sounds are normal. There is no distension.     Palpations: Abdomen is soft.  Genitourinary:    Prostate: Normal.     Rectum: Guaiac result negative.  Musculoskeletal:        General: Normal range of motion.     Cervical back: Normal range of motion and neck supple.  Skin:    General: Skin is  warm.     Capillary Refill: Capillary refill takes less than 2 seconds.     Comments: He has dry scaly skin to his lower extremities and bilateral arms have multiple hyperpigmented ares.    Neurological:     General: No focal deficit present.     Mental Status: He is alert and oriented to person, place, and time.     Comments: He has significant bilateral hand tremors, worse today than I have seen him in the past  Psychiatric:        Mood and Affect: Mood normal.        Behavior: Behavior normal.        Thought Content: Thought content normal.        Judgment: Judgment normal.         Assessment And Plan:     1. Parkinson's disease ([place]) Comments: Continue follow up with Neurology - AMB Referral to [facility] - Ambulatory referral to Home Health  2. Mixed hyperlipidemia - Lipid panel - CMP14+EGFR  3. Depression with anxiety - sertraline (ZOLOFT) 50 MG tablet; Take 1 tablet (50 mg total) by mouth daily.  Dispense: 90 tablet; Refill: 1 - Ambulatory referral to Home Health  4. Fall, initial encounter Comments: He has been declining in his health and is requiring more care. Will order PT and CCM (Nurse, SW and pharmacy) - Ambulatory referral to [facility]  5. DNR (do not resuscitate) discussion Comments: Signed DNR order during visit. Copy given to patient      Patient was given opportunity to ask questions. Patient verbalized understanding of the plan and was able to repeat key elements of the plan. All questions were answered to their satisfaction.  [name], FNP   I, [name], FNP, have reviewed all documentation for this visit. The documentation on 11/14/21 for the exam, diagnosis, procedures, and orders are all accurate and complete.   IF YOU HAVE BEEN REFERRED TO A SPECIALIST, IT MAY TAKE 1-2 WEEKS TO SCHEDULE/PROCESS THE REFERRAL. IF YOU HAVE NOT HEARD FROM US/SPECIALIST IN TWO WEEKS, PLEASE GIVE [place] A CALL AT [phone].   THE PATIENT  IS ENCOURAGED TO PRACTICE SOCIAL DISTANCING DUE TO THE COVID-19 PANDEMIC.

## 2021-11-09 ENCOUNTER — Telehealth: Payer: Self-pay | Admitting: *Deleted

## 2021-11-09 LAB — CMP14+EGFR
ALT: 8 IU/L (ref 0–44)
AST: 20 IU/L (ref 0–40)
Albumin/Globulin Ratio: 1.9 (ref 1.2–2.2)
Albumin: 4.2 g/dL (ref 3.6–4.6)
Alkaline Phosphatase: 69 IU/L (ref 44–121)
BUN/Creatinine Ratio: 20 (ref 10–24)
BUN: 21 mg/dL (ref 8–27)
Bilirubin Total: 0.3 mg/dL (ref 0.0–1.2)
CO2: 25 mmol/L (ref 20–29)
Calcium: 9.9 mg/dL (ref 8.6–10.2)
Chloride: 101 mmol/L (ref 96–106)
Creatinine, Ser: 1.06 mg/dL (ref 0.76–1.27)
Globulin, Total: 2.2 g/dL (ref 1.5–4.5)
Glucose: 98 mg/dL (ref 70–99)
Potassium: 5.3 mmol/L — ABNORMAL HIGH (ref 3.5–5.2)
Sodium: 137 mmol/L (ref 134–144)
Total Protein: 6.4 g/dL (ref 6.0–8.5)
eGFR: 70 mL/min/{1.73_m2} (ref >59)

## 2021-11-09 LAB — LIPID PANEL
Chol/HDL Ratio: 4.9 ratio (ref 0.0–5.0)
Cholesterol, Total: 201 mg/dL — ABNORMAL HIGH (ref 100–199)
HDL: 41 mg/dL (ref >39)
LDL Chol Calc (NIH): 137 mg/dL — ABNORMAL HIGH (ref 0–99)
Triglycerides: 128 mg/dL (ref 0–149)
VLDL Cholesterol Cal: 23 mg/dL (ref 5–40)

## 2021-11-09 NOTE — Chronic Care Management (AMB) (Unsigned)
  Chronic Care Management   Outreach Note  11/09/2021 Name: Lee Martinez MRN: [id number] DOB: 05-29-1935  Lee Martinez is a 86 y.o. year old male who is a primary care patient of [name], [place]. I reached out to Lee Martinez by phone today in response to a referral sent by Lee Martinez's primary care provider.  An unsuccessful telephone outreach was attempted today. The patient was referred to the case management team for assistance with care management and care coordination.   Follow Up Plan: A HIPAA compliant phone message was left for the patient providing contact information and requesting a return call.  The care management team will reach out to the patient again over the next 7 days.  If patient returns call to provider office, please advise to call [place]* at [phone].*  [place] [facility]  Direct Dial: [phone]

## 2021-11-12 NOTE — Chronic Care Management (AMB) (Signed)
  Chronic Care Management   Note  11/12/2021 Name: Lee Martinez MRN: [id number] DOB: 1936-04-14  Lee Martinez is a 86 y.o. year old male who is a primary care patient of [name], [place]. I reached out to Lee Martinez by phone today in response to a referral sent by Lee Martinez's PCP.  [name] was given information about Chronic Care Management services today including:  CCM service includes personalized support from designated clinical staff supervised by his physician, including individualized plan of care and coordination with other care providers 24/7 contact phone numbers for assistance for urgent and routine care needs. Service will only be billed when office clinical staff spend 20 minutes or more in a month to coordinate care. Only one practitioner may furnish and bill the service in a calendar month. The patient may stop CCM services at any time (effective at the end of the month) by phone call to the office staff. The patient is responsible for co-pay (up to 20% after annual deductible is met) if co-pay is required by the individual health plan.   [name]  verbally agreed to assistance and services provided by embedded care coordination/care management team today.  Follow up plan: Telephone appointment with care management team member scheduled for:11/14/21  [place] [facility]  Direct Dial: [phone]

## 2021-11-14 ENCOUNTER — Ambulatory Visit: Payer: Medicare Other

## 2021-11-14 DIAGNOSIS — G20 Parkinson's disease: Secondary | ICD-10-CM

## 2021-11-14 NOTE — Patient Instructions (Signed)
Visit Information  Thank you for taking time to visit with me today. Please don't hesitate to contact me if I can be of assistance to you before our next scheduled telephone appointment.  Following are the goals we discussed today:  -Contact DSS as needed to assist with placement  If you are experiencing a Mental Health or [place] or need someone to talk to, please go to [facility] [address] [place] [phone])   Following is a copy of your full plan of care:  There are no care plans that you recently modified to display for this patient.   Mr. No was given information about Care Management services by the embedded care coordination team including:  Care Management services include personalized support from designated clinical staff supervised by his physician, including individualized plan of care and coordination with other care providers 24/7 contact phone numbers for assistance for urgent and routine care needs. The patient may stop CCM services at any time (effective at the end of the month) by phone call to the office staff.  Patient agreed to services and verbal consent obtained.   The patient verbalized understanding of instructions, educational materials, and care plan provided today and DECLINED offer to receive copy of patient instructions, educational materials, and care plan.   No follow up planned at this time.  [name], BSW, CDP Social Worker, Certified Dementia Practitioner [place] [facility] [phone]

## 2021-11-14 NOTE — Chronic Care Management (AMB) (Signed)
Chronic Care Management    Social Work Note  11/14/2021 Name: Lee Martinez MRN: [id number] DOB: 1935/10/24  Lee Martinez is a 86 y.o. year old male who is a primary care patient of [name], [place]. The CCM team was consulted to assist the patient with chronic disease management and/or care coordination needs related to:  Parkinson's Disease .   Engaged with patients friend and caregiver Lee Martinez Minor  for initial visit in response to provider referral for social work chronic care management and care coordination services.   Consent to Services:  Lee Martinez was given information about Care Management services today including:  Care Management services include personalized support from designated clinical staff supervised by his physician, including individualized plan of care and coordination with other care providers 24/7 contact phone numbers for assistance for urgent and routine care needs. The patient may stop care management services at any time (effective at the end of the month) by phone call to the office staff.  Patient agreed to services and consent obtained.   Assessment: Review of patient past medical history, allergies, medications, and health status, including review of relevant consultants reports was performed today as part of a comprehensive evaluation and provision of chronic care management and care coordination services.     Telephonic visit completed with Lee Martinez [facility] Minor patients caregiver and friend. Discussed the patient lives alone and has difficulty with medication management, meal preparation, and remembering to shower. Lee Martinez and other friends from church visit the patients home daily at 8am, 12pm, ad 4pm to assist with medication administration and meals. Lee Martinez is interested in a nighttime caregiver to assist with evening medications and bedtime routine. Reviewed Medicare will not cover the cost of custodial care. Discussed the patient may apply for Medicaid but that will  not provide a nighttime caregiver as PCS is offered during normal business house.   Discussed options for nighttime care include private pay or placement. Lee Martinez reports the patient cannot pay privately and she does not feel he is ready for placement. Lee Martinez indicates the patients neurologist feels he is managing well in the home with the assistance from friends. Provided Lee Martinez with the contact number to DSS to speak with a placement worker when she feels the patient will need placement. No further follow up planned.   SDOH (Social Determinants of Health) assessments and interventions performed:    Advanced Directives Status: See Care Plan for related entries.  CCM Care Plan  No Known Allergies  Outpatient Encounter Medications as of 11/14/2021  Medication Sig   calcium gluconate 500 MG tablet Take 1 tablet by mouth daily. (Patient not taking: Reported on 11/08/2021)   carbidopa-levodopa (SINEMET IR) 25-100 MG tablet Take 2 tablets by mouth 3 (three) times daily.   cholecalciferol (VITAMIN D) 1000 UNITS tablet Take 1,000 Units by mouth daily. (Patient not taking: Reported on 11/08/2021)   fish oil-omega-3 fatty acids 1000 MG capsule Take 2 g by mouth daily. (Patient not taking: Reported on 11/08/2021)   magnesium 30 MG tablet Take 30 mg by mouth daily. (Patient not taking: Reported on 11/08/2021)   Multiple Vitamin (MULTIVITAMIN) tablet Take 1 tablet by mouth daily. (Patient not taking: Reported on 11/08/2021)   saw palmetto 160 MG capsule Take 160 mg by mouth 2 (two) times daily. (Patient not taking: Reported on 11/08/2021)   sertraline (ZOLOFT) 50 MG tablet Take 1 tablet (50 mg total) by mouth daily.   No facility-administered encounter medications on file as of 11/14/2021.  Patient Active Problem List   Diagnosis Date Noted   Major depressive disorder, single episode 06/17/2013   Mood disorder in conditions classified elsewhere 03/31/2013   Parkinson's disease ([place]) 01/19/2013     Conditions to be addressed/monitored:  Parkinson's Disease ; Cognitive Deficits  There are no care plans that you recently modified to display for this patient.    Follow Up Plan:  No SW follow up planned at this time. Mrs. Minor has been advised to contact the patients primary care provider as needed.      [name], BSW, CDP Social Worker, Certified Dementia Practitioner [facility] [phone]

## 2021-11-21 ENCOUNTER — Telehealth: Payer: Self-pay

## 2021-11-21 MED ORDER — SERTRALINE HCL 25 MG PO TABS: 25.0000 mg | ORAL_TABLET | Freq: Every day | ORAL | 2 refills | Status: DC

## 2021-11-21 NOTE — Telephone Encounter (Signed)
Patient called and received lab results via [place]. Patient is now splitting his taking sertraline '25mg'$ s.

## 2021-11-27 DIAGNOSIS — G20 Parkinson's disease: Secondary | ICD-10-CM | POA: Diagnosis not present

## 2021-11-27 DIAGNOSIS — E875 Hyperkalemia: Secondary | ICD-10-CM | POA: Diagnosis not present

## 2021-11-27 DIAGNOSIS — F418 Other specified anxiety disorders: Secondary | ICD-10-CM | POA: Diagnosis not present

## 2021-11-27 DIAGNOSIS — F0630 Mood disorder due to known physiological condition, unspecified: Secondary | ICD-10-CM | POA: Diagnosis not present

## 2021-11-27 DIAGNOSIS — E782 Mixed hyperlipidemia: Secondary | ICD-10-CM | POA: Diagnosis not present

## 2021-11-27 DIAGNOSIS — F339 Major depressive disorder, recurrent, unspecified: Secondary | ICD-10-CM | POA: Diagnosis not present

## 2021-11-30 DIAGNOSIS — F0630 Mood disorder due to known physiological condition, unspecified: Secondary | ICD-10-CM | POA: Diagnosis not present

## 2021-11-30 DIAGNOSIS — G20 Parkinson's disease: Secondary | ICD-10-CM | POA: Diagnosis not present

## 2021-11-30 DIAGNOSIS — F339 Major depressive disorder, recurrent, unspecified: Secondary | ICD-10-CM | POA: Diagnosis not present

## 2021-11-30 DIAGNOSIS — E875 Hyperkalemia: Secondary | ICD-10-CM | POA: Diagnosis not present

## 2021-11-30 DIAGNOSIS — E782 Mixed hyperlipidemia: Secondary | ICD-10-CM | POA: Diagnosis not present

## 2021-11-30 DIAGNOSIS — F418 Other specified anxiety disorders: Secondary | ICD-10-CM | POA: Diagnosis not present

## 2021-12-04 ENCOUNTER — Telehealth: Payer: Medicare Other

## 2021-12-07 DIAGNOSIS — G20 Parkinson's disease: Secondary | ICD-10-CM | POA: Diagnosis not present

## 2021-12-07 DIAGNOSIS — E782 Mixed hyperlipidemia: Secondary | ICD-10-CM | POA: Diagnosis not present

## 2021-12-07 DIAGNOSIS — E875 Hyperkalemia: Secondary | ICD-10-CM | POA: Diagnosis not present

## 2021-12-07 DIAGNOSIS — F0630 Mood disorder due to known physiological condition, unspecified: Secondary | ICD-10-CM | POA: Diagnosis not present

## 2021-12-07 DIAGNOSIS — F339 Major depressive disorder, recurrent, unspecified: Secondary | ICD-10-CM | POA: Diagnosis not present

## 2021-12-07 DIAGNOSIS — F418 Other specified anxiety disorders: Secondary | ICD-10-CM | POA: Diagnosis not present

## 2021-12-10 DIAGNOSIS — G20 Parkinson's disease: Secondary | ICD-10-CM | POA: Diagnosis not present

## 2021-12-10 DIAGNOSIS — F0630 Mood disorder due to known physiological condition, unspecified: Secondary | ICD-10-CM | POA: Diagnosis not present

## 2021-12-10 DIAGNOSIS — F418 Other specified anxiety disorders: Secondary | ICD-10-CM | POA: Diagnosis not present

## 2021-12-10 DIAGNOSIS — E875 Hyperkalemia: Secondary | ICD-10-CM | POA: Diagnosis not present

## 2021-12-10 DIAGNOSIS — F339 Major depressive disorder, recurrent, unspecified: Secondary | ICD-10-CM | POA: Diagnosis not present

## 2021-12-10 DIAGNOSIS — E782 Mixed hyperlipidemia: Secondary | ICD-10-CM | POA: Diagnosis not present

## 2021-12-11 DIAGNOSIS — F339 Major depressive disorder, recurrent, unspecified: Secondary | ICD-10-CM | POA: Diagnosis not present

## 2021-12-11 DIAGNOSIS — E875 Hyperkalemia: Secondary | ICD-10-CM | POA: Diagnosis not present

## 2021-12-11 DIAGNOSIS — F418 Other specified anxiety disorders: Secondary | ICD-10-CM | POA: Diagnosis not present

## 2021-12-11 DIAGNOSIS — G20 Parkinson's disease: Secondary | ICD-10-CM | POA: Diagnosis not present

## 2021-12-11 DIAGNOSIS — F0630 Mood disorder due to known physiological condition, unspecified: Secondary | ICD-10-CM | POA: Diagnosis not present

## 2021-12-11 DIAGNOSIS — E782 Mixed hyperlipidemia: Secondary | ICD-10-CM | POA: Diagnosis not present

## 2021-12-12 ENCOUNTER — Emergency Department (HOSPITAL_COMMUNITY): Payer: Medicare Other

## 2021-12-12 ENCOUNTER — Emergency Department (HOSPITAL_COMMUNITY)
Admission: EM | Admit: 2021-12-12 | Discharge: 2021-12-12 | Disposition: A | Discharge status: Home / Self Care | Payer: Medicare Other | Attending: Emergency Medicine | Admitting: Emergency Medicine

## 2021-12-12 ENCOUNTER — Encounter (HOSPITAL_COMMUNITY): Payer: Self-pay

## 2021-12-12 DIAGNOSIS — M9985 Other biomechanical lesions of pelvic region: Secondary | ICD-10-CM | POA: Diagnosis not present

## 2021-12-12 DIAGNOSIS — M545 Low back pain, unspecified: Secondary | ICD-10-CM | POA: Insufficient documentation

## 2021-12-12 DIAGNOSIS — I714 Abdominal aortic aneurysm, without rupture, unspecified: Secondary | ICD-10-CM | POA: Diagnosis not present

## 2021-12-12 DIAGNOSIS — R6889 Other general symptoms and signs: Secondary | ICD-10-CM | POA: Diagnosis not present

## 2021-12-12 DIAGNOSIS — M899 Disorder of bone, unspecified: Secondary | ICD-10-CM | POA: Diagnosis not present

## 2021-12-12 DIAGNOSIS — M898X5 Other specified disorders of bone, thigh: Secondary | ICD-10-CM | POA: Insufficient documentation

## 2021-12-12 DIAGNOSIS — M549 Dorsalgia, unspecified: Secondary | ICD-10-CM | POA: Diagnosis not present

## 2021-12-12 DIAGNOSIS — N400 Enlarged prostate without lower urinary tract symptoms: Secondary | ICD-10-CM | POA: Diagnosis not present

## 2021-12-12 DIAGNOSIS — R102 Pelvic and perineal pain: Secondary | ICD-10-CM | POA: Diagnosis not present

## 2021-12-12 DIAGNOSIS — K5730 Diverticulosis of large intestine without perforation or abscess without bleeding: Secondary | ICD-10-CM | POA: Diagnosis not present

## 2021-12-12 DIAGNOSIS — N210 Calculus in bladder: Secondary | ICD-10-CM | POA: Diagnosis not present

## 2021-12-12 DIAGNOSIS — Z743 Need for continuous supervision: Secondary | ICD-10-CM | POA: Diagnosis not present

## 2021-12-12 LAB — URINALYSIS, ROUTINE W REFLEX MICROSCOPIC
Bilirubin Urine: NEGATIVE
Glucose, UA: NEGATIVE mg/dL
Hgb urine dipstick: NEGATIVE
Ketones, ur: 5 mg/dL — AB
Leukocytes,Ua: NEGATIVE
Nitrite: NEGATIVE
Protein, ur: NEGATIVE mg/dL
Specific Gravity, Urine: 1.027 (ref 1.005–1.030)
pH: 5 (ref 5.0–8.0)

## 2021-12-12 MED ORDER — LIDOCAINE 5 % EX PTCH
1.0000 | MEDICATED_PATCH | CUTANEOUS | Status: DC
  Administered 2021-12-12: 1 via TRANSDERMAL
  Filled 2021-12-12: qty 1

## 2021-12-12 MED ORDER — ACETAMINOPHEN 500 MG PO TABS
1000.0000 mg | ORAL_TABLET | ORAL | Status: AC
  Administered 2021-12-12: 1000 mg via ORAL
  Filled 2021-12-12: qty 2

## 2021-12-12 NOTE — ED Notes (Signed)
Pt verbalized understanding of d/c instructions, meds, and followup care. Denies questions. VSS, no distress noted. Assisted to wheelchair and into SUV with family.

## 2021-12-12 NOTE — ED Provider Notes (Signed)
[facility] EMERGENCY DEPARTMENT Provider Note   CSN: [id number] Arrival date & time: 12/12/21  1548     History {Add pertinent medical, surgical, social history, OB history to HPI:1} Chief Complaint  Patient presents with   Back Pain    Lee Martinez is a 86 y.o. male.  86 year old male with a history of Parkinson's presented to the emergency department with back pain.  History obtained by the patient and his 2 friends from church.  The patient states that he had atraumatic back pain that is in his right paraspinal region of the sacrum.  Says it is worsened with movement.  Describes it as sharp.  Says he been taking Aleve at home for it without significant improvement of his symptoms.  Did have a fall over 2 months ago but does not believe it was related to the pain that he is having today.  Denied any head strike or LOC with that fall.  No other injuries as a result.  He denies any bowel or bladder incontinence or weakness or numbness of his legs but does make ambulation more difficult due to the pain.  Says he has been compliant with his Parkinson's medications.  No recent fevers, flank pain, dysuria or frequency.  No changes in sensation when he wipes after having a bowel movement.   Back Pain      Home Medications Prior to Admission medications   Medication Sig Start Date End Date Taking? Authorizing Provider  calcium gluconate 500 MG tablet Take 1 tablet by mouth daily. Patient not taking: Reported on 11/08/2021    [provider]  carbidopa-levodopa (SINEMET IR) 25-100 MG tablet Take 2 tablets by mouth 3 (three) times daily. 10/02/21   Penumalli, Earlean Polka, MD  cholecalciferol (VITAMIN D) 1000 UNITS tablet Take 1,000 Units by mouth daily. Patient not taking: Reported on 11/08/2021    [provider]  fish oil-omega-3 fatty acids 1000 MG capsule Take 2 g by mouth daily. Patient not taking: Reported on 11/08/2021    [provider]   magnesium 30 MG tablet Take 30 mg by mouth daily. Patient not taking: Reported on 11/08/2021    [provider]  Multiple Vitamin (MULTIVITAMIN) tablet Take 1 tablet by mouth daily. Patient not taking: Reported on 11/08/2021    [provider]  saw palmetto 160 MG capsule Take 160 mg by mouth 2 (two) times daily. Patient not taking: Reported on 11/08/2021    [provider]  sertraline (ZOLOFT) 25 MG tablet Take 1 tablet (25 mg total) by mouth daily. 11/21/21 11/21/22  [name], [place]      Allergies    Patient has no known allergies.    Review of Systems   Review of Systems  Musculoskeletal:  Positive for back pain.    Physical Exam Updated Vital Signs BP 118/62   Pulse 88   Temp 98.9 F (37.2 C) (Oral)   Resp 16   Ht '5\' 11"'$  (1.803 m)   Wt 67.1 kg   SpO2 98%   BMI 20.64 kg/m  Physical Exam Vitals and nursing note reviewed.  Constitutional:      General: He is not in acute distress.    Appearance: Normal appearance. He is well-developed.  HENT:     Head: Normocephalic and atraumatic.     Right Ear: External ear normal.     Left Ear: External ear normal.     Nose: Nose normal.     Mouth/Throat:  Mouth: Mucous membranes are moist.     Pharynx: Oropharynx is clear.  Eyes:     Extraocular Movements: Extraocular movements intact.     Conjunctiva/sclera: Conjunctivae normal.     Pupils: Pupils are equal, round, and reactive to light.  Cardiovascular:     Rate and Rhythm: Normal rate and regular rhythm.  Pulmonary:     Effort: Pulmonary effort is normal. No respiratory distress.  Abdominal:     General: Abdomen is flat. There is no distension.     Palpations: Abdomen is soft.     Tenderness: There is no right CVA tenderness or left CVA tenderness.  Musculoskeletal:        General: No swelling.     Cervical back: Normal range of motion and neck supple.     Right lower leg: No edema.     Left lower leg: No edema.     Comments: No spinal  midline TTP in cervical, thoracic, or lumbar spine. No stepoffs noted.  Paraspinal tenderness to palpation to the right of the sacrum.  Motor: Muscle bulk and tone are normal. Strength is 5/5 in hip flexion, knee flexion and extension, ankle dorsiflexion and plantar flexion bilaterally. Full strength of great toe dorsiflexion bilaterally.  Sensory: Intact sensation to light touch in L5 though S1 dermatomes bilaterally.   Rectal Exam: Intact anal wink reflex. Good rectal tone   Skin:    General: Skin is warm and dry.     Capillary Refill: Capillary refill takes less than 2 seconds.  Neurological:     General: No focal deficit present.     Mental Status: He is alert. Mental status is at baseline.  Psychiatric:        Mood and Affect: Mood normal.        Behavior: Behavior normal.     ED Results / Procedures / Treatments   Labs (all labs ordered are listed, but only abnormal results are displayed) Labs Reviewed  URINALYSIS, ROUTINE W REFLEX MICROSCOPIC - Abnormal; Notable for the following components:      Result Value   APPearance HAZY (*)    Ketones, ur 5 (*)    All other components within normal limits    EKG None  Radiology No results found.  Procedures Procedures  {Document cardiac monitor, telemetry assessment procedure when appropriate:1}  Medications Ordered in ED Medications  lidocaine (LIDODERM) 5 % 1 patch (has no administration in time range)  acetaminophen (TYLENOL) tablet 1,000 mg (has no administration in time range)    ED Course/ Medical Decision Making/ A&P                           Medical Decision Making Amount and/or Complexity of Data Reviewed Labs: ordered. Radiology: ordered.  Risk OTC drugs. Prescription drug management.   ***  {Document critical care time when appropriate:1} {Document review of labs and clinical decision tools ie heart score, Chads2Vasc2 etc:1}  {Document your independent review of radiology images, and any outside  records:1} {Document your discussion with family members, caretakers, and with consultants:1} {Document social determinants of health affecting pt's care:1} {Document your decision making why or why not admission, treatments were needed:1} Final Clinical Impression(s) / ED Diagnoses Final diagnoses:  None    Rx / DC Orders ED Discharge Orders     None

## 2021-12-12 NOTE — ED Notes (Signed)
Pt transported to Xray via stretcher

## 2021-12-12 NOTE — Discharge Instructions (Addendum)
Today you were seen in the emergency department for your back pain.    In the emergency department you had x-rays and a CT scan which did not show any broken bones.   They did show several incidental findings to follow-up with your primary doctor: -Bone cysts in your right femur near the hip - Sclerotic lesion of the left posterior iliac bone (left pelvis) -Abdominal aortic aneurysm measuring 3.1 cm that will need follow-up  At home, please take Tylenol and lidocaine patches over-the-counter as needed for your pain.    Follow-up with your primary doctor in 2-3 days regarding your visit and to discuss your incidental findings.  Return immediately to the emergency department if you experience any of the following: Worsening pain, bowel or bladder incontinence, leg weakness or numbness, or any other concerning symptoms.    Thank you for visiting our Emergency Department. It was a pleasure taking care of you today.

## 2021-12-12 NOTE — ED Triage Notes (Signed)
Pt BIB [facility] from home after family called for back pain. Patient reported to EMS that the pain was in the sacral region and feels muscular. Patient currently laying on his side for comfort. VSS, NAD.

## 2021-12-12 NOTE — ED Triage Notes (Signed)
Pt reports a fall several weeks ago, unsure if that is when pain began. MD notified.

## 2021-12-14 ENCOUNTER — Telehealth: Payer: Self-pay

## 2021-12-14 NOTE — Telephone Encounter (Signed)
Transition Care Management Unsuccessful Follow-up Telephone Call  Date of discharge and from where:  12/12/2021 [facility]   Attempts:  1st Attempt  Reason for unsuccessful TCM follow-up call:  Left voice message

## 2021-12-19 DIAGNOSIS — F339 Major depressive disorder, recurrent, unspecified: Secondary | ICD-10-CM | POA: Diagnosis not present

## 2021-12-19 DIAGNOSIS — G20 Parkinson's disease: Secondary | ICD-10-CM | POA: Diagnosis not present

## 2021-12-19 DIAGNOSIS — F418 Other specified anxiety disorders: Secondary | ICD-10-CM | POA: Diagnosis not present

## 2021-12-19 DIAGNOSIS — F0630 Mood disorder due to known physiological condition, unspecified: Secondary | ICD-10-CM | POA: Diagnosis not present

## 2021-12-19 DIAGNOSIS — E875 Hyperkalemia: Secondary | ICD-10-CM | POA: Diagnosis not present

## 2021-12-19 DIAGNOSIS — E782 Mixed hyperlipidemia: Secondary | ICD-10-CM | POA: Diagnosis not present

## 2021-12-27 DIAGNOSIS — F339 Major depressive disorder, recurrent, unspecified: Secondary | ICD-10-CM | POA: Diagnosis not present

## 2021-12-27 DIAGNOSIS — G20 Parkinson's disease: Secondary | ICD-10-CM | POA: Diagnosis not present

## 2021-12-27 DIAGNOSIS — F0630 Mood disorder due to known physiological condition, unspecified: Secondary | ICD-10-CM | POA: Diagnosis not present

## 2021-12-27 DIAGNOSIS — F418 Other specified anxiety disorders: Secondary | ICD-10-CM | POA: Diagnosis not present

## 2021-12-27 DIAGNOSIS — E875 Hyperkalemia: Secondary | ICD-10-CM | POA: Diagnosis not present

## 2021-12-27 DIAGNOSIS — E782 Mixed hyperlipidemia: Secondary | ICD-10-CM | POA: Diagnosis not present

## 2021-12-31 DIAGNOSIS — E782 Mixed hyperlipidemia: Secondary | ICD-10-CM | POA: Diagnosis not present

## 2021-12-31 DIAGNOSIS — E875 Hyperkalemia: Secondary | ICD-10-CM | POA: Diagnosis not present

## 2021-12-31 DIAGNOSIS — F418 Other specified anxiety disorders: Secondary | ICD-10-CM | POA: Diagnosis not present

## 2021-12-31 DIAGNOSIS — F0630 Mood disorder due to known physiological condition, unspecified: Secondary | ICD-10-CM | POA: Diagnosis not present

## 2021-12-31 DIAGNOSIS — F339 Major depressive disorder, recurrent, unspecified: Secondary | ICD-10-CM | POA: Diagnosis not present

## 2021-12-31 DIAGNOSIS — G20 Parkinson's disease: Secondary | ICD-10-CM | POA: Diagnosis not present

## 2022-01-08 ENCOUNTER — Telehealth: Payer: Self-pay

## 2022-01-08 DIAGNOSIS — G20 Parkinson's disease: Secondary | ICD-10-CM | POA: Diagnosis not present

## 2022-01-08 DIAGNOSIS — F339 Major depressive disorder, recurrent, unspecified: Secondary | ICD-10-CM | POA: Diagnosis not present

## 2022-01-08 DIAGNOSIS — E875 Hyperkalemia: Secondary | ICD-10-CM | POA: Diagnosis not present

## 2022-01-08 DIAGNOSIS — F418 Other specified anxiety disorders: Secondary | ICD-10-CM | POA: Diagnosis not present

## 2022-01-08 DIAGNOSIS — F0630 Mood disorder due to known physiological condition, unspecified: Secondary | ICD-10-CM | POA: Diagnosis not present

## 2022-01-08 DIAGNOSIS — E782 Mixed hyperlipidemia: Secondary | ICD-10-CM | POA: Diagnosis not present

## 2022-01-08 NOTE — Telephone Encounter (Signed)
OT from [facility] called requesting a prescription for a bedside camode and walker for pt.  I have placed the order for both supplies for pt on parachute ok per provider. YL,RMA

## 2022-01-10 ENCOUNTER — Other Ambulatory Visit (INDEPENDENT_AMBULATORY_CARE_PROVIDER_SITE_OTHER): Payer: Medicare Other | Admitting: Nurse Practitioner

## 2022-01-10 DIAGNOSIS — E875 Hyperkalemia: Secondary | ICD-10-CM | POA: Diagnosis not present

## 2022-01-10 DIAGNOSIS — F3341 Major depressive disorder, recurrent, in partial remission: Secondary | ICD-10-CM

## 2022-01-10 DIAGNOSIS — F0630 Mood disorder due to known physiological condition, unspecified: Secondary | ICD-10-CM | POA: Diagnosis not present

## 2022-01-10 DIAGNOSIS — F418 Other specified anxiety disorders: Secondary | ICD-10-CM

## 2022-01-10 DIAGNOSIS — E782 Mixed hyperlipidemia: Secondary | ICD-10-CM | POA: Diagnosis not present

## 2022-01-10 DIAGNOSIS — G20 Parkinson's disease: Secondary | ICD-10-CM | POA: Diagnosis not present

## 2022-01-10 NOTE — Progress Notes (Signed)
Chief Complaint  Patient presents with   initial home health certification   Received home health orders orders from [facility]. Start of care 11/27/2021.   Certification and orders from 11/27/2021 through 01/25/2022 are reviewed, signed and faxed back to home health company.  Need of intermittent skilled services at home: SN, OT and PT  The home health care plan has been established by me and will be reviewed and updated as needed to maximize patient recovery.  I certify that all home health services have been and will be furnished to the patient while under my care.  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

## 2022-01-17 DIAGNOSIS — F418 Other specified anxiety disorders: Secondary | ICD-10-CM | POA: Diagnosis not present

## 2022-01-17 DIAGNOSIS — E782 Mixed hyperlipidemia: Secondary | ICD-10-CM | POA: Diagnosis not present

## 2022-01-17 DIAGNOSIS — F339 Major depressive disorder, recurrent, unspecified: Secondary | ICD-10-CM | POA: Diagnosis not present

## 2022-01-17 DIAGNOSIS — F0630 Mood disorder due to known physiological condition, unspecified: Secondary | ICD-10-CM | POA: Diagnosis not present

## 2022-01-17 DIAGNOSIS — G20 Parkinson's disease: Secondary | ICD-10-CM | POA: Diagnosis not present

## 2022-01-17 DIAGNOSIS — E875 Hyperkalemia: Secondary | ICD-10-CM | POA: Diagnosis not present

## 2022-01-24 DIAGNOSIS — G20 Parkinson's disease: Secondary | ICD-10-CM | POA: Diagnosis not present

## 2022-01-24 DIAGNOSIS — E875 Hyperkalemia: Secondary | ICD-10-CM | POA: Diagnosis not present

## 2022-01-24 DIAGNOSIS — F418 Other specified anxiety disorders: Secondary | ICD-10-CM | POA: Diagnosis not present

## 2022-01-24 DIAGNOSIS — F0630 Mood disorder due to known physiological condition, unspecified: Secondary | ICD-10-CM | POA: Diagnosis not present

## 2022-01-24 DIAGNOSIS — E782 Mixed hyperlipidemia: Secondary | ICD-10-CM | POA: Diagnosis not present

## 2022-01-24 DIAGNOSIS — F339 Major depressive disorder, recurrent, unspecified: Secondary | ICD-10-CM | POA: Diagnosis not present

## 2022-02-05 ENCOUNTER — Encounter: Payer: Self-pay | Admitting: Diagnostic Neuroimaging

## 2022-02-05 ENCOUNTER — Telehealth: Payer: Self-pay | Admitting: Diagnostic Neuroimaging

## 2022-02-05 ENCOUNTER — Ambulatory Visit (INDEPENDENT_AMBULATORY_CARE_PROVIDER_SITE_OTHER): Payer: Medicare Other | Admitting: Diagnostic Neuroimaging

## 2022-02-05 DIAGNOSIS — G20 Parkinson's disease: Secondary | ICD-10-CM

## 2022-02-05 DIAGNOSIS — F419 Anxiety disorder, unspecified: Secondary | ICD-10-CM | POA: Diagnosis not present

## 2022-02-05 NOTE — Telephone Encounter (Signed)
Pt's caregiver called pt refusing to come to appt. [place] asking if can be changed to a Telephone Visit. Discussed with neurologist nurse, she said it was ok to change this time a Telephone Visit. Added phone number [phone]

## 2022-02-05 NOTE — Progress Notes (Signed)
     Virtual Visit via Telephone Note  I connected with Lee Martinez on 02/05/22 at  2:00 PM EDT by telephone and verified that I am speaking with the correct person using two identifiers.   I discussed the limitations, risks, security and privacy concerns of performing an evaluation and management service by telephone and the availability of in person appointments. I also discussed with the patient that there may be a patient responsible charge related to this service. The patient expressed understanding and agreed to proceed. Patient is at home and I am at the office.    History of Present Illness:  UPDATE (02/05/22, VRP): Since last visit, doing about the same. Continues with tremors.   UPDATE (09/25/20, VRP): Since last visit, doing well. Symptoms are moderate. Severity is mild. No alleviating or aggravating factors. Tolerating meds.     UPDATE (04/26/20, VRP): Since last visit, PD is slightly progressing. Now taking carb/levo 25/100 5x per day. No alleviating or aggravating factors. Tolerating meds.     Observations/Objective:  Telephone visit   Assessment and Plan:  Dx:  1. Parkinson's disease ([place])   2. Anxiety     PARKINSONS DISEASE (advanced, with cognitive decline) - continue carbidopa-levodopa 25/100 --> increase to 3 tabs 3 times per day; needs supervision - caution with balance, walking and driving - needs 24 hour supervision; may need to consider placement options   DEPRESSION / ANXIETY - start sertrailine '25mg'$  daily    Follow Up Instructions:  - Return in about 1 year (around 02/06/2023).    I discussed the assessment and treatment plan with the patient. The patient was provided an opportunity to ask questions and all were answered. The patient agreed with the plan and demonstrated an understanding of the instructions.   The patient was advised to call back or seek an in-person evaluation if the symptoms worsen or if the condition fails to improve as  anticipated.  I provided 25 minutes of non-face-to-face time during this encounter.   Virtual Visit via Telephone Note  I connected with Lee Martinez  on 02/05/22 at  2:00 PM EDT by telephone and verified that I am speaking with the correct person using two identifiers.   I discussed the limitations, risks, security and privacy concerns of performing an evaluation and management service by telephone and the availability of in person appointments. I also discussed with the patient that there may be a patient responsible charge related to this service. The patient expressed understanding and agreed to proceed.   Patient is at home and I am at the office.   I spent 25 minutes of face-to-face and non-face-to-face time with patient.  This included previsit chart review, lab review, study review, order entry, electronic health record documentation, patient education.      Penni Bombard, MD 3/47/4259, 5:63 PM Certified in Neurology, Neurophysiology and Neuroimaging  [facility] [address] [place],  [address] [phone]

## 2022-02-07 ENCOUNTER — Ambulatory Visit (INDEPENDENT_AMBULATORY_CARE_PROVIDER_SITE_OTHER): Payer: Medicare Other

## 2022-02-07 ENCOUNTER — Ambulatory Visit: Payer: Medicare Other

## 2022-02-07 ENCOUNTER — Encounter: Payer: Self-pay | Admitting: Nurse Practitioner

## 2022-02-07 ENCOUNTER — Ambulatory Visit (INDEPENDENT_AMBULATORY_CARE_PROVIDER_SITE_OTHER): Payer: Medicare Other | Admitting: Nurse Practitioner

## 2022-02-07 VITALS — BP 90/60 | HR 98 | Temp 98.0°F | Wt 138.0 lb

## 2022-02-07 VITALS — BP 90/60 | HR 98 | Temp 98.5°F | Ht 71.0 in | Wt 138.0 lb

## 2022-02-07 DIAGNOSIS — Z0000 Encounter for general adult medical examination without abnormal findings: Secondary | ICD-10-CM | POA: Diagnosis not present

## 2022-02-07 DIAGNOSIS — N400 Enlarged prostate without lower urinary tract symptoms: Secondary | ICD-10-CM

## 2022-02-07 DIAGNOSIS — G20A1 Parkinson's disease without dyskinesia, without mention of fluctuations: Secondary | ICD-10-CM

## 2022-02-07 DIAGNOSIS — F3341 Major depressive disorder, recurrent, in partial remission: Secondary | ICD-10-CM | POA: Diagnosis not present

## 2022-02-07 DIAGNOSIS — N210 Calculus in bladder: Secondary | ICD-10-CM | POA: Diagnosis not present

## 2022-02-07 DIAGNOSIS — G20 Parkinson's disease: Secondary | ICD-10-CM

## 2022-02-07 DIAGNOSIS — R21 Rash and other nonspecific skin eruption: Secondary | ICD-10-CM | POA: Diagnosis not present

## 2022-02-07 DIAGNOSIS — I714 Abdominal aortic aneurysm, without rupture, unspecified: Secondary | ICD-10-CM

## 2022-02-07 DIAGNOSIS — E782 Mixed hyperlipidemia: Secondary | ICD-10-CM

## 2022-02-07 DIAGNOSIS — R935 Abnormal findings on diagnostic imaging of other abdominal regions, including retroperitoneum: Secondary | ICD-10-CM | POA: Diagnosis not present

## 2022-02-07 DIAGNOSIS — E875 Hyperkalemia: Secondary | ICD-10-CM

## 2022-02-07 DIAGNOSIS — I700 Atherosclerosis of aorta: Secondary | ICD-10-CM | POA: Diagnosis not present

## 2022-02-07 MED ORDER — SERTRALINE HCL 50 MG PO TABS: 50.0000 mg | ORAL_TABLET | Freq: Every day | ORAL | 1 refills | Status: AC

## 2022-02-07 MED ORDER — ATORVASTATIN CALCIUM 10 MG PO TABS: ORAL_TABLET | ORAL | 1 refills | Status: DC

## 2022-02-07 NOTE — Patient Instructions (Addendum)
Hypertension, Adult High blood pressure (hypertension) is when the force of blood pumping through the arteries is too strong. The arteries are the blood vessels that carry blood from the heart throughout the body. Hypertension forces the heart to work harder to pump blood and may cause arteries to become narrow or stiff. Untreated or uncontrolled hypertension can lead to a heart attack, heart failure, a stroke, kidney disease, and other problems. A blood pressure reading consists of a higher number over a lower number. Ideally, your blood pressure should be below 120/80. The first ("top") number is called the systolic pressure. It is a measure of the pressure in your arteries as your heart beats. The second ("bottom") number is called the diastolic pressure. It is a measure of the pressure in your arteries as the heart relaxes. What are the causes? The exact cause of this condition is not known. There are some conditions that result in high blood pressure. What increases the risk? Certain factors may make you more likely to develop high blood pressure. Some of these risk factors are under your control, including: Smoking. Not getting enough exercise or physical activity. Being overweight. Having too much fat, sugar, calories, or salt (sodium) in your diet. Drinking too much alcohol. Other risk factors include: Having a personal history of heart disease, diabetes, high cholesterol, or kidney disease. Stress. Having a family history of high blood pressure and high cholesterol. Having obstructive sleep apnea. Age. The risk increases with age. What are the signs or symptoms? High blood pressure may not cause symptoms. Very high blood pressure (hypertensive crisis) may cause: Headache. Fast or irregular heartbeats (palpitations). Shortness of breath. Nosebleed. Nausea and vomiting. Vision changes. Severe chest pain, dizziness, and seizures. How is this diagnosed? This condition is diagnosed by  measuring your blood pressure while you are seated, with your arm resting on a flat surface, your legs uncrossed, and your feet flat on the floor. The cuff of the blood pressure monitor will be placed directly against the skin of your upper arm at the level of your heart. Blood pressure should be measured at least twice using the same arm. Certain conditions can cause a difference in blood pressure between your right and left arms. If you have a high blood pressure reading during one visit or you have normal blood pressure with other risk factors, you may be asked to: Return on a different day to have your blood pressure checked again. Monitor your blood pressure at home for 1 week or longer. If you are diagnosed with hypertension, you may have other blood or imaging tests to help your health care provider understand your overall risk for other conditions. How is this treated? This condition is treated by making healthy lifestyle changes, such as eating healthy foods, exercising more, and reducing your alcohol intake. You may be referred for counseling on a healthy diet and physical activity. Your health care provider may prescribe medicine if lifestyle changes are not enough to get your blood pressure under control and if: Your systolic blood pressure is above 130. Your diastolic blood pressure is above 80. Your personal target blood pressure may vary depending on your medical conditions, your age, and other factors. Follow these instructions at home: Eating and drinking  Eat a diet that is high in fiber and potassium, and low in sodium, added sugar, and fat. An example of this eating plan is called the DASH diet. DASH stands for Dietary Approaches to Stop Hypertension. To eat this way: Eat   plenty of fresh fruits and vegetables. Try to fill one half of your plate at each meal with fruits and vegetables. Eat whole grains, such as whole-wheat pasta, brown rice, or whole-grain bread. Fill about one  fourth of your plate with whole grains. Eat or drink low-fat dairy products, such as skim milk or low-fat yogurt. Avoid fatty cuts of meat, processed or cured meats, and poultry with skin. Fill about one fourth of your plate with lean proteins, such as fish, chicken without skin, beans, eggs, or tofu. Avoid pre-made and processed foods. These tend to be higher in sodium, added sugar, and fat. Reduce your daily sodium intake. Many people with hypertension should eat less than 1,500 mg of sodium a day. Do not drink alcohol if: Your health care provider tells you not to drink. You are pregnant, may be pregnant, or are planning to become pregnant. If you drink alcohol: Limit how much you have to: 0-1 drink a day for women. 0-2 drinks a day for men. Know how much alcohol is in your drink. In the [place] one drink equals one 12 oz bottle of beer (355 mL), one 5 oz glass of wine (148 mL), or one 1 oz glass of hard liquor (44 mL). Lifestyle  Work with your health care provider to maintain a healthy body weight or to lose weight. Ask what an ideal weight is for you. Get at least 30 minutes of exercise that causes your heart to beat faster (aerobic exercise) most days of the week. Activities may include walking, swimming, or biking. Include exercise to strengthen your muscles (resistance exercise), such as Pilates or lifting weights, as part of your weekly exercise routine. Try to do these types of exercises for 30 minutes at least 3 days a week. Do not use any products that contain nicotine or tobacco. These products include cigarettes, chewing tobacco, and vaping devices, such as e-cigarettes. If you need help quitting, ask your health care provider. Monitor your blood pressure at home as told by your health care provider. Keep all follow-up visits. This is important. Medicines Take over-the-counter and prescription medicines only as told by your health care provider. Follow directions carefully. Blood  pressure medicines must be taken as prescribed. Do not skip doses of blood pressure medicine. Doing this puts you at risk for problems and can make the medicine less effective. Ask your health care provider about side effects or reactions to medicines that you should watch for. Contact a health care provider if you: Think you are having a reaction to a medicine you are taking. Have headaches that keep coming back (recurring). Feel dizzy. Have swelling in your ankles. Have trouble with your vision. Get help right away if you: Develop a severe headache or confusion. Have unusual weakness or numbness. Feel faint. Have severe pain in your chest or abdomen. Vomit repeatedly. Have trouble breathing. These symptoms may be an emergency. Get help right away. Call 911. Do not wait to see if the symptoms will go away. Do not drive yourself to the hospital. Summary Hypertension is when the force of blood pumping through your arteries is too strong. If this condition is not controlled, it may put you at risk for serious complications. Your personal target blood pressure may vary depending on your medical conditions, your age, and other factors. For most people, a normal blood pressure is less than 120/80. Hypertension is treated with lifestyle changes, medicines, or a combination of both. Lifestyle changes include losing weight, eating a healthy,   low-sodium diet, exercising more, and limiting alcohol. This information is not intended to replace advice given to you by your health care provider. Make sure you discuss any questions you have with your health care provider. Document Revised: 03/06/2021 Document Reviewed: 03/06/2021 Elsevier Patient Education  [facility] - call them to get information about placement Address: [address] [place], [place] [address] Phone: [phone]

## 2022-02-07 NOTE — Progress Notes (Signed)
Subjective:   Lee Martinez is a 86 y.o. male who presents for Medicare Annual/Subsequent preventive examination.  Review of Systems     Cardiac Risk Factors include: advanced age (>12mn, >>42women)     Objective:    Today's Vitals   02/07/22 1539  BP: 90/60  Pulse: 98  Temp: 98 F (36.7 C)  TempSrc: Oral  Weight: 138 lb (62.6 kg)   Body mass index is 19.25 kg/m.     02/07/2022    3:49 PM 01/04/2021    3:34 PM 04/12/2020    3:01 PM 04/06/2019    2:26 PM 05/21/2018    2:10 PM 04/10/2016    2:34 PM 12/12/2014    1:00 PM  Advanced Directives  Does Patient Have a Medical Advance Directive? Yes Yes Yes Yes Yes  Yes  Type of AParamed[place]of [place] will [place] will [place] will [place] will Living will  Living will;Healthcare Power of Attorney  Does patient want to make changes to medical advance directive?     No - Patient declined    Copy of [place] Chart? Yes - validated most recent copy scanned in chart (See row information) Yes - validated most recent copy scanned in chart (See row information) Yes - validated most recent copy scanned in chart (See row information) Yes - validated most recent copy scanned in chart (See row information)   No - copy requested  Would patient like information on creating a medical advance directive?            Information is confidential and restricted. Go to Review Flowsheets to unlock data.    Current Medications (verified) Outpatient Encounter Medications as of 02/07/2022  Medication Sig   carbidopa-levodopa (SINEMET IR) 25-100 MG tablet Take 2 tablets by mouth 3 (three) times daily.   sertraline (ZOLOFT) 25 MG tablet Take 1 tablet (25 mg total) by mouth daily. (Patient taking differently: Take 50 mg by mouth daily.)   calcium gluconate 500 MG tablet Take 1 tablet by mouth daily. (Patient not taking:  Reported on 11/08/2021)   cholecalciferol (VITAMIN D) 1000 UNITS tablet Take 1,000 Units by mouth daily. (Patient not taking: Reported on 11/08/2021)   fish oil-omega-3 fatty acids 1000 MG capsule Take 2 g by mouth daily. (Patient not taking: Reported on 11/08/2021)   magnesium 30 MG tablet Take 30 mg by mouth daily. (Patient not taking: Reported on 11/08/2021)   Multiple Vitamin (MULTIVITAMIN) tablet Take 1 tablet by mouth daily. (Patient not taking: Reported on 11/08/2021)   saw palmetto 160 MG capsule Take 160 mg by mouth 2 (two) times daily. (Patient not taking: Reported on 11/08/2021)   No facility-administered encounter medications on file as of 02/07/2022.    Allergies (verified) Patient has no known allergies.   History: Past Medical History:  Diagnosis Date   Parkinson's disease ([place] 07/2011   [facility]   Past Surgical History:  Procedure Laterality Date   basil removal     HERNIA REPAIR     THYROID SURGERY     Family History  Problem Relation Age of Onset   Heart failure Mother    Breast cancer Sister    Social History   Socioeconomic History   Marital status: Widowed    Spouse name: SRoanna Epley  Number of children: 0   Years of education: 12   Highest education level: Not on file  Occupational History   Occupation: retired    Fish farm manager: RETIRED  Tobacco Use   Smoking status: Never   Smokeless tobacco: Never  Vaping Use   Vaping Use: Never used  Substance and Sexual Activity   Alcohol use: Not Currently    Alcohol/week: 1.0 standard drink of alcohol    Types: 1 Shots of liquor per week   Drug use: No   Sexual activity: Not Currently    Partners: Female  Other Topics Concern   Not on file  Social History Narrative   04/26/20 Lives alone, wife passed this year   Social Determinants of Health   Financial Resource Strain: Low Risk  (02/07/2022)   Overall Financial Resource Strain (CARDIA)    Difficulty of Paying Living Expenses: Not hard at  all  Food Insecurity: No Food Insecurity (02/07/2022)   Hunger Vital Sign    Worried About Running Out of Food in the Last Year: Never true    [place] in the Last Year: Never true  Transportation Needs: No Transportation Needs (02/07/2022)   PRAPARE - Hydrologist (Medical): No    Lack of Transportation (Non-Medical): No  Physical Activity: Inactive (02/07/2022)   Exercise Vital Sign    Days of Exercise per Week: 0 days    Minutes of Exercise per Session: 0 min  Stress: No Stress Concern Present (02/07/2022)   [place]    Feeling of Stress : Not at all  Social Connections: Not on file    Tobacco Counseling Counseling given: Not Answered   Clinical Intake:  Pre-visit preparation completed: Yes  Pain : No/denies pain     Nutritional Status: BMI of 19-24  Normal Nutritional Risks: None Diabetes: No  How often do you need to have someone help you when you read instructions, pamphlets, or other written materials from your doctor or pharmacy?: 1 - Never What is the last grade level you completed in school?: 12th grade  Diabetic? no  Interpreter Needed?: No  Information entered by :: NAllen LPN   Activities of Daily Living    02/07/2022    3:52 PM  In your present state of health, do you have any difficulty performing the following activities:  Hearing? 0  Vision? 0  Difficulty concentrating or making decisions? 1  Dressing or bathing? 1  Doing errands, shopping? 1  Preparing Food and eating ? Y  Using the Toilet? N  In the past six months, have you accidently leaked urine? Y  Do you have problems with loss of bowel control? N  Managing your Medications? Y  Managing your Finances? Y  Housekeeping or managing your Housekeeping? Y    Patient Care Team: [name], FNP as PCP - General (General Practice)  Indicate any recent Medical Services you may have  received from other than [facility] providers in the past year (date may be approximate).     Assessment:   This is a routine wellness examination for Lee Martinez.  Hearing/Vision screen Vision Screening - Comments:: Regular eye exams,   Dietary issues and exercise activities discussed: Current Exercise Habits: The patient does not participate in regular exercise at present   Goals Addressed             This Visit's Progress    Patient Stated       02/07/2022, no goals       Depression Screen    02/07/2022  3:51 PM 11/08/2021    3:13 PM 01/04/2021    3:35 PM 10/26/2020    2:15 PM 04/12/2020    3:02 PM 04/12/2020    2:19 PM 04/06/2019    2:27 PM  PHQ 2/9 Scores  PHQ - 2 Score 0 0 0 0  0 0  PHQ- 9 Score    0  0 0  Exception Documentation     Other- indicate reason in comment box    Not completed     just completed by CMA      Fall Risk    02/07/2022    3:50 PM 11/08/2021    3:12 PM 01/04/2021    3:35 PM 09/25/2020   12:59 PM 04/26/2020    3:07 PM  [place] in the past year? 0 1 0 0 0  Comment loses balance      Number falls in past yr: 0 1     Injury with Fall? 0 0     Risk for fall due to : Impaired balance/gait;Impaired mobility;Medication side effect History of fall(s);Impaired balance/gait;Impaired vision Medication side effect    Follow up Falls evaluation completed;Education provided;Falls prevention discussed Falls evaluation completed Falls evaluation completed;Education provided;Falls prevention discussed      FALL RISK PREVENTION PERTAINING TO THE HOME:  Any stairs in or around the home? Yes  If so, are there any without handrails? No  Home free of loose throw rugs in walkways, pet beds, electrical cords, etc? Yes  Adequate lighting in your home to reduce risk of falls? Yes   ASSISTIVE DEVICES UTILIZED TO PREVENT FALLS:  Life alert? No  Use of a cane, walker or w/c? Yes  Grab bars in the bathroom? Yes  Shower chair or bench in shower? Yes  Elevated  toilet seat or a handicapped toilet? Yes   TIMED UP AND GO:  Was the test performed? Yes .  Length of time to ambulate 10 feet: 7 sec.   Gait slow and steady with assistive device  Cognitive Function:        02/07/2022    3:53 PM 01/04/2021    3:37 PM 04/12/2020    3:03 PM 04/06/2019    2:28 PM  6CIT Screen  What Year? 4 points 0 points 0 points 0 points  What month? 0 points 0 points 0 points 0 points  What time? 0 points 0 points 0 points 0 points  Count back from 20 0 points 2 points 2 points 0 points  Months in reverse 4 points 4 points 2 points 2 points  Repeat phrase 8 points 4 points 6 points 6 points  Total Score 16 points 10 points 10 points 8 points    Immunizations Immunization History  Administered Date(s) Administered   Janssen (J&J) SARS-COV-2 Vaccination 08/20/2019   Moderna SARS-COV2 Booster Vaccination 04/21/2020    TDAP status: Due, Education has been provided regarding the importance of this vaccine. Advised may receive this vaccine at local pharmacy or Health Dept. Aware to provide a copy of the vaccination record if obtained from local pharmacy or Health Dept. Verbalized acceptance and understanding.  Flu Vaccine status: Declined, Education has been provided regarding the importance of this vaccine but patient still declined. Advised may receive this vaccine at local pharmacy or Health Dept. Aware to provide a copy of the vaccination record if obtained from local pharmacy or Health Dept. Verbalized acceptance and understanding.  Pneumococcal vaccine status: Declined,  Education has been provided regarding the  importance of this vaccine but patient still declined. Advised may receive this vaccine at local pharmacy or Health Dept. Aware to provide a copy of the vaccination record if obtained from local pharmacy or Health Dept. Verbalized acceptance and understanding.   Covid-19 vaccine status: Completed vaccines  Qualifies for Shingles Vaccine? Yes    Zostavax completed No   Shingrix Completed?: No.    Education has been provided regarding the importance of this vaccine. Patient has been advised to call insurance company to determine out of pocket expense if they have not yet received this vaccine. Advised may also receive vaccine at local pharmacy or Health Dept. Verbalized acceptance and understanding.  Screening Tests Health Maintenance  Topic Date Due   COVID-19 Vaccine (2 - Booster for Janssen series) 06/16/2020   Zoster Vaccines- Shingrix (1 of 2) 05/09/2022 (Originally 03/19/1986)   INFLUENZA VACCINE  08/11/2022 (Originally 12/11/2021)   Pneumonia Vaccine 65+ Years old (1 - PCV) 11/09/2022 (Originally 03/20/2003)   TETANUS/TDAP  11/09/2022 (Originally 09/26/2020)   HPV VACCINES  Aged Out    Health Maintenance  Health Maintenance Due  Topic Date Due   COVID-19 Vaccine (2 - Booster for Janssen series) 06/16/2020    Colorectal cancer screening: No longer required.   Lung Cancer Screening: (Low Dose CT Chest recommended if Age 1-80 years, 30 pack-year currently smoking OR have quit w/in 15years.) does not qualify.   Lung Cancer Screening Referral: no  Additional Screening:  Hepatitis C Screening: does not qualify;   Vision Screening: Recommended annual ophthalmology exams for early detection of glaucoma and other disorders of the eye. Is the patient up to date with their annual eye exam?  No  Who is the provider or what is the name of the office in which the patient attends annual eye exams? Can't remember name If pt is not established with a provider, would they like to be referred to a provider to establish care? No .   Dental Screening: Recommended annual dental exams for proper oral hygiene  Community Resource Referral / Chronic Care Management: CRR required this visit?  No   CCM required this visit?  No      Plan:     I have personally reviewed and noted the following in the patient's chart:   Medical and  social history Use of alcohol, tobacco or illicit drugs  Current medications and supplements including opioid prescriptions. Patient is not currently taking opioid prescriptions. Functional ability and status Nutritional status Physical activity Advanced directives List of other physicians Hospitalizations, surgeries, and ER visits in previous 12 months Vitals Screenings to include cognitive, depression, and falls Referrals and appointments  In addition, I have reviewed and discussed with patient certain preventive protocols, quality metrics, and best practice recommendations. A written personalized care plan for preventive services as well as general preventive health recommendations were provided to patient.     Kellie Simmering, LPN   11/06/9483   Nurse Notes: none

## 2022-02-07 NOTE — Progress Notes (Signed)
Subjective:     Patient ID: Lee Martinez , male    DOB: 06-Jun-1935 , 86 y.o.   MRN: [id number]   Chief Complaint  Patient presents with   Hyperlipidemia    HPI  Here for follow up. Also had AWV. He seen the Neurologist earlier this week will be taking 3 tabs of carbidopa - levodopa. He continues to stay at home. Working on either more supervision or change location. He has had 2 falls since his last visit. Once he was opening dresser and in the bathroom. He does not have life alert.      Past Medical History:  Diagnosis Date   Parkinson's disease ([place]) 07/2011   followed by [facility]     Family History  Problem Relation Age of Onset   Heart failure Mother    Breast cancer Sister      Current Outpatient Medications:    atorvastatin (LIPITOR) 10 MG tablet, Take 1 tab daily in evening MWF, Disp: 45 tablet, Rfl: 1   calcium gluconate 500 MG tablet, Take 1 tablet by mouth daily. (Patient not taking: Reported on 11/08/2021), Disp: , Rfl:    carbidopa-levodopa (SINEMET IR) 25-100 MG tablet, Take 2 tablets by mouth 3 (three) times daily., Disp: , Rfl:    cholecalciferol (VITAMIN D) 1000 UNITS tablet, Take 1,000 Units by mouth daily. (Patient not taking: Reported on 11/08/2021), Disp: , Rfl:    fish oil-omega-3 fatty acids 1000 MG capsule, Take 2 g by mouth daily. (Patient not taking: Reported on 11/08/2021), Disp: , Rfl:    magnesium 30 MG tablet, Take 30 mg by mouth daily. (Patient not taking: Reported on 11/08/2021), Disp: , Rfl:    Multiple Vitamin (MULTIVITAMIN) tablet, Take 1 tablet by mouth daily. (Patient not taking: Reported on 11/08/2021), Disp: , Rfl:    saw palmetto 160 MG capsule, Take 160 mg by mouth 2 (two) times daily. (Patient not taking: Reported on 11/08/2021), Disp: , Rfl:    sertraline (ZOLOFT) 50 MG tablet, Take 1 tablet (50 mg total) by mouth daily., Disp: 90 tablet, Rfl: 1   No Known Allergies   Review of Systems   Today's Vitals   02/07/22 1607  BP:  90/60  Pulse: 98  Temp: 98.5 F (36.9 C)  TempSrc: Oral  SpO2: 95%  Weight: 138 lb (62.6 kg)  Height: 5' 11"  (1.803 m)   Body mass index is 19.25 kg/m.   Objective:  Physical Exam Vitals reviewed.  Constitutional:      General: He is not in acute distress.    Appearance: Normal appearance.  HENT:     Head: Normocephalic and atraumatic.  Eyes:     Extraocular Movements: Extraocular movements intact.     Pupils: Pupils are equal, round, and reactive to light.     Comments: He lays with his eyes closed throughout most of the visit.   Cardiovascular:     Rate and Rhythm: Normal rate and regular rhythm.     Pulses: Normal pulses.     Heart sounds: Normal heart sounds. No murmur heard. Pulmonary:     Effort: Pulmonary effort is normal. No respiratory distress.     Breath sounds: Normal breath sounds. No wheezing.  Abdominal:     General: There is no distension.  Genitourinary:    Prostate: Normal.     Rectum: Guaiac result negative.  Musculoskeletal:        General: Normal range of motion.  Skin:    General:  Skin is warm.     Capillary Refill: Capillary refill takes less than 2 seconds.     Comments: Has 2 growths to posterior right leg  Neurological:     General: No focal deficit present.     Mental Status: He is alert and oriented to person, place, and time.     Cranial Nerves: No cranial nerve deficit.     Motor: No weakness.     Comments: He has significant bilateral hand tremors, worse today than I have seen him in the past  Psychiatric:        Mood and Affect: Mood normal.        Behavior: Behavior normal.        Thought Content: Thought content normal.        Judgment: Judgment normal.         Assessment And Plan:     1. Mixed hyperlipidemia Comments: Cholesterol levels were improved at last visit however his CT scan showed he had aortic atherosclerosis, will start atorvastatin  2. Parkinson's disease ([place]) Comments: Neurology increased dose of  carbidopa/levodopa. He has also recommended to find a location for patient to live that provides 24 hour care. Family friend reports they are actively searching for placement. Social work will call them to provide a list of facilities. Also recommended he get a life alert per MSW - there is a fee due to his insurance  3. Recurrent major depressive disorder, in partial remission (HCC) Comments: Continue sertraline, doing well. Depression screen score is 6 - sertraline (ZOLOFT) 50 MG tablet; Take 1 tablet (50 mg total) by mouth daily.  Dispense: 90 tablet; Refill: 1  4. Serum potassium elevated Comments: Will recheck levels - BMP8+eGFR  5. Enlarged prostate Comments: Seen on CT scan pelvis, will refer to urology and ordered a PSA - Ambulatory referral to Urology - PSA  6. Abnormal CT of the abdomen - Multiple Myeloma Panel (SPEP&IFE w/QIG)  7. Skin eruption Comments: Has 2 abnormal shaped growths to posterior right leg, will refer to Dermatology - Ambulatory referral to Dermatology  8. Aortic atherosclerosis ([place]) Comments: Will start statin, discussed with his POA - atorvastatin (LIPITOR) 10 MG tablet; Take 1 tab daily in evening MWF  Dispense: 45 tablet; Refill: 1  9. Abdominal aortic aneurysm (AAA) without rupture, unspecified part ([place]) Comments: Seen on CT scan measures 3.1 cm, recommended to repeat in 3 years  10. Bladder calculi Comments: Seen on CT scan, will refer to urology - Ambulatory referral to Urology     Patient was given opportunity to ask questions. Patient verbalized understanding of the plan and was able to repeat key elements of the plan. All questions were answered to their satisfaction.  [name], FNP   I, [name], FNP, have reviewed all documentation for this visit. The documentation on 02/08/22 for the exam, diagnosis, procedures, and orders are all accurate and complete.   IF YOU HAVE BEEN REFERRED TO A SPECIALIST, IT MAY TAKE 1-2 WEEKS TO  SCHEDULE/PROCESS THE REFERRAL. IF YOU HAVE NOT HEARD FROM US/SPECIALIST IN TWO WEEKS, PLEASE GIVE [place] A CALL AT [phone].   THE PATIENT IS ENCOURAGED TO PRACTICE SOCIAL DISTANCING DUE TO THE COVID-19 PANDEMIC.

## 2022-02-07 NOTE — Patient Instructions (Signed)
[name] , Thank you for taking time to come for your Medicare Wellness Visit. I appreciate your ongoing commitment to your health goals. Please review the following plan we discussed and let me know if I can assist you in the future.   Screening recommendations/referrals: Colonoscopy: not required Recommended yearly ophthalmology/optometry visit for glaucoma screening and checkup Recommended yearly dental visit for hygiene and checkup  Vaccinations: Influenza vaccine: decline Pneumococcal vaccine: decline Tdap vaccine: decline Shingles vaccine: decline   Covid-19: 04/21/2020, 08/20/2019  Advanced directives: copy in chart  Conditions/risks identified: none  Next appointment: Follow up in one year for your annual wellness visit.   Preventive Care 84 Years and Older, Male Preventive care refers to lifestyle choices and visits with your health care provider that can promote health and wellness. What does preventive care include? A yearly physical exam. This is also called an annual well check. Dental exams once or twice a year. Routine eye exams. Ask your health care provider how often you should have your eyes checked. Personal lifestyle choices, including: Daily care of your teeth and gums. Regular physical activity. Eating a healthy diet. Avoiding tobacco and drug use. Limiting alcohol use. Practicing safe sex. Taking low doses of aspirin every day. Taking vitamin and mineral supplements as recommended by your health care provider. What happens during an annual well check? The services and screenings done by your health care provider during your annual well check will depend on your age, overall health, lifestyle risk factors, and family history of disease. Counseling  Your health care provider may ask you questions about your: Alcohol use. Tobacco use. Drug use. Emotional well-being. Home and relationship well-being. Sexual activity. Eating habits. History of  falls. Memory and ability to understand (cognition). Work and work Statistician. Screening  You may have the following tests or measurements: Height, weight, and BMI. Blood pressure. Lipid and cholesterol levels. These may be checked every 5 years, or more frequently if you are over 84 years old. Skin check. Lung cancer screening. You may have this screening every year starting at age 84 if you have a 30-pack-year history of smoking and currently smoke or have quit within the past 15 years. Fecal occult blood test (FOBT) of the stool. You may have this test every year starting at age 84. Flexible sigmoidoscopy or colonoscopy. You may have a sigmoidoscopy every 5 years or a colonoscopy every 10 years starting at age 84. Prostate cancer screening. Recommendations will vary depending on your family history and other risks. Hepatitis C blood test. Hepatitis B blood test. Sexually transmitted disease (STD) testing. Diabetes screening. This is done by checking your blood sugar (glucose) after you have not eaten for a while (fasting). You may have this done every 1-3 years. Abdominal aortic aneurysm (AAA) screening. You may need this if you are a current or former smoker. Osteoporosis. You may be screened starting at age 84 if you are at high risk. Talk with your health care provider about your test results, treatment options, and if necessary, the need for more tests. Vaccines  Your health care provider may recommend certain vaccines, such as: Influenza vaccine. This is recommended every year. Tetanus, diphtheria, and acellular pertussis (Tdap, Td) vaccine. You may need a Td booster every 10 years. Zoster vaccine. You may need this after age 59. Pneumococcal 13-valent conjugate (PCV13) vaccine. One dose is recommended after age 57. Pneumococcal polysaccharide (PPSV23) vaccine. One dose is recommended after age 43. Talk to your health care provider about which  screenings and vaccines you need and  how often you need them. This information is not intended to replace advice given to you by your health care provider. Make sure you discuss any questions you have with your health care provider. Document Released: 05/26/2015 Document Revised: 01/17/2016 Document Reviewed: 02/28/2015 Elsevier Interactive Patient Education  2017 [place] Prevention in the Home Falls can cause injuries. They can happen to people of all ages. There are many things you can do to make your home safe and to help prevent falls. What can I do on the outside of my home? Regularly fix the edges of walkways and driveways and fix any cracks. Remove anything that might make you trip as you walk through a door, such as a raised step or threshold. Trim any bushes or trees on the path to your home. Use bright outdoor lighting. Clear any walking paths of anything that might make someone trip, such as rocks or tools. Regularly check to see if handrails are loose or broken. Make sure that both sides of any steps have handrails. Any raised decks and porches should have guardrails on the edges. Have any leaves, snow, or ice cleared regularly. Use sand or salt on walking paths during winter. Clean up any spills in your garage right away. This includes oil or grease spills. What can I do in the bathroom? Use night lights. Install grab bars by the toilet and in the tub and shower. Do not use towel bars as grab bars. Use non-skid mats or decals in the tub or shower. If you need to sit down in the shower, use a plastic, non-slip stool. Keep the floor dry. Clean up any water that spills on the floor as soon as it happens. Remove soap buildup in the tub or shower regularly. Attach bath mats securely with double-sided non-slip rug tape. Do not have throw rugs and other things on the floor that can make you trip. What can I do in the bedroom? Use night lights. Make sure that you have a light by your bed that is easy to  reach. Do not use any sheets or blankets that are too big for your bed. They should not hang down onto the floor. Have a firm chair that has side arms. You can use this for support while you get dressed. Do not have throw rugs and other things on the floor that can make you trip. What can I do in the kitchen? Clean up any spills right away. Avoid walking on wet floors. Keep items that you use a lot in easy-to-reach places. If you need to reach something above you, use a strong step stool that has a grab bar. Keep electrical cords out of the way. Do not use floor polish or wax that makes floors slippery. If you must use wax, use non-skid floor wax. Do not have throw rugs and other things on the floor that can make you trip. What can I do with my stairs? Do not leave any items on the stairs. Make sure that there are handrails on both sides of the stairs and use them. Fix handrails that are broken or loose. Make sure that handrails are as long as the stairways. Check any carpeting to make sure that it is firmly attached to the stairs. Fix any carpet that is loose or worn. Avoid having throw rugs at the top or bottom of the stairs. If you do have throw rugs, attach them to the floor with carpet  tape. Make sure that you have a light switch at the top of the stairs and the bottom of the stairs. If you do not have them, ask someone to add them for you. What else can I do to help prevent falls? Wear shoes that: Do not have high heels. Have rubber bottoms. Are comfortable and fit you well. Are closed at the toe. Do not wear sandals. If you use a stepladder: Make sure that it is fully opened. Do not climb a closed stepladder. Make sure that both sides of the stepladder are locked into place. Ask someone to hold it for you, if possible. Clearly mark and make sure that you can see: Any grab bars or handrails. First and last steps. Where the edge of each step is. Use tools that help you move  around (mobility aids) if they are needed. These include: Canes. Walkers. Scooters. Crutches. Turn on the lights when you go into a dark area. Replace any light bulbs as soon as they burn out. Set up your furniture so you have a clear path. Avoid moving your furniture around. If any of your floors are uneven, fix them. If there are any pets around you, be aware of where they are. Review your medicines with your doctor. Some medicines can make you feel dizzy. This can increase your chance of falling. Ask your doctor what other things that you can do to help prevent falls. This information is not intended to replace advice given to you by your health care provider. Make sure you discuss any questions you have with your health care provider. Document Released: 02/23/2009 Document Revised: 10/05/2015 Document Reviewed: 06/03/2014 Elsevier Interactive Patient Education  2017 [employer].

## 2022-02-08 ENCOUNTER — Ambulatory Visit: Payer: Self-pay

## 2022-02-08 LAB — BMP8+EGFR
BUN/Creatinine Ratio: 22 (ref 10–24)
BUN: 23 mg/dL (ref 8–27)
CO2: 26 mmol/L (ref 20–29)
Calcium: 9.7 mg/dL (ref 8.6–10.2)
Chloride: 102 mmol/L (ref 96–106)
Creatinine, Ser: 1.06 mg/dL (ref 0.76–1.27)
Glucose: 120 mg/dL — ABNORMAL HIGH (ref 70–99)
Potassium: 5 mmol/L (ref 3.5–5.2)
Sodium: 141 mmol/L (ref 134–144)
eGFR: 70 mL/min/{1.73_m2} (ref >59)

## 2022-02-08 LAB — PSA: Prostate Specific Ag, Serum: 16.2 ng/mL — ABNORMAL HIGH (ref 0.0–4.0)

## 2022-02-08 NOTE — Patient Instructions (Signed)
Visit Information  Thank you for taking time to visit with me today. Please don't hesitate to contact me if I can be of assistance to you.   Following are the goals we discussed today:   Goals Addressed             This Visit's Progress    COMPLETED: Care Coordination Activities       Care Coordination Interventions: Collaboration with patients primary care provider requesting SW assist with placement Outbound call placed to patients friend/ caregiver [name] to determine placement is not desired at this time as [name] feels the patient is doing well at home  Education provided on placement process including cost of ALF, SNF, and long term care Medicaid Instructed [name] to contact the patients primary care provider as needed        If you are experiencing a Mental Health or [place] or need someone to talk to, please go to [facility] [address], [place] [phone])  The patient verbalized understanding of instructions, educational materials, and care plan provided today and DECLINED offer to receive copy of patient instructions, educational materials, and care plan.   No further follow up required: Please contact your primary care provider as needed  [name], [name], CDP Social Worker, Certified Dementia Practitioner [place] [facility] [phone]

## 2022-02-08 NOTE — Patient Outreach (Signed)
  Care Coordination   Initial Visit Note   02/08/2022 Name: KRISTOFFER BALA MRN: [id number] DOB: 1936/02/22  DEMETRICE COMBES is a 86 y.o. year old male who sees [name], FNP for primary care. I  spoke with patients friend and caregiver [name] by phone  What matters to the patients health and wellness today?  For the patient to remain safe at home    Goals Addressed             This Visit's Progress    COMPLETED: Care Coordination Activities       Care Coordination Interventions: Collaboration with patients primary care provider requesting SW assist with placement Outbound call placed to patients friend/ caregiver [name] to determine placement is not desired at this time as [name] feels the patient is doing well at home  Education provided on placement process including cost of ALF, SNF, and long term care Medicaid Instructed [name] to contact the patients primary care provider as needed        SDOH assessments and interventions completed:  No     Care Coordination Interventions Activated:  Yes  Care Coordination Interventions:  Yes, provided   Follow up plan: No further intervention required.   Encounter Outcome:  Pt. Visit Completed   [name], BSW, CDP Social Worker, Certified Dementia Practitioner [facility]  [facility] [phone]

## 2022-02-12 LAB — MULTIPLE MYELOMA PANEL, SERUM
Albumin SerPl Elph-Mcnc: 3.2 g/dL (ref 2.9–4.4)
Albumin/Glob SerPl: 1.3 (ref 0.7–1.7)
Alpha 1: 0.2 g/dL (ref 0.0–0.4)
Alpha2 Glob SerPl Elph-Mcnc: 0.7 g/dL (ref 0.4–1.0)
B-Globulin SerPl Elph-Mcnc: 0.9 g/dL (ref 0.7–1.3)
Gamma Glob SerPl Elph-Mcnc: 0.8 g/dL (ref 0.4–1.8)
Globulin, Total: 2.6 g/dL (ref 2.2–3.9)
IgA/Immunoglobulin A, Serum: 222 mg/dL (ref 61–437)
IgG (Immunoglobin G), Serum: 784 mg/dL (ref 603–1613)
IgM (Immunoglobulin M), Srm: 77 mg/dL (ref 15–143)
Total Protein: 5.8 g/dL — ABNORMAL LOW (ref 6.0–8.5)

## 2022-02-16 ENCOUNTER — Other Ambulatory Visit: Payer: Self-pay

## 2022-02-16 ENCOUNTER — Emergency Department (HOSPITAL_COMMUNITY): Payer: Medicare Other

## 2022-02-16 ENCOUNTER — Emergency Department (HOSPITAL_COMMUNITY)
Admission: EM | Admit: 2022-02-16 | Discharge: 2022-02-16 | Disposition: A | Discharge status: Home / Self Care | Payer: Medicare Other | Attending: Student | Admitting: Student

## 2022-02-16 ENCOUNTER — Encounter (HOSPITAL_COMMUNITY): Payer: Self-pay | Admitting: *Deleted

## 2022-02-16 DIAGNOSIS — Z743 Need for continuous supervision: Secondary | ICD-10-CM | POA: Diagnosis not present

## 2022-02-16 DIAGNOSIS — W19XXXA Unspecified fall, initial encounter: Secondary | ICD-10-CM | POA: Diagnosis not present

## 2022-02-16 DIAGNOSIS — G20C Parkinsonism, unspecified: Secondary | ICD-10-CM | POA: Diagnosis not present

## 2022-02-16 DIAGNOSIS — Z043 Encounter for examination and observation following other accident: Secondary | ICD-10-CM | POA: Diagnosis not present

## 2022-02-16 DIAGNOSIS — S0990XA Unspecified injury of head, initial encounter: Secondary | ICD-10-CM | POA: Diagnosis not present

## 2022-02-16 DIAGNOSIS — M25511 Pain in right shoulder: Secondary | ICD-10-CM | POA: Diagnosis not present

## 2022-02-16 DIAGNOSIS — W01198A Fall on same level from slipping, tripping and stumbling with subsequent striking against other object, initial encounter: Secondary | ICD-10-CM | POA: Diagnosis not present

## 2022-02-16 DIAGNOSIS — I499 Cardiac arrhythmia, unspecified: Secondary | ICD-10-CM | POA: Diagnosis not present

## 2022-02-16 DIAGNOSIS — R079 Chest pain, unspecified: Secondary | ICD-10-CM | POA: Diagnosis not present

## 2022-02-16 DIAGNOSIS — M25711 Osteophyte, right shoulder: Secondary | ICD-10-CM | POA: Diagnosis not present

## 2022-02-16 DIAGNOSIS — S42031A Displaced fracture of lateral end of right clavicle, initial encounter for closed fracture: Secondary | ICD-10-CM | POA: Diagnosis not present

## 2022-02-16 DIAGNOSIS — S42121A Displaced fracture of acromial process, right shoulder, initial encounter for closed fracture: Secondary | ICD-10-CM

## 2022-02-16 DIAGNOSIS — R739 Hyperglycemia, unspecified: Secondary | ICD-10-CM | POA: Diagnosis not present

## 2022-02-16 DIAGNOSIS — S4991XA Unspecified injury of right shoulder and upper arm, initial encounter: Secondary | ICD-10-CM | POA: Diagnosis present

## 2022-02-16 DIAGNOSIS — S4992XA Unspecified injury of left shoulder and upper arm, initial encounter: Secondary | ICD-10-CM | POA: Diagnosis not present

## 2022-02-16 DIAGNOSIS — S59901A Unspecified injury of right elbow, initial encounter: Secondary | ICD-10-CM | POA: Diagnosis not present

## 2022-02-16 LAB — CBC WITH DIFFERENTIAL/PLATELET
Abs Immature Granulocytes: 0.07 10*3/uL (ref 0.00–0.07)
Basophils Absolute: 0.1 10*3/uL (ref 0.0–0.1)
Basophils Relative: 1 %
Eosinophils Absolute: 0.1 10*3/uL (ref 0.0–0.5)
Eosinophils Relative: 1 %
HCT: 42.2 % (ref 39.0–52.0)
Hemoglobin: 14.2 g/dL (ref 13.0–17.0)
Immature Granulocytes: 0 %
Lymphocytes Relative: 5 %
Lymphs Abs: 0.8 10*3/uL (ref 0.7–4.0)
MCH: 31.7 pg (ref 26.0–34.0)
MCHC: 33.6 g/dL (ref 30.0–36.0)
MCV: 94.2 fL (ref 80.0–100.0)
Monocytes Absolute: 1.3 10*3/uL — ABNORMAL HIGH (ref 0.1–1.0)
Monocytes Relative: 8 %
Neutro Abs: 14.2 10*3/uL — ABNORMAL HIGH (ref 1.7–7.7)
Neutrophils Relative %: 85 %
Platelets: 256 10*3/uL (ref 150–400)
RBC: 4.48 MIL/uL (ref 4.22–5.81)
RDW: 13.2 % (ref 11.5–15.5)
WBC: 16.4 10*3/uL — ABNORMAL HIGH (ref 4.0–10.5)
nRBC: 0 % (ref 0.0–0.2)

## 2022-02-16 LAB — URINALYSIS, ROUTINE W REFLEX MICROSCOPIC
Bilirubin Urine: NEGATIVE
Glucose, UA: NEGATIVE mg/dL
Hgb urine dipstick: NEGATIVE
Ketones, ur: 5 mg/dL — AB
Leukocytes,Ua: NEGATIVE
Nitrite: NEGATIVE
Protein, ur: NEGATIVE mg/dL
Specific Gravity, Urine: 1.019 (ref 1.005–1.030)
pH: 5 (ref 5.0–8.0)

## 2022-02-16 LAB — COMPREHENSIVE METABOLIC PANEL
ALT: <5 U/L (ref 0–44)
AST: 16 U/L (ref 15–41)
Albumin: 3.3 g/dL — ABNORMAL LOW (ref 3.5–5.0)
Alkaline Phosphatase: 55 U/L (ref 38–126)
Anion gap: 5 (ref 5–15)
BUN: 20 mg/dL (ref 8–23)
CO2: 29 mmol/L (ref 22–32)
Calcium: 9.2 mg/dL (ref 8.9–10.3)
Chloride: 108 mmol/L (ref 98–111)
Creatinine, Ser: 0.99 mg/dL (ref 0.61–1.24)
GFR, Estimated: >60 mL/min (ref >60)
Glucose, Bld: 96 mg/dL (ref 70–99)
Potassium: 4.4 mmol/L (ref 3.5–5.1)
Sodium: 142 mmol/L (ref 135–145)
Total Bilirubin: 0.5 mg/dL (ref 0.3–1.2)
Total Protein: 6.1 g/dL — ABNORMAL LOW (ref 6.5–8.1)

## 2022-02-16 MED ORDER — ACETAMINOPHEN 500 MG PO TABS: 500.0000 mg | ORAL_TABLET | Freq: Four times a day (QID) | ORAL | 0 refills | Status: DC | PRN

## 2022-02-16 MED ORDER — OXYCODONE HCL 5 MG PO TABS: 2.5000 mg | ORAL_TABLET | ORAL | 0 refills | Status: DC | PRN

## 2022-02-16 MED ORDER — HYDROCODONE-ACETAMINOPHEN 5-325 MG PO TABS
1.0000 | ORAL_TABLET | Freq: Once | ORAL | Status: AC
  Administered 2022-02-16: 1 via ORAL
  Filled 2022-02-16: qty 1

## 2022-02-16 MED ORDER — FENTANYL CITRATE PF 50 MCG/ML IJ SOSY: 50.0000 ug | PREFILLED_SYRINGE | Freq: Once | INTRAMUSCULAR | Status: DC

## 2022-02-16 NOTE — ED Notes (Signed)
Pt able to ambulate to bedside commode with standby assist.  Family friends notified staff that pt has people coming in to sit with him throughout the day.

## 2022-02-16 NOTE — Progress Notes (Signed)
Orthopedic Tech Progress Note Patient Details:  Lee Martinez [birth year] [id number]  Ortho Devices Type of Ortho Device: Shoulder immobilizer Ortho Device/Splint Location: Right arm Ortho Device/Splint Interventions: Application   Post Interventions Patient Tolerated: Well  Nayib Remer E Belvie Iribe 02/16/2022, 3:50 PM

## 2022-02-16 NOTE — ED Provider Notes (Signed)
[facility] EMERGENCY DEPARTMENT Provider Note  CSN: [id number] Arrival date & time: 02/16/22 1053  Chief Complaint(s) Fall  HPI Lee Martinez is a 86 y.o. male with PMH Parkinson's disease, depression who presents emergency department evaluation of a fall.  Patient had a mechanical fall today where he tripped and landed on his right shoulder.  On arrival, patient states that he is currently not experiencing any pain anywhere but EMS providers are concerned that he has an obvious deformity to his right shoulder.  Patient denies numbness, tingling, weakness or other neurologic complaints.  Denies chest pain, shortness of breath, abdominal pain, nausea, vomiting or other systemic complaints.   Past Medical History Past Medical History:  Diagnosis Date   Parkinson's disease 07/2011   followed by [facility]rology   Patient Active Problem List   Diagnosis Date Noted   Major depressive disorder, single episode 06/17/2013   Mood disorder in conditions classified elsewhere 03/31/2013   Parkinson's disease 01/19/2013   Home Medication(s) Prior to Admission medications   Medication Sig Start Date End Date Taking? Authorizing Provider  atorvastatin (LIPITOR) 10 MG tablet Take 1 tab daily in evening MWF 02/07/22   [name], FNP  calcium gluconate 500 MG tablet Take 1 tablet by mouth daily. Patient not taking: Reported on 11/08/2021    [provider]  carbidopa-levodopa (SINEMET IR) 25-100 MG tablet Take 2 tablets by mouth 3 (three) times daily. 10/02/21   Penumalli, Earlean Polka, MD  cholecalciferol (VITAMIN D) 1000 UNITS tablet Take 1,000 Units by mouth daily. Patient not taking: Reported on 11/08/2021    [provider]  fish oil-omega-3 fatty acids 1000 MG capsule Take 2 g by mouth daily. Patient not taking: Reported on 11/08/2021    [provider]  magnesium 30 MG tablet Take 30 mg by mouth daily. Patient not taking: Reported on 11/08/2021     [provider]  Multiple Vitamin (MULTIVITAMIN) tablet Take 1 tablet by mouth daily. Patient not taking: Reported on 11/08/2021    [provider]  saw palmetto 160 MG capsule Take 160 mg by mouth 2 (two) times daily. Patient not taking: Reported on 11/08/2021    [provider]  sertraline (ZOLOFT) 50 MG tablet Take 1 tablet (50 mg total) by mouth daily. 02/07/22 02/07/23  [name], FNP                                                                                                                                    Past Surgical History Past Surgical History:  Procedure Laterality Date   basil removal     HERNIA REPAIR     THYROID SURGERY     Family History Family History  Problem Relation Age of Onset   Heart failure Mother    Breast cancer Sister     Social History Social History   Tobacco Use   Smoking status: Never  Smokeless tobacco: Never  Vaping Use   Vaping Use: Never used  Substance Use Topics   Alcohol use: Not Currently    Alcohol/week: 1.0 standard drink of alcohol    Types: 1 Shots of liquor per week   Drug use: No   Allergies Patient has no known allergies.  Review of Systems Review of Systems  Musculoskeletal:  Positive for arthralgias and myalgias.    Physical Exam Vital Signs  I have reviewed the triage vital signs BP 110/69 (BP Location: Left Arm)   Pulse 96   Temp 98 F (36.7 C) (Temporal)   Resp 20   Ht 6' (1.829 m)   Wt 68 kg   SpO2 92%   BMI 20.34 kg/m   Physical Exam Vitals and nursing note reviewed.  Constitutional:      General: He is not in acute distress.    Appearance: He is well-developed.  HENT:     Head: Normocephalic and atraumatic.  Eyes:     Conjunctiva/sclera: Conjunctivae normal.  Cardiovascular:     Rate and Rhythm: Normal rate and regular rhythm.     Heart sounds: No murmur heard. Pulmonary:     Effort: Pulmonary effort is normal. No respiratory distress.     Breath sounds:  Normal breath sounds.  Abdominal:     Palpations: Abdomen is soft.     Tenderness: There is no abdominal tenderness.  Musculoskeletal:        General: Swelling, tenderness and deformity present.     Cervical back: Neck supple.  Skin:    General: Skin is warm and dry.     Capillary Refill: Capillary refill takes less than 2 seconds.  Neurological:     Mental Status: He is alert.  Psychiatric:        Mood and Affect: Mood normal.     ED Results and Treatments Labs (all labs ordered are listed, but only abnormal results are displayed) Labs Reviewed  COMPREHENSIVE METABOLIC PANEL - Abnormal; Notable for the following components:      Result Value   Total Protein 6.1 (*)    Albumin 3.3 (*)    All other components within normal limits  CBC WITH DIFFERENTIAL/PLATELET - Abnormal; Notable for the following components:   WBC 16.4 (*)    Neutro Abs 14.2 (*)    Monocytes Absolute 1.3 (*)    All other components within normal limits                                                                                                                          Radiology DG Shoulder Right  Result Date: 02/16/2022 CLINICAL DATA:  Pain EXAM: RIGHT SHOULDER - 2+ VIEW COMPARISON:  Study done earlier today FINDINGS: No fracture or dislocation is seen. Small calcification is noted between acromion and humeral head, possibly suggesting calcific tendinosis. Small bony spurs are seen in the inferior aspect of glenoid. IMPRESSION: No fracture or dislocation is seen. Possible calcific tendinosis. Minimal  bony spurs are seen in the inferior aspect of glenoid. Electronically Signed   By: Elmer Picker M.D.   On: 02/16/2022 14:05   DG Chest Portable 1 View  Result Date: 02/16/2022 CLINICAL DATA:  Solid bathroom with pain to right shoulder EXAM: PORTABLE CHEST 1 VIEW COMPARISON:  Chest x-ray May 27, 2016 FINDINGS: The heart size and mediastinal contours are within normal limits. Left basilar bandlike  opacity, likely atelectasis. No large pleural effusion or pneumothorax. Acute comminuted minimally displaced fracture of the right acromion process. The visualized upper abdomen is unremarkable. IMPRESSION: 1. Acute comminuted minimally displaced fracture of the right acromion process. 2. No acute cardiopulmonary abnormality. Electronically Signed   By: Beryle Flock M.D.   On: 02/16/2022 13:56   DG Shoulder Left  Result Date: 02/16/2022 CLINICAL DATA:  Fall in bathroom today. Left shoulder injury and pain. EXAM: LEFT SHOULDER - 2+ VIEW COMPARISON:  None Available. FINDINGS: There is no evidence of fracture or dislocation. There is no evidence of arthropathy or other focal bone abnormality. Mild soft tissue calcification is seen in the distal rotator cuff tendon, consistent with calcific tendinopathy. IMPRESSION: No acute findings. Calcific tendinopathy involving the rotator cuff. Electronically Signed   By: Marlaine Hind M.D.   On: 02/16/2022 13:55   DG Elbow Complete Right  Result Date: 02/16/2022 CLINICAL DATA:  Fall in bathroom today. Right elbow injury and pain. EXAM: RIGHT ELBOW - COMPLETE 3+ VIEW COMPARISON:  None Available. FINDINGS: There is no evidence of fracture, dislocation, or joint effusion. There is no evidence of arthropathy or other focal bone abnormality. Soft tissues are unremarkable. IMPRESSION: Negative. Electronically Signed   By: Marlaine Hind M.D.   On: 02/16/2022 13:54   CT Cervical Spine Wo Contrast  Result Date: 02/16/2022 CLINICAL DATA:  Fall EXAM: CT CERVICAL SPINE WITHOUT CONTRAST TECHNIQUE: Multidetector CT imaging of the cervical spine was performed without intravenous contrast. Multiplanar CT image reconstructions were also generated. RADIATION DOSE REDUCTION: This exam was performed according to the departmental dose-optimization program which includes automated exposure control, adjustment of the mA and/or kV according to patient size and/or use of iterative  reconstruction technique. COMPARISON:  None Available. FINDINGS: Alignment: Slight degenerative anterolisthesis is present at C4-5 and again at C7-T1. No other significant listhesis is present. Straightening of the normal cervical lordosis is present. Skull base and vertebrae: Craniocervical junction is within normal limits. Vertebral body heights are normal. No acute fractures are present. Soft tissues and spinal canal: No prevertebral fluid or swelling. No visible canal hematoma. Disc levels: Left foraminal narrowing is present at C3-4, C5-6 and C6-7 due to uncovertebral and facet hypertrophy. Upper chest: The lung apices are clear. Thoracic inlet is within normal limits. IMPRESSION: 1. No acute fracture or traumatic subluxation. 2. Multilevel degenerative changes of the cervical spine as described. Electronically Signed   By: San Morelle M.D.   On: 02/16/2022 13:43   CT HEAD WO CONTRAST (5MM)  Result Date: 02/16/2022 CLINICAL DATA:  Head trauma.  Fall. EXAM: CT HEAD WITHOUT CONTRAST TECHNIQUE: Contiguous axial images were obtained from the base of the skull through the vertex without intravenous contrast. RADIATION DOSE REDUCTION: This exam was performed according to the departmental dose-optimization program which includes automated exposure control, adjustment of the mA and/or kV according to patient size and/or use of iterative reconstruction technique. COMPARISON:  None Available. FINDINGS: Brain: The study is severely degraded by patient motion and extensive streak artifact secondary to the patient's hand and arm on his  head. No definite acute infarct, hemorrhage or mass lesion is present. The ventricles are of normal size. No significant extraaxial fluid collection is present. Vascular: Atherosclerotic calcifications are present within the cavernous internal carotid arteries. No hyperdense vessel is present. Skull: Calvarium is intact. No focal lytic or blastic lesions are present. No  significant extracranial soft tissue lesion is present. Sinuses/Orbits: The globes and orbits are within normal limits. The paranasal sinuses and mastoid air cells are clear. IMPRESSION: 1. The study is severely degraded by patient motion and extensive streak artifact secondary to the patient's hand and arm on his head. 2. No definite acute intracranial abnormality. Recommend repeat CT of the head when the patient can be position without his hand and arm on the head. Subtle injuries may not be visible on this scan. 3. Atherosclerosis. Electronically Signed   By: San Morelle M.D.   On: 02/16/2022 13:40   DG Shoulder Right  Result Date: 02/16/2022 CLINICAL DATA:  Fall in the bathroom with pain to the right shoulder. EXAM: RIGHT SHOULDER - 2+ VIEW COMPARISON:  Chest radiograph dated 05/27/2016. FINDINGS: There is no evidence of fracture or dislocation. There is no evidence of arthropathy or other focal bone abnormality. Soft tissues are unremarkable. IMPRESSION: Negative. Electronically Signed   By: Zerita Boers M.D.   On: 02/16/2022 12:22    Pertinent labs & imaging results that were available during my care of the patient were reviewed by me and considered in my medical decision making (see MDM for details).  Medications Ordered in ED Medications  HYDROcodone-acetaminophen (NORCO/VICODIN) 5-325 MG per tablet 1 tablet (1 tablet Oral Given 02/16/22 1343)                                                                                                                                     Procedures Procedures  (including critical care time)  Medical Decision Making / ED Course   This patient presents to the ED for concern of fall, shoulder pain, this involves an extensive number of treatment options, and is a complaint that carries with it a high risk of complications and morbidity.  The differential diagnosis includes fracture, dislocation, ligamentous injury, contusion  MDM: Seen  emergency room for evaluation of a fall.  Physical exam with tenderness to the right shoulder but is otherwise unremarkable.  Initial x-ray of the shoulder is negative.  On reevaluation, patient is complaining of worsening bilateral shoulder pain and pain control was given as well as an expanded trauma imaging was performed.  Trauma imaging with negative x-rays of left shoulder, chest, C-spine.  CT head with significant artifact due to patient moving but I have overall low suspicion for intracranial bleed at this time given current exam.  On third reevaluation, patient with palpable instability of the shoulder on the right and an additional x-ray of the shoulder was obtained to make sure the patient had not dislocated while in  the emergency department which was negative.  However, on the chest x-ray, there is a fracture at the acromion on the right.  I consulted orthopedics who recommends sling and follow-up.  Patient currently ambulates using a walker and I am concerned that this new injury may hamper his ability to ambulate.  At time of signout, patient is currently pending a urinalysis to evaluate for UTI as the source of his fall today and an ambulatory trial in a sling with a walker.  If unable to ambulate, physical therapy evaluation will be required.  Patient then signed out to oncoming provider.  Please see provider signout for continuation of work-up.   Additional history obtained: -Additional history obtained from son -External records from outside source obtained and reviewed including: Chart review including previous notes, labs, imaging, consultation notes   Lab Tests: -I ordered, reviewed, and interpreted labs.   The pertinent results include:   Labs Reviewed  COMPREHENSIVE METABOLIC PANEL - Abnormal; Notable for the following components:      Result Value   Total Protein 6.1 (*)    Albumin 3.3 (*)    All other components within normal limits  CBC WITH DIFFERENTIAL/PLATELET - Abnormal;  Notable for the following components:   WBC 16.4 (*)    Neutro Abs 14.2 (*)    Monocytes Absolute 1.3 (*)    All other components within normal limits     Imaging Studies ordered: I ordered imaging studies including multiple trauma x-rays, CT head, C-spine I independently visualized and interpreted imaging. I agree with the radiologist interpretation   Medicines ordered and prescription drug management: Meds ordered this encounter  Medications   DISCONTD: fentaNYL (SUBLIMAZE) injection 50 mcg   HYDROcodone-acetaminophen (NORCO/VICODIN) 5-325 MG per tablet 1 tablet    -I have reviewed the patients home medicines and have made adjustments as needed  Critical interventions none  Consultations Obtained: I requested consultation with the orthopedist on-call Dr Lucia Gaskins,  and discussed lab and imaging findings as well as pertinent plan - they recommend: Sling and outpatient follow-up   Cardiac Monitoring: The patient was maintained on a cardiac monitor.  I personally viewed and interpreted the cardiac monitored which showed an underlying rhythm of: NSR  Social Determinants of Health:  Factors impacting patients care include: Lives at home   Reevaluation: After the interventions noted above, I reevaluated the patient and found that they have :improved  Co morbidities that complicate the patient evaluation  Past Medical History:  Diagnosis Date   Parkinson's disease 07/2011   followed by [facility]      Dispostion: I considered admission for this patient, and disposition pending ability to ambulate with walker and urinalysis.  Please see provider signout for continuation of work-up     Final Clinical Impression(s) / ED Diagnoses Final diagnoses:  None     '@PCDICTATION'$ @    Corinthian Mizrahi, Debe Coder, MD 02/16/22 1528

## 2022-02-16 NOTE — ED Triage Notes (Signed)
Patient presents to ed via [facility] from home was walking into the bathroom with his walker and had a fall, denies hitting his head no loc. C/o pain in his right shoulder , abrasion to right shoulder and small skin tear to right elbow. Alert oriented  , at baseline for patient . Patient was given Fentanyl 100 mcq  per ems

## 2022-02-16 NOTE — ED Provider Notes (Signed)
  Provider Note MRN:  [id number]  Arrival date & time: 02/16/22    ED Course and Medical Decision Making  Assumed care from [place] at shift change.  See note from prior team for complete details, in brief:  86 yo male Hx parkinsons dz Fall at home in bathroom Found to have [facility] injury Pain appears to be well controlled Has excellent support system at home and multiple friends/family members who help with his care at home D/w family at bedside; comfortably with plan for discharge home with close o/p f/u with ortho and PCP He is ambulatory at his typical level Acting normally per support person/family at bedside  The patient improved significantly and was discharged in stable condition. Detailed discussions were had with the patient regarding current findings, and need for close f/u with PCP or on call doctor. The patient has been instructed to return immediately if the symptoms worsen in any way for re-evaluation. Patient verbalized understanding and is in agreement with current care plan. All questions answered prior to discharge.    Procedures  Final Clinical Impressions(s) / ED Diagnoses     ICD-10-CM   1. Closed displaced fracture of acromial process of right scapula, initial encounter  S42.121A     2. Fall, initial encounter  [email or web address]       ED Discharge Orders          Ordered    acetaminophen (TYLENOL) 500 MG tablet  Every 6 hours PRN        02/16/22 1634    oxyCODONE (ROXICODONE) 5 MG immediate release tablet  Every 4 hours PRN        02/16/22 1634              Discharge Instructions      It was a pleasure caring for you today in the emergency department.  Please return to the emergency department for any worsening or worrisome symptoms.  Please call orthopedics to arrange for follow up.          Wynona Dove A, DO 02/16/22 1638

## 2022-02-16 NOTE — Discharge Instructions (Addendum)
It was a pleasure caring for you today in the emergency department.  Please return to the emergency department for any worsening or worrisome symptoms.  Please call orthopedics to arrange for follow up.

## 2022-02-18 ENCOUNTER — Telehealth: Payer: Self-pay

## 2022-02-18 NOTE — Telephone Encounter (Signed)
Transition Care Management Follow-up Telephone Call Date of discharge and from where: 02/16/2022  How have you been since you were released from the hospital? Pts friend states he is doing okay.  Any questions or concerns? No  Items Reviewed: Did the pt receive and understand the discharge instructions provided? Yes  Medications obtained and verified? Yes  Other? No  Any new allergies since your discharge? No  Dietary orders reviewed? Yes Do you have support at home? Yes   Home Care and Equipment/Supplies: Were home health services ordered? no If so, what is the name of the agency? N/a  Has the agency set up a time to come to the patient's home? no Were any new equipment or medical supplies ordered?  No What is the name of the medical supply agency? N/a Were you able to get the supplies/equipment? no Do you have any questions related to the use of the equipment or supplies? No  Functional Questionnaire: (I = Independent and D = Dependent) ADLs: i  Bathing/Dressing- i  Meal Prep- i  Eating- i  Maintaining continence- i  Transferring/Ambulation- i  Managing Meds- i  Follow up appointments reviewed:  PCP Hospital f/u appt confirmed? Yes  Scheduled to see janece moore  on n/a @ n/a. [facility] f/u appt confirmed? No  Scheduled to see n/a on n/a @ n/a. Are transportation arrangements needed? No  If their condition worsens, is the pt aware to call PCP or go to the Emergency Dept.? Yes Was the patient provided with contact information for the PCP's office or ED? Yes Was to pt encouraged to call back with questions or concerns? Yes

## 2022-02-20 ENCOUNTER — Ambulatory Visit: Payer: Self-pay

## 2022-02-20 ENCOUNTER — Encounter: Payer: Self-pay | Admitting: Nurse Practitioner

## 2022-02-20 ENCOUNTER — Ambulatory Visit (INDEPENDENT_AMBULATORY_CARE_PROVIDER_SITE_OTHER): Payer: Medicare Other | Admitting: Nurse Practitioner

## 2022-02-20 VITALS — BP 84/60 | HR 92 | Ht 72.0 in

## 2022-02-20 DIAGNOSIS — G20B1 Parkinson's disease with dyskinesia, without mention of fluctuations: Secondary | ICD-10-CM

## 2022-02-20 DIAGNOSIS — W19XXXD Unspecified fall, subsequent encounter: Secondary | ICD-10-CM | POA: Diagnosis not present

## 2022-02-20 DIAGNOSIS — S42124A Nondisplaced fracture of acromial process, right shoulder, initial encounter for closed fracture: Secondary | ICD-10-CM

## 2022-02-20 DIAGNOSIS — D72828 Other elevated white blood cell count: Secondary | ICD-10-CM | POA: Diagnosis not present

## 2022-02-20 MED ORDER — DOXYCYCLINE MONOHYDRATE 100 MG PO CAPS: 100.0000 mg | ORAL_CAPSULE | Freq: Two times a day (BID) | ORAL | 0 refills | Status: DC

## 2022-02-20 MED ORDER — AMOXICILLIN 875 MG PO TABS: 875.0000 mg | ORAL_TABLET | Freq: Two times a day (BID) | ORAL | 0 refills | Status: DC

## 2022-02-20 NOTE — Patient Outreach (Signed)
  Care Coordination   Case Collaboration  Visit Note   02/20/2022 Name: FALCON MCCASKEY MRN: [id number] DOB: [birth year]  SHANNON KIRKENDALL is a 86 y.o. year old male who sees [name], FNP for primary care. I  collaborated with patients primary care provider [name], FNP.  What matters to the patients health and wellness today?  Long term care placement    Goals Addressed             This Visit's Progress    Care Coordination Activities       Care Coordination Interventions: Collaboration with patients primary care provider who indicates patient has had a fall and now has a fractured scapula Determined patient is in need of placement into a high level of care Advised patients primary care provider to instruct the patient and his friend/caregivers to initiate a long term care Medicaid application Discussed plan for SW to follow up with the patient and his friend/caregiver next week         SDOH assessments and interventions completed:  No     Care Coordination Interventions Activated:  Yes  Care Coordination Interventions:  Yes, provided   Follow up plan:  Sw will follow up with the patient over the next week.    Encounter Outcome:  Pt. Visit Completed   [name], BSW, CDP Social Worker, Certified Dementia Practitioner [employer]  Care Coordination [phone]

## 2022-02-20 NOTE — Progress Notes (Signed)
I,Tianna Badgett,acting as a Education administrator for [employer], FNP.,have documented all relevant documentation on the behalf of [name], FNP,as directed by  [name], FNP while in the presence of [name], [place].  Subjective:     Patient ID: Lee Martinez , male    DOB: 1936/02/22 , 86 y.o.   MRN: [id number]   No chief complaint on file.   HPI  He had a fall on Saturday and fractured his right scapula e while a family friend was present. He is now having difficulty with standing up. He is falling backwards. The family friends are having to stay with him and are having difficulty with getting someone to stay. I have given her the number again for the DSS social worker to help them with placement.      Past Medical History:  Diagnosis Date   Parkinson's disease 07/2011   followed by [facility]rology     Family History  Problem Relation Age of Onset   Heart failure Mother    Breast cancer Sister      Current Outpatient Medications:    acetaminophen (TYLENOL) 500 MG tablet, Take 1 tablet (500 mg total) by mouth every 6 (six) hours as needed., Disp: 30 tablet, Rfl: 0   atorvastatin (LIPITOR) 10 MG tablet, Take 1 tab daily in evening MWF, Disp: 45 tablet, Rfl: 1   carbidopa-levodopa (SINEMET IR) 25-100 MG tablet, Take 2 tablets by mouth 3 (three) times daily., Disp: , Rfl:    doxycycline (MONODOX) 100 MG capsule, Take 1 capsule (100 mg total) by mouth 2 (two) times daily., Disp: 14 capsule, Rfl: 0   oxyCODONE (ROXICODONE) 5 MG immediate release tablet, Take 0.5-1 tablets (2.5-5 mg total) by mouth every 4 (four) hours as needed for severe pain., Disp: 10 tablet, Rfl: 0   sertraline (ZOLOFT) 50 MG tablet, Take 1 tablet (50 mg total) by mouth daily., Disp: 90 tablet, Rfl: 1   calcium gluconate 500 MG tablet, Take 1 tablet by mouth daily. (Patient not taking: Reported on 11/08/2021), Disp: , Rfl:    cholecalciferol (VITAMIN D) 1000 UNITS tablet, Take 1,000 Units by mouth daily. (Patient  not taking: Reported on 11/08/2021), Disp: , Rfl:    fish oil-omega-3 fatty acids 1000 MG capsule, Take 2 g by mouth daily. (Patient not taking: Reported on 11/08/2021), Disp: , Rfl:    magnesium 30 MG tablet, Take 30 mg by mouth daily. (Patient not taking: Reported on 11/08/2021), Disp: , Rfl:    Multiple Vitamin (MULTIVITAMIN) tablet, Take 1 tablet by mouth daily. (Patient not taking: Reported on 11/08/2021), Disp: , Rfl:    saw palmetto 160 MG capsule, Take 160 mg by mouth 2 (two) times daily. (Patient not taking: Reported on 11/08/2021), Disp: , Rfl:    No Known Allergies   Review of Systems  Constitutional: Negative.   Respiratory: Negative.    Cardiovascular: Negative.   Gastrointestinal: Negative.   Neurological: Negative.      Today's Vitals   02/20/22 1447  BP: (!) 84/60  Pulse: 92  Height: 6' (1.829 m)   Body mass index is 20.34 kg/m.   Objective:  Physical Exam Vitals reviewed.  Constitutional:      Appearance: Normal appearance.  Cardiovascular:     Rate and Rhythm: Normal rate and regular rhythm.     Pulses: Normal pulses.     Heart sounds: Normal heart sounds.  Pulmonary:     Effort: Pulmonary effort is normal. No respiratory distress.  Breath sounds: Normal breath sounds. No wheezing.  Musculoskeletal:     Comments: Bruising to right posterior back and anterior upper chest.   Skin:    General: Skin is warm and dry.     Capillary Refill: Capillary refill takes less than 2 seconds.  Neurological:     General: No focal deficit present.     Mental Status: He is alert and oriented to person, place, and time.     Cranial Nerves: No cranial nerve deficit.     Motor: No weakness.  Psychiatric:        Mood and Affect: Mood normal.        Behavior: Behavior normal.        Thought Content: Thought content normal.        Judgment: Judgment normal.         Assessment And Plan:     1. Closed nondisplaced fracture of acromial process of right scapula, initial  encounter Comments: He is having difficulty with raising his arm and keeping on his sling. Will refer to orthopedics for further evaluation.  - Amb Referral to Palliative Care - Ambulatory referral to Orthopedic Surgery  2. Parkinson's disease with dyskinesia without fluctuating manifestations Comments: Continue f/u with Neurology. After making referal to palliative he is not going to be started due to new criteria for home patients with [facility] - Amb Referral to Palliative Care  3. Other elevated white blood cell (WBC) count Comments: I will start him on an antibiotic. I have also discussed with his friend to contact Urology due to elevated PSA - doxycycline (MONODOX) 100 MG capsule; Take 1 capsule (100 mg total) by mouth 2 (two) times daily.  Dispense: 14 capsule; Refill: 0  4. Fall, subsequent encounter Comments: I have stressed again the importance to make sure patient is safe and using assistive devices.  I have explained to the friend he does not have a health concern at this time for admission.       Patient was given opportunity to ask questions. Patient verbalized understanding of the plan and was able to repeat key elements of the plan. All questions were answered to their satisfaction.  [name], FNP   I, [name], FNP, have reviewed all documentation for this visit. The documentation on 02/20/22 for the exam, diagnosis, procedures, and orders are all accurate and complete.   IF YOU HAVE BEEN REFERRED TO A SPECIALIST, IT MAY TAKE 1-2 WEEKS TO SCHEDULE/PROCESS THE REFERRAL. IF YOU HAVE NOT HEARD FROM US/SPECIALIST IN TWO WEEKS, PLEASE GIVE [place] A CALL AT [phone].   THE PATIENT IS ENCOURAGED TO PRACTICE SOCIAL DISTANCING DUE TO THE COVID-19 PANDEMIC.

## 2022-02-20 NOTE — Patient Instructions (Addendum)
when you apply for Medicaid you need to say it is for long term care and ask for a placement worker. LTC Medicaid covers SNF.   [facility] - call them to get information about placement Address: [address], [place], [place] [address] Phone: [phone]  [facility]   [phone]

## 2022-02-25 ENCOUNTER — Ambulatory Visit: Payer: Self-pay

## 2022-02-25 NOTE — Patient Outreach (Signed)
  Care Coordination   Follow Up Visit Note   02/25/2022 Name: Lee Martinez MRN: [id number] DOB: 07/04/1935  Lee Martinez is a 86 y.o. year old male who sees [name], FNP for primary care. I  spoke with patients friend and caregiver [name] by phone today.  What matters to the patients health and wellness today?  To obtain placement into long term care    Goals Addressed             This Visit's Progress    Care Coordination Activities       Care Coordination Interventions: Determined the patients friend and caregiver [name] has contacted Medicaid and initiated a long-term Medicaid application via phone [name] reports she is awaiting a document in the mail in order to complete the application Discussed friends are checking on the patient multiple times per day and have identified someone who will stay overnight with the patient until he is placed Advised by [name] that the patient is COVID positive but is doing okay at this time Instructed [name] to contact the patients primary care provider if symptoms worsen Discussed plans for SW to follow up over the next two weeks to assess progress of placement Collaboration with patients primary care provider [name] FNP to advise of above interventions and plan         SDOH assessments and interventions completed:  No     Care Coordination Interventions Activated:  Yes  Care Coordination Interventions:  Yes, provided   Follow up plan: Follow up call scheduled for 10/30    Encounter Outcome:  Pt. Visit Completed   [name], BSW, CDP Social Worker, Certified Dementia Practitioner [facility]  Care Coordination [phone]

## 2022-02-25 NOTE — Patient Instructions (Signed)
Visit Information  Thank you for taking time to visit with me today. Please don't hesitate to contact me if I can be of assistance to you.   Following are the goals we discussed today:   Goals Addressed             This Visit's Progress    Care Coordination Activities       Care Coordination Interventions: Determined the patients friend and caregiver [name] has contacted Medicaid and initiated a long-term Medicaid application via phone [name] reports she is awaiting a document in the mail in order to complete the application Discussed friends are checking on the patient multiple times per day and have identified someone who will stay overnight with the patient until he is placed Advised by [name] that the patient is COVID positive but is doing okay at this time Instructed [name] to contact the patients primary care provider if symptoms worsen Discussed plans for SW to follow up over the next two weeks to assess progress of placement Collaboration with patients primary care provider [name] FNP to advise of above interventions and plan         Our next appointment is by telephone on 10/30 at 10:00  Please call the care guide team at [phone] if you need to cancel or reschedule your appointment.   If you are experiencing a Mental Health or [place] or need someone to talk to, please go to [facility] [place] [phone])  The patient verbalized understanding of instructions, educational materials, and care plan provided today and DECLINED offer to receive copy of patient instructions, educational materials, and care plan.   Telephone follow up appointment with care management team member scheduled for:10/30  [name], BSW, CDP Social Worker, Certified Dementia Practitioner [facility]  Care Coordination [phone]

## 2022-03-11 ENCOUNTER — Ambulatory Visit: Payer: Self-pay

## 2022-03-11 NOTE — Patient Instructions (Signed)
Visit Information  Thank you for taking time to visit with me today. Please don't hesitate to contact me if I can be of assistance to you.   Following are the goals we discussed today:   Goals Addressed             This Visit's Progress    Care Coordination Activities       Care Coordination Interventions: Determined the patients friend and caregiver [name] has contacted Medicaid to follow up on the patients application status - was advised the worker did not see anything in the system Discussed plans for [name] to contact Medicaid today to re-initiate the patients application Advised [name] she may also submit an application online Reviewed patient is doing well at home with friends checking on him every 3 hours Provided [name] with SW contact number to provide feedback on outcome of call with social services         Our next appointment is by telephone on 11/13 at 10:00 am  Please call the care guide team at [phone] if you need to cancel or reschedule your appointment.   If you are experiencing a Mental Health or [place] or need someone to talk to, please call 1-800-273-TALK (toll free, 24 hour hotline)  The patient verbalized understanding of instructions, educational materials, and care plan provided today and DECLINED offer to receive copy of patient instructions, educational materials, and care plan.   Telephone follow up appointment with care management team member scheduled for:11/13  [name], [name], CDP Social Worker, Certified Dementia Practitioner [place] [facility]  Care Coordination [phone]

## 2022-03-11 NOTE — Patient Outreach (Signed)
  Care Coordination   Follow Up Visit Note   03/11/2022 Name: Lee Martinez MRN: [id number] DOB: [birth year]  Lee Martinez is a 86 y.o. year old male who sees Lee Brine, FNP for primary care. I  spoke with Lee Martinez by phone today  What matters to the patients health and wellness today?  To obtain long term care Medicaid    Goals Addressed             This Visit's Progress    Care Coordination Activities       Care Coordination Interventions: Determined the patients friend and caregiver Lee Schultze has contacted Medicaid to follow up on the patients application status - was advised the worker did not see anything in the system Discussed plans for Lee Martinez to contact Medicaid today to re-initiate the patients application Advised Lee Schultze she may also submit an application online Reviewed patient is doing well at home with friends checking on him every 3 hours Provided Lee Martinez with SW contact number to provide feedback on outcome of call with social services         SDOH assessments and interventions completed:  No     Care Coordination Interventions Activated:  Yes  Care Coordination Interventions:  Yes, provided   Follow up plan: Follow up call scheduled for 11/13    Encounter Outcome:  Pt. Visit Completed   Lee Martinez, Lee Martinez, CDP Social Worker, Certified Dementia Practitioner Lee Martinez  Care Coordination [phone]

## 2022-03-13 ENCOUNTER — Ambulatory Visit (HOSPITAL_BASED_OUTPATIENT_CLINIC_OR_DEPARTMENT_OTHER): Payer: Medicare Other | Admitting: Orthopaedic Surgery

## 2022-03-25 ENCOUNTER — Telehealth: Payer: Self-pay

## 2022-03-25 ENCOUNTER — Ambulatory Visit: Payer: Self-pay
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
## 2022-03-25 NOTE — Patient Outreach (Signed)
  Care Coordination   03/25/2022 Name: Lee Martinez MRN: [id number] DOB: 1935-11-30   Care Coordination Outreach Attempts:  An unsuccessful telephone outreach was attempted for a scheduled appointment today.  Follow Up Plan:  Additional outreach attempts will be made to offer the patient care coordination information and services.   Encounter Outcome:  No Answer  Care Coordination Interventions Activated:  No   Care Coordination Interventions:  No, not indicated    [name], BSW, CDP Social Worker, Certified Dementia Practitioner [facility] Management  Care Coordination [phone]

## 2022-04-01 ENCOUNTER — Telehealth: Payer: Self-pay

## 2022-04-01 ENCOUNTER — Ambulatory Visit (INDEPENDENT_AMBULATORY_CARE_PROVIDER_SITE_OTHER): Payer: Medicare Other

## 2022-04-01 ENCOUNTER — Ambulatory Visit (INDEPENDENT_AMBULATORY_CARE_PROVIDER_SITE_OTHER): Payer: Medicare Other | Admitting: Orthopaedic Surgery

## 2022-04-01 DIAGNOSIS — S4291XA Fracture of right shoulder girdle, part unspecified, initial encounter for closed fracture: Secondary | ICD-10-CM | POA: Diagnosis not present

## 2022-04-01 DIAGNOSIS — M25511 Pain in right shoulder: Secondary | ICD-10-CM

## 2022-04-01 NOTE — Patient Outreach (Signed)
  Care Coordination   04/01/2022 Name: Lee Martinez MRN: [id number] DOB: 06-16-1935   Care Coordination Outreach Attempts:  An unsuccessful telephone outreach was attempted for a scheduled appointment today.  Follow Up Plan:  Additional outreach attempts will be made to offer the patient care coordination information and services.   Encounter Outcome:  No Answer  Care Coordination Interventions Activated:  No   Care Coordination Interventions:  No, not indicated    [name], BSW, CDP Social Worker, Certified Dementia Practitioner [facility]  Care Coordination [phone]

## 2022-04-01 NOTE — Progress Notes (Signed)
Chief Complaint: Right acromion fracture     History of Present Illness:    Lee Martinez is a 86 y.o. male presents today after a fall 2 weeks prior for which she sustained an acromion fracture.  Since this time he is using the shoulder close to normal.  He is Jehovah's Witness and he has had multiple members of his community watching out for him the last several weeks.  He does live at home alone.    Surgical History:   none  PMH/PSH/Family History/Social History/Meds/Allergies:    Past Medical History:  Diagnosis Date   Parkinson's disease 07/2011   followed by [facility]   Past Surgical History:  Procedure Laterality Date   basil removal     HERNIA REPAIR     THYROID SURGERY     Social History   Socioeconomic History   Marital status: Widowed    Spouse name: [name]   Number of children: 0   Years of education: 12   Highest education level: Not on file  Occupational History   Occupation: retired    Fish farm manager: RETIRED  Tobacco Use   Smoking status: Never   Smokeless tobacco: Never  Vaping Use   Vaping Use: Never used  Substance and Sexual Activity   Alcohol use: Not Currently    Alcohol/week: 1.0 standard drink of alcohol    Types: 1 Shots of liquor per week   Drug use: No   Sexual activity: Not Currently    Partners: Female  Other Topics Concern   Not on file  Social History Narrative   04/26/20 Lives alone, wife passed this year   Social Determinants of Health   Financial Resource Strain: Low Risk  (02/07/2022)   Overall Financial Resource Strain (CARDIA)    Difficulty of Paying Living Expenses: Not hard at all  Food Insecurity: No Food Insecurity (02/07/2022)   Hunger Vital Sign    Worried About Running Out of Food in the Last Year: Never true    [place] in the Last Year: Never true  Transportation Needs: No Transportation Needs (02/07/2022)   PRAPARE - Hydrologist  (Medical): No    Lack of Transportation (Non-Medical): No  Physical Activity: Inactive (02/07/2022)   Exercise Vital Sign    Days of Exercise per Week: 0 days    Minutes of Exercise per Session: 0 min  Stress: No Stress Concern Present (02/07/2022)   [place]    Feeling of Stress : Not at all  Social Connections: Not on file   Family History  Problem Relation Age of Onset   Heart failure Mother    Breast cancer Sister    No Known Allergies Current Outpatient Medications  Medication Sig Dispense Refill   acetaminophen (TYLENOL) 500 MG tablet Take 1 tablet (500 mg total) by mouth every 6 (six) hours as needed. 30 tablet 0   atorvastatin (LIPITOR) 10 MG tablet Take 1 tab daily in evening MWF 45 tablet 1   calcium gluconate 500 MG tablet Take 1 tablet by mouth daily. (Patient not taking: Reported on 11/08/2021)     carbidopa-levodopa (SINEMET IR) 25-100 MG tablet Take 2 tablets by mouth 3 (three) times daily.     cholecalciferol (VITAMIN D) 1000  UNITS tablet Take 1,000 Units by mouth daily. (Patient not taking: Reported on 11/08/2021)     doxycycline (MONODOX) 100 MG capsule Take 1 capsule (100 mg total) by mouth 2 (two) times daily. 14 capsule 0   fish oil-omega-3 fatty acids 1000 MG capsule Take 2 g by mouth daily. (Patient not taking: Reported on 11/08/2021)     magnesium 30 MG tablet Take 30 mg by mouth daily. (Patient not taking: Reported on 11/08/2021)     Multiple Vitamin (MULTIVITAMIN) tablet Take 1 tablet by mouth daily. (Patient not taking: Reported on 11/08/2021)     oxyCODONE (ROXICODONE) 5 MG immediate release tablet Take 0.5-1 tablets (2.5-5 mg total) by mouth every 4 (four) hours as needed for severe pain. 10 tablet 0   saw palmetto 160 MG capsule Take 160 mg by mouth 2 (two) times daily. (Patient not taking: Reported on 11/08/2021)     sertraline (ZOLOFT) 50 MG tablet Take 1 tablet (50 mg total) by mouth daily. 90  tablet 1   No current facility-administered medications for this visit.   No results found.  Review of Systems:   A ROS was performed including pertinent positives and negatives as documented in the HPI.  Physical Exam :   Constitutional: NAD and appears stated age Neurological: Alert and oriented Psych: Appropriate affect and cooperative There were no vitals taken for this visit.   Comprehensive Musculoskeletal Exam:     minimal tenderness palpation about the right shoulder.  Active forward elevation is to 160 degrees equal bilaterally.  External rotation is to 60.  Imaging:   Xray (3 views right shoulder): There is evidence of an acromial fracture with minimal displacement.  Otherwise normal    I personally reviewed and interpreted the radiographs.   Assessment:   86 y.o. male right-hand-dominant with a right acromial fracture.  At this time his motion has dramatically improved.  I would like him to progress as tolerated.  He is seen back as needed  Plan :    -Return to clinic as needed     I personally saw and evaluated the patient, and participated in the management and treatment plan.  Vanetta Mulders, MD Attending Physician, Orthopedic Surgery  This document was dictated using Dragon voice recognition software. A reasonable attempt at proof reading has been made to minimize errors.

## 2022-04-08 ENCOUNTER — Telehealth: Payer: Self-pay

## 2022-04-08 NOTE — Patient Outreach (Signed)
  Care Coordination   04/08/2022 Name: Lee Martinez MRN: [id number] DOB: [birth year]   Care Coordination Outreach Attempts:  A second unsuccessful outreach was attempted today to offer the patient with information about available care coordination services as a benefit of their health plan.     Follow Up Plan:  Additional outreach attempts will be made to offer the patient care coordination information and services.   Encounter Outcome:  No Answer   Care Coordination Interventions:  No, not indicated    [name], BSW, CDP Social Worker, Certified Dementia Practitioner [facility]  Care Coordination [phone]

## 2022-04-15 ENCOUNTER — Ambulatory Visit: Payer: Self-pay

## 2022-04-15 NOTE — Patient Outreach (Signed)
  Care Coordination   Follow Up Visit Note   04/15/2022 Name: Lee Martinez MRN: [id number] DOB: 1936-01-21  Lee Martinez is a 86 y.o. year old male who sees [name], FNP for primary care. I  spoke with patients friend and caregiver Lee Martinez Martinez.  What matters to the patients health and wellness today?  To allow patient to remain in the home for as long as possible.    Goals Addressed             This Visit's Progress    COMPLETED: Care Coordination Activities       Care Coordination Interventions: Spoke with patients friend Lee Martinez Martinez to assess goal progression Lee Martinez reports they have submitted the Medicaid application but she and her friends feel the patient is doing well at home with increased supervision so they may keep him at home for now She is aware that eventually the patient will need placement she is just not sure now is the time Performed chart review to note patient has a future appointment to see his primary care provider in January - this is the same day as a dermatology appointment SW encouraged Lee Martinez to contact the patients primary care providers office to reschedule patients appointment if needed Collaboration with patients primary care provider to advise of status of placement and SW plan to sign off at this time as SW is no longer needed with plan for patient to remain in the home         SDOH assessments and interventions completed:  No     Care Coordination Interventions:  Yes, provided   Follow up plan: No further intervention required.   Encounter Outcome:  Pt. Visit Completed   [name], BSW, CDP Social Worker, Certified Dementia Practitioner [facility]  Care Coordination [phone]

## 2022-04-15 NOTE — Patient Instructions (Signed)
Visit Information  Thank you for taking time to visit with me today. Please don't hesitate to contact me if I can be of assistance to you.   Following are the goals we discussed today:   Goals Addressed             This Visit's Progress    COMPLETED: Care Coordination Activities       Care Coordination Interventions: Spoke with patients friend [name] to assess goal progression [name] reports they have submitted the Medicaid application but she and her friends feel the patient is doing well at home with increased supervision so they may keep him at home for now She is aware that eventually the patient will need placement she is just not sure now is the time Performed chart review to note patient has a future appointment to see his primary care provider in January - this is the same day as a dermatology appointment SW encouraged Mrs. Minor to contact the patients primary care providers office to reschedule patients appointment if needed Collaboration with patients primary care provider to advise of status of placement and SW plan to sign off at this time as SW is no longer needed with plan for patient to remain in the home         If you are experiencing a Mental Health or [place] or need someone to talk to, please call 1-800-273-TALK (toll free, 24 hour hotline) go to [facility] [phone])  The patient verbalized understanding of instructions, educational materials, and care plan provided today and DECLINED offer to receive copy of patient instructions, educational materials, and care plan.   No further follow up required: Please contact your primary care provider as needed.  [name], BSW, CDP Social Worker, Certified Dementia Practitioner [facility]  Care Coordination [phone]

## 2022-04-18 ENCOUNTER — Ambulatory Visit: Payer: Self-pay

## 2022-04-18 NOTE — Patient Outreach (Signed)
  Care Coordination   Collaboration  Visit Note   04/18/2022 Name: Lee Martinez MRN: [id number] DOB: 12/04/1935  Lee Martinez is a 86 y.o. year old male who sees [name], FNP for primary care. I  collaborated with Chauncy Lean with [facility].  What matters to the patients health and wellness today?  Patients safety in the home    Goals Addressed             This Visit's Progress    Care Coordination Activities       Care Coordination Interventions: Collaboration with patients primary care provider [name] FNP who advises an AOS letter has yet to be received at the clinic. Primary provider requests SW follow up on status of report  Determined the report was accepted and assigned to Chauncy Lean [phone] Discussed [name] is still investigating, has spoken with patient and [name] who indicates patient is being cared for by friends Reviewed patients neurologist suggested 24 hour care in September due to cognition and high fall risk Provided [name] with Neurologist name as well as health care POA name and number listed under [facility] Kristin Bruins [phone]) Discussed [name] will try to determine patients cognitive level regarding decision making capacity and assess for need for placement Collaboration with patients primary care provider advising of above         SDOH assessments and interventions completed:  No     Care Coordination Interventions:  Yes, provided   Follow up plan:  SW will continue to follow    Encounter Outcome:  Pt. Visit Completed   [name], [name], CDP Social Worker, Certified Dementia Practitioner [place] [facility] [phone]

## 2022-04-22 ENCOUNTER — Telehealth: Payer: Self-pay | Admitting: Diagnostic Neuroimaging

## 2022-04-22 MED ORDER — CARBIDOPA-LEVODOPA 25-100 MG PO TABS: 2.0000 | ORAL_TABLET | Freq: Three times a day (TID) | ORAL | 1 refills | Status: DC

## 2022-04-22 NOTE — Telephone Encounter (Signed)
Pt friend is calling. Requesting a refill on medication   carbidopa-levodopa (SINEMET IR) 25-100 MG tablet. Refill should be sent to [id number]   She is requesting a three month supply.

## 2022-04-22 NOTE — Telephone Encounter (Signed)
Refill has been sent for the pt to the pharmacy requested

## 2022-05-20 ENCOUNTER — Inpatient Hospital Stay (HOSPITAL_COMMUNITY)
Admission: EM | Admit: 2022-05-20 | Discharge: 2022-05-28 | DRG: 521 | Disposition: A | Discharge status: Skilled Nursing Facility | Payer: Medicare HMO | Attending: Internal Medicine | Admitting: Internal Medicine

## 2022-05-20 ENCOUNTER — Emergency Department (HOSPITAL_COMMUNITY): Payer: Medicare HMO

## 2022-05-20 DIAGNOSIS — Z681 Body mass index (BMI) 19 or less, adult: Secondary | ICD-10-CM

## 2022-05-20 DIAGNOSIS — E782 Mixed hyperlipidemia: Secondary | ICD-10-CM | POA: Diagnosis not present

## 2022-05-20 DIAGNOSIS — G9341 Metabolic encephalopathy: Secondary | ICD-10-CM | POA: Diagnosis not present

## 2022-05-20 DIAGNOSIS — D62 Acute posthemorrhagic anemia: Secondary | ICD-10-CM | POA: Diagnosis not present

## 2022-05-20 DIAGNOSIS — R42 Dizziness and giddiness: Secondary | ICD-10-CM | POA: Diagnosis not present

## 2022-05-20 DIAGNOSIS — E43 Unspecified severe protein-calorie malnutrition: Secondary | ICD-10-CM | POA: Diagnosis not present

## 2022-05-20 DIAGNOSIS — Z7401 Bed confinement status: Secondary | ICD-10-CM | POA: Diagnosis not present

## 2022-05-20 DIAGNOSIS — D638 Anemia in other chronic diseases classified elsewhere: Secondary | ICD-10-CM | POA: Diagnosis present

## 2022-05-20 DIAGNOSIS — F329 Major depressive disorder, single episode, unspecified: Secondary | ICD-10-CM | POA: Diagnosis present

## 2022-05-20 DIAGNOSIS — M899 Disorder of bone, unspecified: Secondary | ICD-10-CM

## 2022-05-20 DIAGNOSIS — I714 Abdominal aortic aneurysm, without rupture, unspecified: Secondary | ICD-10-CM | POA: Diagnosis present

## 2022-05-20 DIAGNOSIS — Z66 Do not resuscitate: Secondary | ICD-10-CM | POA: Diagnosis not present

## 2022-05-20 DIAGNOSIS — J189 Pneumonia, unspecified organism: Secondary | ICD-10-CM | POA: Diagnosis not present

## 2022-05-20 DIAGNOSIS — W109XXA Fall (on) (from) unspecified stairs and steps, initial encounter: Secondary | ICD-10-CM | POA: Diagnosis present

## 2022-05-20 DIAGNOSIS — N138 Other obstructive and reflux uropathy: Secondary | ICD-10-CM | POA: Diagnosis present

## 2022-05-20 DIAGNOSIS — Y92009 Unspecified place in unspecified non-institutional (private) residence as the place of occurrence of the external cause: Secondary | ICD-10-CM

## 2022-05-20 DIAGNOSIS — S42124A Nondisplaced fracture of acromial process, right shoulder, initial encounter for closed fracture: Secondary | ICD-10-CM | POA: Diagnosis present

## 2022-05-20 DIAGNOSIS — S3992XA Unspecified injury of lower back, initial encounter: Secondary | ICD-10-CM | POA: Diagnosis not present

## 2022-05-20 DIAGNOSIS — Z9181 History of falling: Secondary | ICD-10-CM | POA: Diagnosis not present

## 2022-05-20 DIAGNOSIS — Z471 Aftercare following joint replacement surgery: Secondary | ICD-10-CM | POA: Diagnosis not present

## 2022-05-20 DIAGNOSIS — M6281 Muscle weakness (generalized): Secondary | ICD-10-CM | POA: Diagnosis not present

## 2022-05-20 DIAGNOSIS — I499 Cardiac arrhythmia, unspecified: Secondary | ICD-10-CM | POA: Diagnosis not present

## 2022-05-20 DIAGNOSIS — F0390 Unspecified dementia without behavioral disturbance: Secondary | ICD-10-CM | POA: Diagnosis not present

## 2022-05-20 DIAGNOSIS — Z8249 Family history of ischemic heart disease and other diseases of the circulatory system: Secondary | ICD-10-CM | POA: Diagnosis not present

## 2022-05-20 DIAGNOSIS — IMO0001 Reserved for inherently not codable concepts without codable children: Secondary | ICD-10-CM

## 2022-05-20 DIAGNOSIS — Z79899 Other long term (current) drug therapy: Secondary | ICD-10-CM

## 2022-05-20 DIAGNOSIS — F0283 Dementia in other diseases classified elsewhere, unspecified severity, with mood disturbance: Secondary | ICD-10-CM | POA: Diagnosis present

## 2022-05-20 DIAGNOSIS — R41841 Cognitive communication deficit: Secondary | ICD-10-CM | POA: Diagnosis not present

## 2022-05-20 DIAGNOSIS — M79604 Pain in right leg: Secondary | ICD-10-CM | POA: Diagnosis not present

## 2022-05-20 DIAGNOSIS — Z743 Need for continuous supervision: Secondary | ICD-10-CM | POA: Diagnosis not present

## 2022-05-20 DIAGNOSIS — N401 Enlarged prostate with lower urinary tract symptoms: Secondary | ICD-10-CM | POA: Diagnosis present

## 2022-05-20 DIAGNOSIS — R410 Disorientation, unspecified: Secondary | ICD-10-CM | POA: Diagnosis not present

## 2022-05-20 DIAGNOSIS — D649 Anemia, unspecified: Secondary | ICD-10-CM | POA: Diagnosis not present

## 2022-05-20 DIAGNOSIS — G20A1 Parkinson's disease without dyskinesia, without mention of fluctuations: Secondary | ICD-10-CM | POA: Diagnosis not present

## 2022-05-20 DIAGNOSIS — Z96641 Presence of right artificial hip joint: Secondary | ICD-10-CM

## 2022-05-20 DIAGNOSIS — R079 Chest pain, unspecified: Secondary | ICD-10-CM | POA: Diagnosis not present

## 2022-05-20 DIAGNOSIS — W19XXXA Unspecified fall, initial encounter: Secondary | ICD-10-CM | POA: Diagnosis not present

## 2022-05-20 DIAGNOSIS — Z531 Procedure and treatment not carried out because of patient's decision for reasons of belief and group pressure: Secondary | ICD-10-CM

## 2022-05-20 DIAGNOSIS — R2681 Unsteadiness on feet: Secondary | ICD-10-CM | POA: Diagnosis not present

## 2022-05-20 DIAGNOSIS — S72001A Fracture of unspecified part of neck of right femur, initial encounter for closed fracture: Secondary | ICD-10-CM

## 2022-05-20 DIAGNOSIS — S42124K Nondisplaced fracture of acromial process, right shoulder, subsequent encounter for fracture with nonunion: Secondary | ICD-10-CM

## 2022-05-20 DIAGNOSIS — G319 Degenerative disease of nervous system, unspecified: Secondary | ICD-10-CM | POA: Diagnosis not present

## 2022-05-20 DIAGNOSIS — S72011A Unspecified intracapsular fracture of right femur, initial encounter for closed fracture: Principal | ICD-10-CM | POA: Diagnosis present

## 2022-05-20 DIAGNOSIS — M25551 Pain in right hip: Secondary | ICD-10-CM | POA: Diagnosis not present

## 2022-05-20 DIAGNOSIS — F919 Conduct disorder, unspecified: Secondary | ICD-10-CM | POA: Diagnosis present

## 2022-05-20 DIAGNOSIS — I6782 Cerebral ischemia: Secondary | ICD-10-CM | POA: Diagnosis not present

## 2022-05-20 DIAGNOSIS — R2689 Other abnormalities of gait and mobility: Secondary | ICD-10-CM | POA: Diagnosis not present

## 2022-05-20 DIAGNOSIS — S72041A Displaced fracture of base of neck of right femur, initial encounter for closed fracture: Secondary | ICD-10-CM | POA: Diagnosis not present

## 2022-05-20 DIAGNOSIS — G8911 Acute pain due to trauma: Secondary | ICD-10-CM | POA: Diagnosis not present

## 2022-05-20 DIAGNOSIS — R338 Other retention of urine: Secondary | ICD-10-CM | POA: Diagnosis not present

## 2022-05-20 DIAGNOSIS — R000 Tachycardia, unspecified: Secondary | ICD-10-CM | POA: Diagnosis not present

## 2022-05-20 DIAGNOSIS — S79929A Unspecified injury of unspecified thigh, initial encounter: Secondary | ICD-10-CM | POA: Diagnosis not present

## 2022-05-20 DIAGNOSIS — M898X8 Other specified disorders of bone, other site: Secondary | ICD-10-CM | POA: Diagnosis present

## 2022-05-20 DIAGNOSIS — R1312 Dysphagia, oropharyngeal phase: Secondary | ICD-10-CM | POA: Diagnosis not present

## 2022-05-20 DIAGNOSIS — S72091D Other fracture of head and neck of right femur, subsequent encounter for closed fracture with routine healing: Secondary | ICD-10-CM | POA: Diagnosis not present

## 2022-05-20 LAB — CBC WITH DIFFERENTIAL/PLATELET
Abs Immature Granulocytes: 0.04 10*3/uL (ref 0.00–0.07)
Basophils Absolute: 0.1 10*3/uL (ref 0.0–0.1)
Basophils Relative: 1 %
Eosinophils Absolute: 0 10*3/uL (ref 0.0–0.5)
Eosinophils Relative: 0 %
HCT: 40.7 % (ref 39.0–52.0)
Hemoglobin: 13.5 g/dL (ref 13.0–17.0)
Immature Granulocytes: 0 %
Lymphocytes Relative: 6 %
Lymphs Abs: 0.8 10*3/uL (ref 0.7–4.0)
MCH: 30.8 pg (ref 26.0–34.0)
MCHC: 33.2 g/dL (ref 30.0–36.0)
MCV: 92.9 fL (ref 80.0–100.0)
Monocytes Absolute: 1.2 10*3/uL — ABNORMAL HIGH (ref 0.1–1.0)
Monocytes Relative: 9 %
Neutro Abs: 10.9 10*3/uL — ABNORMAL HIGH (ref 1.7–7.7)
Neutrophils Relative %: 84 %
Platelets: 199 10*3/uL (ref 150–400)
RBC: 4.38 MIL/uL (ref 4.22–5.81)
RDW: 13.5 % (ref 11.5–15.5)
WBC: 13 10*3/uL — ABNORMAL HIGH (ref 4.0–10.5)
nRBC: 0 % (ref 0.0–0.2)

## 2022-05-20 LAB — PROTIME-INR
INR: 1.1 (ref 0.8–1.2)
Prothrombin Time: 14.4 seconds (ref 11.4–15.2)

## 2022-05-20 LAB — BASIC METABOLIC PANEL
Anion gap: 9 (ref 5–15)
BUN: 28 mg/dL — ABNORMAL HIGH (ref 8–23)
CO2: 23 mmol/L (ref 22–32)
Calcium: 9.3 mg/dL (ref 8.9–10.3)
Chloride: 105 mmol/L (ref 98–111)
Creatinine, Ser: 1.03 mg/dL (ref 0.61–1.24)
GFR, Estimated: >60 mL/min (ref >60)
Glucose, Bld: 133 mg/dL — ABNORMAL HIGH (ref 70–99)
Potassium: 3.9 mmol/L (ref 3.5–5.1)
Sodium: 137 mmol/L (ref 135–145)

## 2022-05-20 MED ORDER — MORPHINE SULFATE (PF) 2 MG/ML IV SOLN
0.5000 mg | INTRAVENOUS | Status: DC | PRN
  Administered 2022-05-20 – 2022-05-21 (×4): 0.5 mg via INTRAVENOUS
  Filled 2022-05-20 (×4): qty 1

## 2022-05-20 MED ORDER — FENTANYL CITRATE PF 50 MCG/ML IJ SOSY
50.0000 ug | PREFILLED_SYRINGE | Freq: Once | INTRAMUSCULAR | Status: AC
  Administered 2022-05-20: 50 ug via INTRAVENOUS
  Filled 2022-05-20: qty 1

## 2022-05-20 MED ORDER — CARBIDOPA-LEVODOPA 25-100 MG PO TABS
2.0000 | ORAL_TABLET | Freq: Three times a day (TID) | ORAL | Status: DC
  Administered 2022-05-20 – 2022-05-22 (×4): 2 via ORAL
  Filled 2022-05-20 (×5): qty 2

## 2022-05-20 MED ORDER — HYDROCODONE-ACETAMINOPHEN 5-325 MG PO TABS
1.0000 | ORAL_TABLET | Freq: Four times a day (QID) | ORAL | Status: DC | PRN
  Administered 2022-05-20: 2 via ORAL
  Filled 2022-05-20: qty 2

## 2022-05-20 NOTE — ED Triage Notes (Signed)
Ems brings pt in from home for right hip pain. Family states pt tripped and fell. Denies hitting head.

## 2022-05-20 NOTE — Assessment & Plan Note (Signed)
Continue home regimen of Sinemet

## 2022-05-20 NOTE — Assessment & Plan Note (Addendum)
Stable on x-rays during this hospitalization compared to 12/2021. Apparently this has been worked up in the distant past and it is felt not to be malignant.

## 2022-05-20 NOTE — ED Notes (Signed)
Patient transported to floor at this time.

## 2022-05-20 NOTE — Assessment & Plan Note (Addendum)
Son reports that patient injured his shoulder at least 4 months ago and was evaluated in the outpatient setting conservative nonoperative management was recommended at that time. present on imaging and Oct of 2023

## 2022-05-20 NOTE — Assessment & Plan Note (Addendum)
Right femoral neck fracture after mechanical fall.   Patient is status post successful right hip arthroplasty performed by Dr. [name] 1/9.  Patient now receiving aspirin 81 mg twice daily for DVT prophylaxis  Patient on scheduled Tylenol twice daily for pain control Patient additionally receiving as needed opiate-based analgesics for breakthrough pain  Patient has been evaluated by physical therapy and it is recommended the patient would benefit from skilled physical therapy services at a skilled nursing facility. Awaiting skilled nursing facility placement Jehovah's Witness, does not want blood transfusions. Vitamin D level 30.5.

## 2022-05-20 NOTE — H&P (Signed)
History and Physical    Patient: Lee Martinez HLK:[id number] DOB: 01/27/1936 DOA: 05/20/2022 DOS: the patient was seen and examined on 05/20/2022 PCP: [name], [place]  Patient coming from: Home  Chief Complaint:  Chief Complaint  Patient presents with   Hip Pain   HPI: Lee Martinez is a 87 y.o. male with medical history significant of Parkinson's dz.  Pt tripped and fell down a couple of stairs, injury to R hip, didn't hit head, no neck nor back pain, not on AC.    Review of Systems: As mentioned in the history of present illness. All other systems reviewed and are negative. Past Medical History:  Diagnosis Date   Parkinson's disease 07/2011   followed by [facility] Neurology   Past Surgical History:  Procedure Laterality Date   basil removal     HERNIA REPAIR     THYROID SURGERY     Social History:  reports that he has never smoked. He has never used smokeless tobacco. He reports that he does not currently use alcohol after a past usage of about 1.0 standard drink of alcohol per week. He reports that he does not use drugs.  No Known Allergies  Family History  Problem Relation Age of Onset   Heart failure Mother    Breast cancer Sister     Prior to Admission medications   Medication Sig Start Date End Date Taking? Authorizing Provider  acetaminophen (TYLENOL) 500 MG tablet Take 1 tablet (500 mg total) by mouth every 6 (six) hours as needed. 02/16/22   Jeanell Sparrow, DO  atorvastatin (LIPITOR) 10 MG tablet Take 1 tab daily in evening MWF 02/07/22   [name], FNP  calcium gluconate 500 MG tablet Take 1 tablet by mouth daily. Patient not taking: Reported on 11/08/2021    [provider]  carbidopa-levodopa (SINEMET IR) 25-100 MG tablet Take 2 tablets by mouth 3 (three) times daily. 04/22/22   Penumalli, Earlean Polka, MD  cholecalciferol (VITAMIN D) 1000 UNITS tablet Take 1,000 Units by mouth daily. Patient not taking: Reported on 11/08/2021    [provider]   doxycycline (MONODOX) 100 MG capsule Take 1 capsule (100 mg total) by mouth 2 (two) times daily. 02/20/22   [name], FNP  fish oil-omega-3 fatty acids 1000 MG capsule Take 2 g by mouth daily. Patient not taking: Reported on 11/08/2021    [provider]  magnesium 30 MG tablet Take 30 mg by mouth daily. Patient not taking: Reported on 11/08/2021    [provider]  Multiple Vitamin (MULTIVITAMIN) tablet Take 1 tablet by mouth daily. Patient not taking: Reported on 11/08/2021    [provider]  oxyCODONE (ROXICODONE) 5 MG immediate release tablet Take 0.5-1 tablets (2.5-5 mg total) by mouth every 4 (four) hours as needed for severe pain. 02/16/22   Jeanell Sparrow, DO  saw palmetto 160 MG capsule Take 160 mg by mouth 2 (two) times daily. Patient not taking: Reported on 11/08/2021    [provider]  sertraline (ZOLOFT) 50 MG tablet Take 1 tablet (50 mg total) by mouth daily. 02/07/22 02/07/23  [name], FNP    Physical Exam: Vitals:   05/20/22 1830 05/20/22 1945 05/20/22 2000 05/20/22 2030  BP: 134/72 113/74 102/61 101/71  Pulse: (!) 102 (!) 106 (!) 111 (!) 101  Resp: '16 16  16  '$ Temp:      SpO2: 96% 92% 95% 94%   Constitutional: NAD, calm, comfortable Eyes: PERRL, lids and  conjunctivae normal ENMT: Mucous membranes are moist. Posterior pharynx clear of any exudate or lesions.Normal dentition.  Neck: normal, supple, no masses, no thyromegaly Respiratory: clear to auscultation bilaterally, no wheezing, no crackles. Normal respiratory effort. No accessory muscle use.  Cardiovascular: Regular rate and rhythm, no murmurs / rubs / gallops. No extremity edema. 2+ pedal pulses. No carotid bruits.  Abdomen: no tenderness, no masses palpated. No hepatosplenomegaly. Bowel sounds positive.  Musculoskeletal: TTP R hip Skin: no rashes, lesions, ulcers. No induration Neurologic: CN 2-12 grossly intact. Sensation intact, DTR normal. Strength 5/5 in all 4.   Psychiatric: Normal judgment and insight. Alert and oriented x 3. Normal mood.   Data Reviewed:    CXR: IMPRESSION: 1. Nonunited fracture of the right acromion. 2. Heterogeneous density and lucency along the left medial shoulder near the junction with the neck, probably from clothing or artifact, less likely to be from soft tissue plane disruption related to local trauma. Correlate with any tenderness along the left lower neck at the junction with the shoulder. 3. Stable elevation of the right hemidiaphragm.   R hip X ray: IMPRESSION: Displaced and angulated subcapital femoral neck fracture.   Sclerotic lesions along the right proximal femur and the left iliac bone are stable from prior. Please correlate with prior workup. If needed a bone scan can be performed as clinically indicated.   CBC and CMP not really impressive.   Assessment and Plan: * Closed displaced fracture of right femoral neck (HCC) Hip fx pathway Pain control including IV opiates per pathway GUPTA score = 0.4% Note: pt is Jehova's witness, refuses blood transfusions.  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  Lesion of pelvic bone ? Bone cyst? Lesion is stable on todays X rays compared to 12/2021 CT in 12/2021 couldn't exclude metastatic dz But apparently x rayed or worked up in distant past and conclusion is that its just a "bone cyst" per prior EDP note.  Parkinson's disease Cont home sinemet      Advance Care Planning:   Code Status: DNR yellow form at bedside  Consults: Dr. Doran Durand  Family Communication: No family in room  Severity of Illness: The appropriate patient status for this patient is INPATIENT. Inpatient status is judged to be reasonable and necessary in order to provide the required intensity of service to ensure the patient's safety. The  patient's presenting symptoms, physical exam findings, and initial radiographic and laboratory data in the context of their chronic comorbidities is felt to place them at high risk for further clinical deterioration. Furthermore, it is not anticipated that the patient will be medically stable for discharge from the hospital within 2 midnights of admission.   * I certify that at the point of admission it is my clinical judgment that the patient will require inpatient hospital care spanning beyond 2 midnights from the point of admission due to high intensity of service, high risk for further deterioration and high frequency of surveillance required.*  Author: Etta Quill., DO 05/20/2022 8:58 PM  For on call review [email or web address].

## 2022-05-20 NOTE — Progress Notes (Signed)
Patient ID: [name], male   DOB: 09/20/1935, 87 y.o.   MRN: [id number]  87 y/o male with PMH of Parkinson's fell earlier today and has a displace right femoral neck fracture.  Consult note to follow.  NPO after midnight.  Please hold blood thinners.  Pt will need right hip hemi or total hip arthroplasty.  Timing to be determined.

## 2022-05-20 NOTE — ED Provider Notes (Signed)
[facility] DEPT Provider Note   CSN: [id number] Arrival date & time: 05/20/22  1651     History  Chief Complaint  Patient presents with   Hip Pain    Lee Martinez is a 87 y.o. male.  Patient is a 87 year old male who presents with pain in his right hip after a fall.  He states he has Parkinson's and tripped and fell down a couple of stairs.  He injured his right hip.  He denies any other complaints of pain.  He is fairly adamant that he did not hit his head.  He denies any neck or back pain.  He is not anticoagulants.       Home Medications Prior to Admission medications   Medication Sig Start Date End Date Taking? Authorizing Provider  acetaminophen (TYLENOL) 500 MG tablet Take 1 tablet (500 mg total) by mouth every 6 (six) hours as needed. 02/16/22   Jeanell Sparrow, DO  atorvastatin (LIPITOR) 10 MG tablet Take 1 tab daily in evening MWF 02/07/22   [name], FNP  calcium gluconate 500 MG tablet Take 1 tablet by mouth daily. Patient not taking: Reported on 11/08/2021    [provider]  carbidopa-levodopa (SINEMET IR) 25-100 MG tablet Take 2 tablets by mouth 3 (three) times daily. 04/22/22   Penumalli, Earlean Polka, MD  cholecalciferol (VITAMIN D) 1000 UNITS tablet Take 1,000 Units by mouth daily. Patient not taking: Reported on 11/08/2021    [provider]  doxycycline (MONODOX) 100 MG capsule Take 1 capsule (100 mg total) by mouth 2 (two) times daily. 02/20/22   [name], FNP  fish oil-omega-3 fatty acids 1000 MG capsule Take 2 g by mouth daily. Patient not taking: Reported on 11/08/2021    [provider]  magnesium 30 MG tablet Take 30 mg by mouth daily. Patient not taking: Reported on 11/08/2021    [provider]  Multiple Vitamin (MULTIVITAMIN) tablet Take 1 tablet by mouth daily. Patient not taking: Reported on 11/08/2021    [provider]  oxyCODONE (ROXICODONE) 5 MG immediate release tablet  Take 0.5-1 tablets (2.5-5 mg total) by mouth every 4 (four) hours as needed for severe pain. 02/16/22   Jeanell Sparrow, DO  saw palmetto 160 MG capsule Take 160 mg by mouth 2 (two) times daily. Patient not taking: Reported on 11/08/2021    [provider]  sertraline (ZOLOFT) 50 MG tablet Take 1 tablet (50 mg total) by mouth daily. 02/07/22 02/07/23  [name], FNP      Allergies    Patient has no known allergies.    Review of Systems   Review of Systems  Constitutional:  Negative for fever.  Gastrointestinal:  Negative for nausea and vomiting.  Musculoskeletal:  Positive for arthralgias. Negative for back pain, joint swelling and neck pain.  Skin:  Negative for wound.  Neurological:  Negative for weakness, numbness and headaches.    Physical Exam Updated Vital Signs BP 101/71   Pulse (!) 101   Temp 98.7 F (37.1 C)   Resp 16   SpO2 94%  Physical Exam Constitutional:      Appearance: He is well-developed.     Comments: Uncomfortable  HENT:     Head: Normocephalic and atraumatic.  Neck:     Comments: No pain to the cervical or thoracic spine.  There is some mild tenderness to the mid lumbosacral spine.  No step-offs or deformities. Cardiovascular:     Rate and  Rhythm: Normal rate.  Pulmonary:     Effort: Pulmonary effort is normal.  Musculoskeletal:        General: Tenderness present.     Cervical back: Normal range of motion and neck supple.     Comments: Positive pain on movement of the right hip.  There is no pain to the knee or ankle.  Pedal pulses are intact.  No pain on palpation of the other extremities.  Skin:    General: Skin is warm and dry.  Neurological:     Mental Status: He is alert and oriented to person, place, and time.     ED Results / Procedures / Treatments   Labs (all labs ordered are listed, but only abnormal results are displayed) Labs Reviewed  BASIC METABOLIC PANEL - Abnormal; Notable for the following components:      Result  Value   Glucose, Bld 133 (*)    BUN 28 (*)    All other components within normal limits  CBC WITH DIFFERENTIAL/PLATELET - Abnormal; Notable for the following components:   WBC 13.0 (*)    Neutro Abs 10.9 (*)    Monocytes Absolute 1.2 (*)    All other components within normal limits  PROTIME-INR  CBC  BASIC METABOLIC PANEL  TYPE AND SCREEN    EKG EKG Interpretation  Date/Time:  Monday May 20 2022 17:20:41 EST Ventricular Rate:  103 PR Interval:    QRS Duration: 101 QT Interval:  343 QTC Calculation: 449 R Axis:   -25 Text Interpretation: Atrial flutter vs artifact Borderline left axis deviation Consider inferior infarct Confirmed by Malvin Johns [phone]) on 05/20/2022 5:25:33 PM  Radiology DG Lumbar Spine Complete  Result Date: 05/20/2022 CLINICAL DATA:  Fall, right hip fracture. EXAM: LUMBAR SPINE - COMPLETE 4+ VIEW COMPARISON:  12/12/2021 FINDINGS: The right femoral neck fracture is visible on the oblique projections. Cross-table lateral views of the lumbar spine demonstrate no subluxation or definite fracture, posterior elements are obscured by presumed backboard or bedding material. IMPRESSION: 1. Right femoral neck fracture. 2. No lumbar spine fracture or subluxation identified. Electronically Signed   By: Van Clines M.D.   On: 05/20/2022 18:36   DG Chest 1 View  Result Date: 05/20/2022 CLINICAL DATA:  Fall, right hip pain EXAM: CHEST  1 VIEW COMPARISON:  02/16/2022 FINDINGS: Nonunited fracture of the right acromion. Heterogeneous density and lucency along the left medial shoulder near the junction with the neck, probably from clothing or artifact, less likely to be from soft tissue plane disruption related to local trauma. I do not see a definite adjacent scapular, left clavicular, or left rib fracture to further explain this soft tissue appearance. Correlate with any tenderness along the left lower neck at the junction with the shoulder. Mildly elevated right  hemidiaphragm.  The lungs appear clear. IMPRESSION: 1. Nonunited fracture of the right acromion. 2. Heterogeneous density and lucency along the left medial shoulder near the junction with the neck, probably from clothing or artifact, less likely to be from soft tissue plane disruption related to local trauma. Correlate with any tenderness along the left lower neck at the junction with the shoulder. 3. Stable elevation of the right hemidiaphragm. Electronically Signed   By: Van Clines M.D.   On: 05/20/2022 18:34   DG Hip Unilat W or Wo Pelvis 2-3 Views Right  Result Date: 05/20/2022 CLINICAL DATA:  Pain after fall EXAM: DG HIP (WITH OR WITHOUT PELVIS) 3V RIGHT COMPARISON:  X-ray 12/12/2021 FINDINGS: Displaced and  angulated subcapital femoral neck fracture on the right. Osteopenia. No additional fracture or dislocation. Hyperostosis. Surgical mesh along the pelvis. Scattered colonic stool. There is a sclerotic lesion along the proximal femur along the greater trochanter. Possibilities include a bone island. This is unchanged from prior x-ray. Subtle area of sclerosis along the posterosuperior left iliac bone is also stable from prior exams. Please correlate with prior workup. IMPRESSION: Displaced and angulated subcapital femoral neck fracture. Sclerotic lesions along the right proximal femur and the left iliac bone are stable from prior. Please correlate with prior workup. If needed a bone scan can be performed as clinically indicated. Electronically Signed   By: Jill Side M.D.   On: 05/20/2022 18:33    Procedures Procedures    Medications Ordered in ED Medications  HYDROcodone-acetaminophen (NORCO/VICODIN) 5-325 MG per tablet 1-2 tablet (has no administration in time range)  morphine (PF) 2 MG/ML injection 0.5 mg (has no administration in time range)  carbidopa-levodopa (SINEMET IR) 25-100 MG per tablet immediate release 2 tablet (has no administration in time range)  fentaNYL (SUBLIMAZE)  injection 50 mcg (50 mcg Intravenous Given 05/20/22 1753)  fentaNYL (SUBLIMAZE) injection 50 mcg (50 mcg Intravenous Given 05/20/22 1931)    ED Course/ Medical Decision Making/ A&P                           Medical Decision Making Amount and/or Complexity of Data Reviewed Labs: ordered. Radiology: ordered.  Risk Prescription drug management. Decision regarding hospitalization.   Patient is a 87 year old male who presents with hip pain after a fall.  X-rays reveal evidence of a right subcapital hip fracture.  X-ray of his lumbar spine show no evidence of acute abnormalities.  No fractures.  These were interpreted by me and confirmed by the radiologist.  Chest x-ray 1 view was performed.  There is no evidence of acute cardiopulmonary abnormality.  There is a nonunited fracture in the right shoulder.  Patient does not have any acute pain to this area and likely is old.  His labs reviewed and are nonconcerning.  He is not on anticoagulants.  I spoke with Dr. Doran Durand with orthopedic surgery.  He advises surgery would likely done tomorrow.  Advises to keep patient n.p.o. after midnight and avoid anticoagulants.  Patient also advises that he is a Sales promotion account executive Witness and will not accept any blood transfusion.  He also has a DNR paper at bedside.  I spoke with Dr. Alcario Drought who will admit the patient for further treatment.  Final Clinical Impression(s) / ED Diagnoses Final diagnoses:  Closed fracture of right hip, initial encounter [facility])    Rx / [place] Orders ED Discharge Orders     None         Malvin Johns, MD 05/20/22 2035

## 2022-05-20 NOTE — ED Notes (Signed)
ED TO INPATIENT HANDOFF REPORT  Name/Age/Gender Lee Martinez 87 y.o. male  Code Status    Code Status Orders  (From admission, onward)           Start     Ordered   05/20/22 2030  Do not attempt resuscitation (DNR)  Continuous       Question Answer Comment  If patient has no pulse and is not breathing Do Not Attempt Resuscitation   If patient has a pulse and/or is breathing: Medical Treatment Goals MEDICAL INTERVENTIONS DESIRED: Use advanced airway interventions, mechanical ventilation or cardioversion in appropriate circumstances; Use medication/IV fluids as indicated; Provide comfort medications; Transfer to Progressive/Stepdown/ICU as indicated.   Consent: Discussion documented in EHR or advanced directives reviewed      05/20/22 2030           Code Status History     This patient has a current code status but no historical code status.       Home/SNF/Other Rehab, Currently lives at home.  Dispo pending  Chief Complaint Closed displaced fracture of right femoral neck ([place]) [S72.001A]  Level of Care/Admitting Diagnosis ED Disposition     ED Disposition  Admit   Condition  --   [place]: [place] [[id number]]  Level of Care: Med-Surg [16]  May admit patient to [facility] or [facility] if equivalent level of care is available:: No  Covid Evaluation: Asymptomatic - no recent exposure (last 10 days) testing not required  Diagnosis: Closed displaced fracture of right femoral neck [facility]) [[id number]]  Admitting Physician: Etta Quill [[id number]]  Attending Physician: Etta Quill [[id number]]  Certification:: I certify this patient will need inpatient services for at least 2 midnights  Estimated Length of Stay: 5          Medical History Past Medical History:  Diagnosis Date   Parkinson's disease 07/2011   followed by [facility]    Allergies No Known Allergies  IV Location/Drains/Wounds Patient  Lines/Drains/Airways Status     Active Line/Drains/Airways     Name Placement date Placement time Site Days   Peripheral IV 05/20/22 18 G 1.16" Right;Posterior Forearm 05/20/22  1753  Forearm  less than 1            Labs/Imaging Results for orders placed or performed during the hospital encounter of 05/20/22 (from the past 48 hour(s))  Basic metabolic panel     Status: Abnormal   Collection Time: 05/20/22  7:40 PM  Result Value Ref Range   Sodium 137 135 - 145 mmol/L   Potassium 3.9 3.5 - 5.1 mmol/L   Chloride 105 98 - 111 mmol/L   CO2 23 22 - 32 mmol/L   Glucose, Bld 133 (H) 70 - 99 mg/dL    Comment: Glucose reference range applies only to samples taken after fasting for at least 8 hours.   BUN 28 (H) 8 - 23 mg/dL   Creatinine, Ser 1.03 0.61 - 1.24 mg/dL   Calcium 9.3 8.9 - 10.3 mg/dL   GFR, Estimated >60 >60 mL/min    Comment: (NOTE) Calculated using the CKD-EPI Creatinine Equation (2021)    Anion gap 9 5 - 15    Comment: Performed at [facility], [place] [address] [place], [place] [address]  CBC with Differential     Status: Abnormal   Collection Time: 05/20/22  7:40 PM  Result Value Ref Range   WBC 13.0 (H) 4.0 - 10.5 K/uL  RBC 4.38 4.22 - 5.81 MIL/uL   Hemoglobin 13.5 13.0 - 17.0 g/dL   HCT 40.7 39.0 - 52.0 %   MCV 92.9 80.0 - 100.0 fL   MCH 30.8 26.0 - 34.0 pg   MCHC 33.2 30.0 - 36.0 g/dL   RDW 13.5 11.5 - 15.5 %   Platelets 199 150 - 400 K/uL   nRBC 0.0 0.0 - 0.2 %   Neutrophils Relative % 84 %   Neutro Abs 10.9 (H) 1.7 - 7.7 K/uL   Lymphocytes Relative 6 %   Lymphs Abs 0.8 0.7 - 4.0 K/uL   Monocytes Relative 9 %   Monocytes Absolute 1.2 (H) 0.1 - 1.0 K/uL   Eosinophils Relative 0 %   Eosinophils Absolute 0.0 0.0 - 0.5 K/uL   Basophils Relative 1 %   Basophils Absolute 0.1 0.0 - 0.1 K/uL   Immature Granulocytes 0 %   Abs Immature Granulocytes 0.04 0.00 - 0.07 K/uL    Comment: Performed at [facility], [place]  [address] [place], [place] [address]  Protime-INR     Status: None   Collection Time: 05/20/22  7:40 PM  Result Value Ref Range   Prothrombin Time 14.4 11.4 - 15.2 seconds   INR 1.1 0.8 - 1.2    Comment: (NOTE) INR goal varies based on device and disease states. Performed at [facility], [place] [address] [place], [place] [address]   Type and screen [place]     Status: None   Collection Time: 05/20/22  7:40 PM  Result Value Ref Range   ABO/RH(D) O POS    Antibody Screen NEG    Sample Expiration      05/23/2022,2359 Performed at [facility], [place] [address], [place], [place] [address]    DG Lumbar Spine Complete  Result Date: 05/20/2022 CLINICAL DATA:  Fall, right hip fracture. EXAM: LUMBAR SPINE - COMPLETE 4+ VIEW COMPARISON:  12/12/2021 FINDINGS: The right femoral neck fracture is visible on the oblique projections. Cross-table lateral views of the lumbar spine demonstrate no subluxation or definite fracture, posterior elements are obscured by presumed backboard or bedding material. IMPRESSION: 1. Right femoral neck fracture. 2. No lumbar spine fracture or subluxation identified. Electronically Signed   By: Van Clines M.D.   On: 05/20/2022 18:36   DG Chest 1 View  Result Date: 05/20/2022 CLINICAL DATA:  Fall, right hip pain EXAM: CHEST  1 VIEW COMPARISON:  02/16/2022 FINDINGS: Nonunited fracture of the right acromion. Heterogeneous density and lucency along the left medial shoulder near the junction with the neck, probably from clothing or artifact, less likely to be from soft tissue plane disruption related to local trauma. I do not see a definite adjacent scapular, left clavicular, or left rib fracture to further explain this soft tissue appearance. Correlate with any tenderness along the left lower neck at the junction with the shoulder. Mildly elevated right hemidiaphragm.  The lungs appear clear. IMPRESSION: 1.  Nonunited fracture of the right acromion. 2. Heterogeneous density and lucency along the left medial shoulder near the junction with the neck, probably from clothing or artifact, less likely to be from soft tissue plane disruption related to local trauma. Correlate with any tenderness along the left lower neck at the junction with the shoulder. 3. Stable elevation of the right hemidiaphragm. Electronically Signed   By: Van Clines M.D.   On: 05/20/2022 18:34   DG Hip Unilat W or Wo Pelvis 2-3 Views  Right  Result Date: 05/20/2022 CLINICAL DATA:  Pain after fall EXAM: DG HIP (WITH OR WITHOUT PELVIS) 3V RIGHT COMPARISON:  X-ray 12/12/2021 FINDINGS: Displaced and angulated subcapital femoral neck fracture on the right. Osteopenia. No additional fracture or dislocation. Hyperostosis. Surgical mesh along the pelvis. Scattered colonic stool. There is a sclerotic lesion along the proximal femur along the greater trochanter. Possibilities include a bone island. This is unchanged from prior x-ray. Subtle area of sclerosis along the posterosuperior left iliac bone is also stable from prior exams. Please correlate with prior workup. IMPRESSION: Displaced and angulated subcapital femoral neck fracture. Sclerotic lesions along the right proximal femur and the left iliac bone are stable from prior. Please correlate with prior workup. If needed a bone scan can be performed as clinically indicated. Electronically Signed   By: Jill Side M.D.   On: 05/20/2022 18:33    Pending Labs Unresulted Labs (From admission, onward)     Start     Ordered   05/21/22 0500  CBC  Tomorrow morning,   R        05/20/22 2030   05/21/22 4967  Basic metabolic panel  Tomorrow morning,   R        05/20/22 2030   05/20/22 2100  ABO/Rh  Once,   R        05/20/22 2100            Vitals/Pain Today's Vitals   05/20/22 2000 05/20/22 2030 05/20/22 2100 05/20/22 2115  BP: 102/61 101/71 104/82 102/71  Pulse: (!) 111 (!) 101 (!)  107   Resp:  16 17 (!) 22  Temp:      SpO2: 95% 94% 94%   PainSc:        Isolation Precautions No active isolations  Medications Medications  HYDROcodone-acetaminophen (NORCO/VICODIN) 5-325 MG per tablet 1-2 tablet (has no administration in time range)  morphine (PF) 2 MG/ML injection 0.5 mg (has no administration in time range)  carbidopa-levodopa (SINEMET IR) 25-100 MG per tablet immediate release 2 tablet (has no administration in time range)  fentaNYL (SUBLIMAZE) injection 50 mcg (50 mcg Intravenous Given 05/20/22 1753)  fentaNYL (SUBLIMAZE) injection 50 mcg (50 mcg Intravenous Given 05/20/22 1931)    Mobility non-ambulatory

## 2022-05-21 ENCOUNTER — Inpatient Hospital Stay (HOSPITAL_COMMUNITY): Payer: Medicare HMO | Admitting: Certified Registered"

## 2022-05-21 ENCOUNTER — Other Ambulatory Visit: Payer: Self-pay

## 2022-05-21 ENCOUNTER — Inpatient Hospital Stay (HOSPITAL_COMMUNITY): Payer: Medicare HMO

## 2022-05-21 ENCOUNTER — Encounter (HOSPITAL_COMMUNITY): Payer: Self-pay | Admitting: Internal Medicine

## 2022-05-21 ENCOUNTER — Inpatient Hospital Stay (HOSPITAL_COMMUNITY): Payer: Medicare HMO | Admitting: Anesthesiology

## 2022-05-21 ENCOUNTER — Encounter (HOSPITAL_COMMUNITY)
Admission: EM | Disposition: A | Discharge status: Skilled Nursing Facility | Payer: Self-pay | Source: Home / Self Care | Attending: Internal Medicine

## 2022-05-21 ENCOUNTER — Ambulatory Visit: Payer: Self-pay

## 2022-05-21 DIAGNOSIS — Z96641 Presence of right artificial hip joint: Secondary | ICD-10-CM

## 2022-05-21 DIAGNOSIS — S72001A Fracture of unspecified part of neck of right femur, initial encounter for closed fracture: Secondary | ICD-10-CM

## 2022-05-21 DIAGNOSIS — E43 Unspecified severe protein-calorie malnutrition: Secondary | ICD-10-CM | POA: Insufficient documentation

## 2022-05-21 HISTORY — PX: TOTAL HIP ARTHROPLASTY: SHX124

## 2022-05-21 LAB — CBC
HCT: 42.2 % (ref 39.0–52.0)
Hemoglobin: 13.8 g/dL (ref 13.0–17.0)
MCH: 31.6 pg (ref 26.0–34.0)
MCHC: 32.7 g/dL (ref 30.0–36.0)
MCV: 96.6 fL (ref 80.0–100.0)
Platelets: 188 10*3/uL (ref 150–400)
RBC: 4.37 MIL/uL (ref 4.22–5.81)
RDW: 13.9 % (ref 11.5–15.5)
WBC: 9.9 10*3/uL (ref 4.0–10.5)
nRBC: 0 % (ref 0.0–0.2)

## 2022-05-21 LAB — BASIC METABOLIC PANEL
Anion gap: 8 (ref 5–15)
BUN: 29 mg/dL — ABNORMAL HIGH (ref 8–23)
CO2: 24 mmol/L (ref 22–32)
Calcium: 9.1 mg/dL (ref 8.9–10.3)
Chloride: 106 mmol/L (ref 98–111)
Creatinine, Ser: 1.01 mg/dL (ref 0.61–1.24)
GFR, Estimated: >60 mL/min (ref >60)
Glucose, Bld: 134 mg/dL — ABNORMAL HIGH (ref 70–99)
Potassium: 4.1 mmol/L (ref 3.5–5.1)
Sodium: 138 mmol/L (ref 135–145)

## 2022-05-21 LAB — TYPE AND SCREEN
ABO/RH(D): O POS
Antibody Screen: NEGATIVE

## 2022-05-21 LAB — NO BLOOD PRODUCTS

## 2022-05-21 LAB — SURGICAL PCR SCREEN
MRSA, PCR: NEGATIVE
Staphylococcus aureus: NEGATIVE

## 2022-05-21 LAB — ABO/RH: ABO/RH(D): O POS

## 2022-05-21 SURGERY — ARTHROPLASTY, HIP, TOTAL, ANTERIOR APPROACH
Anesthesia: General | Site: Hip | Laterality: Right

## 2022-05-21 MED ORDER — CHLORHEXIDINE GLUCONATE 0.12 % MT SOLN: 15.0000 mL | Freq: Once | OROMUCOSAL | Status: DC

## 2022-05-21 MED ORDER — FENTANYL CITRATE PF 50 MCG/ML IJ SOSY
PREFILLED_SYRINGE | INTRAMUSCULAR | Status: AC
  Filled 2022-05-21: qty 2

## 2022-05-21 MED ORDER — SODIUM CHLORIDE (PF) 0.9 % IJ SOLN
INTRAMUSCULAR | Status: AC
  Filled 2022-05-21: qty 30

## 2022-05-21 MED ORDER — ONDANSETRON HCL 4 MG/2ML IJ SOLN
INTRAMUSCULAR | Status: DC | PRN
  Administered 2022-05-21: 4 mg via INTRAVENOUS

## 2022-05-21 MED ORDER — SERTRALINE HCL 50 MG PO TABS
50.0000 mg | ORAL_TABLET | Freq: Every day | ORAL | Status: DC
  Administered 2022-05-22 – 2022-05-28 (×6): 50 mg via ORAL
  Filled 2022-05-21 (×7): qty 1

## 2022-05-21 MED ORDER — DOCUSATE SODIUM 100 MG PO CAPS
100.0000 mg | ORAL_CAPSULE | Freq: Two times a day (BID) | ORAL | Status: DC
  Administered 2022-05-21 – 2022-05-28 (×12): 100 mg via ORAL
  Filled 2022-05-21 (×14): qty 1

## 2022-05-21 MED ORDER — FENTANYL CITRATE (PF) 250 MCG/5ML IJ SOLN: INTRAMUSCULAR | Status: DC | PRN

## 2022-05-21 MED ORDER — 0.9 % SODIUM CHLORIDE (POUR BTL) OPTIME
TOPICAL | Status: DC | PRN
  Administered 2022-05-21: 1000 mL

## 2022-05-21 MED ORDER — EPINEPHRINE PF 1 MG/ML IJ SOLN
INTRAMUSCULAR | Status: AC
  Filled 2022-05-21: qty 1

## 2022-05-21 MED ORDER — DIPHENHYDRAMINE HCL 12.5 MG/5ML PO ELIX
12.5000 mg | ORAL_SOLUTION | ORAL | Status: DC | PRN
  Administered 2022-05-23: 25 mg via ORAL
  Filled 2022-05-21: qty 10

## 2022-05-21 MED ORDER — TRANEXAMIC ACID-NACL 1000-0.7 MG/100ML-% IV SOLN: 1000.0000 mg | Freq: Once | INTRAVENOUS | Status: DC

## 2022-05-21 MED ORDER — ROCURONIUM BROMIDE 10 MG/ML (PF) SYRINGE
PREFILLED_SYRINGE | INTRAVENOUS | Status: DC | PRN
  Administered 2022-05-21: 60 mg via INTRAVENOUS

## 2022-05-21 MED ORDER — BUPIVACAINE-EPINEPHRINE (PF) 0.25% -1:200000 IJ SOLN
INTRAMUSCULAR | Status: DC | PRN
  Administered 2022-05-21: 30 mL

## 2022-05-21 MED ORDER — METHOCARBAMOL 500 MG PO TABS
500.0000 mg | ORAL_TABLET | Freq: Four times a day (QID) | ORAL | Status: DC | PRN
  Administered 2022-05-24 – 2022-05-27 (×2): 500 mg via ORAL
  Filled 2022-05-21 (×3): qty 1

## 2022-05-21 MED ORDER — METHOCARBAMOL 500 MG IVPB - SIMPLE MED
500.0000 mg | Freq: Four times a day (QID) | INTRAVENOUS | Status: DC | PRN
  Administered 2022-05-21: 500 mg via INTRAVENOUS

## 2022-05-21 MED ORDER — HYDROCODONE-ACETAMINOPHEN 7.5-325 MG PO TABS
1.0000 | ORAL_TABLET | ORAL | Status: DC | PRN
  Administered 2022-05-22: 2 via ORAL
  Filled 2022-05-21: qty 2

## 2022-05-21 MED ORDER — SUGAMMADEX SODIUM 200 MG/2ML IV SOLN
INTRAVENOUS | Status: DC | PRN
  Administered 2022-05-21: 250 mg via INTRAVENOUS

## 2022-05-21 MED ORDER — METHOCARBAMOL 500 MG IVPB - SIMPLE MED
INTRAVENOUS | Status: AC
  Filled 2022-05-21: qty 55

## 2022-05-21 MED ORDER — CEFAZOLIN SODIUM-DEXTROSE 2-4 GM/100ML-% IV SOLN: 2.0000 g | INTRAVENOUS | Status: DC

## 2022-05-21 MED ORDER — CHLORHEXIDINE GLUCONATE 4 % EX LIQD: 60.0000 mL | Freq: Once | CUTANEOUS | Status: DC

## 2022-05-21 MED ORDER — LIDOCAINE 2% (20 MG/ML) 5 ML SYRINGE
INTRAMUSCULAR | Status: DC | PRN
  Administered 2022-05-21: 80 mg via INTRAVENOUS

## 2022-05-21 MED ORDER — KETOROLAC TROMETHAMINE 30 MG/ML IJ SOLN
INTRAMUSCULAR | Status: DC | PRN
  Administered 2022-05-21: 30 mg

## 2022-05-21 MED ORDER — POVIDONE-IODINE 10 % EX SWAB: 2.0000 | Freq: Once | CUTANEOUS | Status: DC

## 2022-05-21 MED ORDER — STERILE WATER FOR IRRIGATION IR SOLN
Status: DC | PRN
  Administered 2022-05-21: 2000 mL

## 2022-05-21 MED ORDER — MORPHINE SULFATE (PF) 2 MG/ML IV SOLN
0.5000 mg | INTRAVENOUS | Status: DC | PRN
  Administered 2022-05-21: 1 mg via INTRAVENOUS
  Filled 2022-05-21: qty 1

## 2022-05-21 MED ORDER — FENTANYL CITRATE PF 50 MCG/ML IJ SOSY
25.0000 ug | PREFILLED_SYRINGE | Freq: Once | INTRAMUSCULAR | Status: AC
  Administered 2022-05-21: 50 ug via INTRAVENOUS

## 2022-05-21 MED ORDER — PHENYLEPHRINE 80 MCG/ML (10ML) SYRINGE FOR IV PUSH (FOR BLOOD PRESSURE SUPPORT)
PREFILLED_SYRINGE | INTRAVENOUS | Status: DC | PRN
  Administered 2022-05-21 (×2): 160 ug via INTRAVENOUS

## 2022-05-21 MED ORDER — ACETAMINOPHEN 10 MG/ML IV SOLN
INTRAVENOUS | Status: AC
  Filled 2022-05-21: qty 100

## 2022-05-21 MED ORDER — PROPOFOL 10 MG/ML IV BOLUS
INTRAVENOUS | Status: DC | PRN
  Administered 2022-05-21: 70 mg via INTRAVENOUS

## 2022-05-21 MED ORDER — ROPIVACAINE HCL 5 MG/ML IJ SOLN
INTRAMUSCULAR | Status: DC | PRN
  Administered 2022-05-21: 20 mL via PERINEURAL

## 2022-05-21 MED ORDER — POLYETHYLENE GLYCOL 3350 17 G PO PACK
17.0000 g | PACK | Freq: Two times a day (BID) | ORAL | Status: DC
  Administered 2022-05-21 – 2022-05-28 (×11): 17 g via ORAL
  Filled 2022-05-21 (×12): qty 1

## 2022-05-21 MED ORDER — BUPIVACAINE HCL (PF) 0.25 % IJ SOLN
INTRAMUSCULAR | Status: AC
  Filled 2022-05-21: qty 30

## 2022-05-21 MED ORDER — ONDANSETRON HCL 4 MG/2ML IJ SOLN: 4.0000 mg | Freq: Once | INTRAMUSCULAR | Status: DC | PRN

## 2022-05-21 MED ORDER — FENTANYL CITRATE (PF) 100 MCG/2ML IJ SOLN
INTRAMUSCULAR | Status: DC | PRN
  Administered 2022-05-21: 50 ug via INTRAVENOUS
  Administered 2022-05-21 (×2): 25 ug via INTRAVENOUS

## 2022-05-21 MED ORDER — BISACODYL 10 MG RE SUPP
10.0000 mg | Freq: Every day | RECTAL | Status: DC | PRN
  Administered 2022-05-23: 10 mg via RECTAL
  Filled 2022-05-21: qty 1

## 2022-05-21 MED ORDER — FENTANYL CITRATE (PF) 100 MCG/2ML IJ SOLN
INTRAMUSCULAR | Status: AC
  Filled 2022-05-21: qty 2

## 2022-05-21 MED ORDER — CEFAZOLIN SODIUM-DEXTROSE 2-4 GM/100ML-% IV SOLN
2.0000 g | Freq: Four times a day (QID) | INTRAVENOUS | Status: AC
  Administered 2022-05-21 – 2022-05-22 (×2): 2 g via INTRAVENOUS
  Filled 2022-05-21 (×2): qty 100

## 2022-05-21 MED ORDER — TRANEXAMIC ACID-NACL 1000-0.7 MG/100ML-% IV SOLN
1000.0000 mg | INTRAVENOUS | Status: AC
  Administered 2022-05-21: 1000 mg via INTRAVENOUS
  Filled 2022-05-21: qty 100

## 2022-05-21 MED ORDER — CEFAZOLIN SODIUM-DEXTROSE 2-4 GM/100ML-% IV SOLN
2.0000 g | INTRAVENOUS | Status: AC
  Administered 2022-05-21: 2 g via INTRAVENOUS
  Filled 2022-05-21: qty 100

## 2022-05-21 MED ORDER — PHENYLEPHRINE HCL-NACL 20-0.9 MG/250ML-% IV SOLN
INTRAVENOUS | Status: DC | PRN
  Administered 2022-05-21: 25 ug/min via INTRAVENOUS

## 2022-05-21 MED ORDER — SODIUM CHLORIDE 0.9 % IV SOLN: INTRAVENOUS | Status: DC

## 2022-05-21 MED ORDER — PHENOL 1.4 % MT LIQD: 1.0000 | OROMUCOSAL | Status: DC | PRN

## 2022-05-21 MED ORDER — FENTANYL CITRATE PF 50 MCG/ML IJ SOSY
PREFILLED_SYRINGE | INTRAMUSCULAR | Status: AC
  Filled 2022-05-21: qty 1

## 2022-05-21 MED ORDER — FENTANYL CITRATE PF 50 MCG/ML IJ SOSY
25.0000 ug | PREFILLED_SYRINGE | INTRAMUSCULAR | Status: DC | PRN
  Administered 2022-05-21: 25 ug via INTRAVENOUS

## 2022-05-21 MED ORDER — ASPIRIN 81 MG PO CHEW
81.0000 mg | CHEWABLE_TABLET | Freq: Two times a day (BID) | ORAL | Status: DC
  Administered 2022-05-21 – 2022-05-28 (×13): 81 mg via ORAL
  Filled 2022-05-21 (×14): qty 1

## 2022-05-21 MED ORDER — ACETAMINOPHEN 10 MG/ML IV SOLN
INTRAVENOUS | Status: DC | PRN
  Administered 2022-05-21: 1000 mg via INTRAVENOUS

## 2022-05-21 MED ORDER — LACTATED RINGERS IV SOLN: INTRAVENOUS | Status: DC

## 2022-05-21 MED ORDER — SODIUM CHLORIDE (PF) 0.9 % IJ SOLN
INTRAMUSCULAR | Status: DC | PRN
  Administered 2022-05-21: 30 mL

## 2022-05-21 MED ORDER — ONDANSETRON HCL 4 MG PO TABS: 4.0000 mg | ORAL_TABLET | Freq: Four times a day (QID) | ORAL | Status: DC | PRN

## 2022-05-21 MED ORDER — ORAL CARE MOUTH RINSE: 15.0000 mL | Freq: Once | OROMUCOSAL | Status: DC

## 2022-05-21 MED ORDER — KETOROLAC TROMETHAMINE 30 MG/ML IJ SOLN
INTRAMUSCULAR | Status: AC
  Filled 2022-05-21: qty 1

## 2022-05-21 MED ORDER — ACETAMINOPHEN 325 MG PO TABS
325.0000 mg | ORAL_TABLET | Freq: Four times a day (QID) | ORAL | Status: DC | PRN
  Administered 2022-05-23 – 2022-05-28 (×5): 650 mg via ORAL
  Filled 2022-05-21 (×6): qty 2

## 2022-05-21 MED ORDER — ONDANSETRON HCL 4 MG/2ML IJ SOLN: 4.0000 mg | Freq: Four times a day (QID) | INTRAMUSCULAR | Status: DC | PRN

## 2022-05-21 MED ORDER — TRANEXAMIC ACID-NACL 1000-0.7 MG/100ML-% IV SOLN: 1000.0000 mg | INTRAVENOUS | Status: DC

## 2022-05-21 MED ORDER — DEXAMETHASONE SODIUM PHOSPHATE 10 MG/ML IJ SOLN
10.0000 mg | Freq: Once | INTRAMUSCULAR | Status: AC
  Administered 2022-05-22: 10 mg via INTRAVENOUS
  Filled 2022-05-21: qty 1

## 2022-05-21 MED ORDER — METOCLOPRAMIDE HCL 5 MG/ML IJ SOLN: 5.0000 mg | Freq: Three times a day (TID) | INTRAMUSCULAR | Status: DC | PRN

## 2022-05-21 MED ORDER — DEXAMETHASONE SODIUM PHOSPHATE 10 MG/ML IJ SOLN
INTRAMUSCULAR | Status: DC | PRN
  Administered 2022-05-21: 10 mg via INTRAVENOUS

## 2022-05-21 MED ORDER — MENTHOL 3 MG MT LOZG: 1.0000 | LOZENGE | OROMUCOSAL | Status: DC | PRN

## 2022-05-21 MED ORDER — HYDROCODONE-ACETAMINOPHEN 5-325 MG PO TABS: 1.0000 | ORAL_TABLET | ORAL | Status: DC | PRN

## 2022-05-21 MED ORDER — METOCLOPRAMIDE HCL 5 MG PO TABS: 5.0000 mg | ORAL_TABLET | Freq: Three times a day (TID) | ORAL | Status: DC | PRN

## 2022-05-21 SURGICAL SUPPLY — 42 items
ADH SKN CLS APL DERMABOND .7 (GAUZE/BANDAGES/DRESSINGS) ×1
ARTICULEZE HEAD (Hips) ×1 IMPLANT
BAG COUNTER SPONGE SURGICOUNT (BAG) IMPLANT
BAG DECANTER FOR FLEXI CONT (MISCELLANEOUS) IMPLANT
BAG SPEC THK2 15X12 ZIP CLS (MISCELLANEOUS)
BAG SPNG CNTER NS LX DISP (BAG)
BAG ZIPLOCK 12X15 (MISCELLANEOUS) IMPLANT
BLADE SAG 18X100X1.27 (BLADE) ×1 IMPLANT
COVER PERINEAL POST (MISCELLANEOUS) ×1 IMPLANT
COVER SURGICAL LIGHT HANDLE (MISCELLANEOUS) ×1 IMPLANT
CUP ACET PINNACLE SECTR 56MM (Hips) IMPLANT
DERMABOND ADVANCED .7 DNX12 (GAUZE/BANDAGES/DRESSINGS) ×1 IMPLANT
DRAPE FOOT SWITCH (DRAPES) ×1 IMPLANT
DRAPE STERI IOBAN 125X83 (DRAPES) ×1 IMPLANT
DRAPE U-SHAPE 47X51 STRL (DRAPES) ×2 IMPLANT
DRESSING AQUACEL AG SP 3.5X10 (GAUZE/BANDAGES/DRESSINGS) ×1 IMPLANT
DRSG AQUACEL AG SP 3.5X10 (GAUZE/BANDAGES/DRESSINGS) ×1
DURAPREP 26ML APPLICATOR (WOUND CARE) ×1 IMPLANT
ELECT REM PT RETURN 15FT ADLT (MISCELLANEOUS) ×1 IMPLANT
GLOVE BIO SURGEON STRL SZ 6 (GLOVE) ×1 IMPLANT
GLOVE BIOGEL PI IND STRL 6.5 (GLOVE) ×1 IMPLANT
GLOVE BIOGEL PI IND STRL 7.5 (GLOVE) ×1 IMPLANT
GLOVE ORTHO TXT STRL SZ7.5 (GLOVE) ×2 IMPLANT
GOWN STRL REUS W/ TWL LRG LVL3 (GOWN DISPOSABLE) ×2 IMPLANT
GOWN STRL REUS W/TWL LRG LVL3 (GOWN DISPOSABLE) ×2
HEAD ARTICULEZE (Hips) IMPLANT
HOLDER FOLEY CATH W/STRAP (MISCELLANEOUS) ×1 IMPLANT
KIT TURNOVER KIT A (KITS) IMPLANT
PACK ANTERIOR HIP CUSTOM (KITS) ×1 IMPLANT
PINNACLE ALTRX PLUS 4 N 36X56 (Hips) IMPLANT
PINNACLE SECTOR CUP 56MM (Hips) ×1 IMPLANT
SCREW 6.5MMX30MM (Screw) IMPLANT
STEM FEM ACTIS HIGH SZ7 (Stem) IMPLANT
SUT MNCRL AB 4-0 PS2 18 (SUTURE) ×1 IMPLANT
SUT STRATAFIX 0 PDS 27 VIOLET (SUTURE) ×1
SUT VIC AB 1 CT1 36 (SUTURE) ×3 IMPLANT
SUT VIC AB 2-0 CT1 27 (SUTURE) ×2
SUT VIC AB 2-0 CT1 TAPERPNT 27 (SUTURE) ×2 IMPLANT
SUTURE STRATFX 0 PDS 27 VIOLET (SUTURE) ×1 IMPLANT
TRAY FOLEY MTR SLVR 16FR STAT (SET/KITS/TRAYS/PACK) IMPLANT
TUBE SUCTION HIGH CAP CLEAR NV (SUCTIONS) ×1 IMPLANT
WATER STERILE IRR 1000ML POUR (IV SOLUTION) ×1 IMPLANT

## 2022-05-21 NOTE — Anesthesia Preprocedure Evaluation (Signed)
Anesthesia Evaluation  Patient identified by MRN, date of birth, ID band Patient confused and Patient unresponsive    Reviewed: Allergy & Precautions, NPO status , Patient's Chart, lab work & pertinent test results  Airway Mallampati: II  TM Distance: >3 FB Neck ROM: Full    Dental  (+) Teeth Intact, Dental Advisory Given   Pulmonary neg pulmonary ROS   Pulmonary exam normal breath sounds clear to auscultation       Cardiovascular negative cardio ROS Normal cardiovascular exam Rhythm:Regular Rate:Normal     Neuro/Psych  PSYCHIATRIC DISORDERS  Depression   Dementia PD    GI/Hepatic negative GI ROS, Neg liver ROS,,,  Endo/Other  negative endocrine ROS    Renal/GU negative Renal ROS     Musculoskeletal Closed displaced fracture of right femoral neck   Abdominal   Peds  Hematology  (+) REFUSES BLOOD PRODUCTS, JEHOVAH'S WITNESS  Anesthesia Other Findings Day of surgery medications reviewed with the patient.  Reproductive/Obstetrics                             Anesthesia Physical Anesthesia Plan  ASA: 4  Anesthesia Plan: General   Post-op Pain Management: Ofirmev IV (intra-op)*   Induction: Intravenous  PONV Risk Score and Plan: 2 and Dexamethasone and Ondansetron  Airway Management Planned: Oral ETT  Additional Equipment:   Intra-op Plan:   Post-operative Plan: Extubation in OR  Informed Consent: I have reviewed the patients History and Physical, chart, labs and discussed the procedure including the risks, benefits and alternatives for the proposed anesthesia with the patient or authorized representative who has indicated his/her understanding and acceptance.   Patient has DNR.  Discussed DNR with power of attorney and Continue DNR.   Dental advisory given and Consent reviewed with POA  Plan Discussed with: CRNA  Anesthesia Plan Comments:        Anesthesia Quick  Evaluation

## 2022-05-21 NOTE — Anesthesia Procedure Notes (Signed)
Procedure Name: Intubation Date/Time: 05/21/2022 5:04 PM  Performed by: Cynda Familia, CRNAPre-anesthesia Checklist: Patient identified, Emergency Drugs available, Suction available and Patient being monitored Patient Re-evaluated:Patient Re-evaluated prior to induction Oxygen Delivery Method: Circle System Utilized Preoxygenation: Pre-oxygenation with 100% oxygen Induction Type: IV induction Ventilation: Mask ventilation without difficulty Laryngoscope Size: 2 Grade View: Grade I Tube type: Oral Number of attempts: 1 Airway Equipment and Method: Stylet Placement Confirmation: ETT inserted through vocal cords under direct vision, positive ETCO2 and breath sounds checked- equal and bilateral Secured at: 22 cm Tube secured with: Tape Dental Injury: Teeth and Oropharynx as per pre-operative assessment  Comments: IV induction Turk-- intubation AM CRNA atraumatic-- teeth and mouth as preop - bilat BS Gifford Shave

## 2022-05-21 NOTE — Transfer of Care (Signed)
Immediate Anesthesia Transfer of Care Note  Patient: Lee Martinez  Procedure(s) Performed: TOTAL HIP ARTHROPLASTY ANTERIOR APPROACH (Right: Hip)  Patient Location: PACU  Anesthesia Type:General  Level of Consciousness: sedated  Airway & Oxygen Therapy: Patient Spontanous Breathing and Patient connected to face mask oxygen  Post-op Assessment: Report given to RN and Post -op Vital signs reviewed and stable  Post vital signs: Reviewed and stable  Last Vitals:  Vitals Value Taken Time  BP 141/85 05/21/22 1838  Temp    Pulse 80 05/21/22 1840  Resp 8 05/21/22 1840  SpO2 100 % 05/21/22 1840  Vitals shown include unvalidated device data.  Last Pain:  Vitals:   05/21/22 1632  TempSrc: Oral  PainSc: 4       Patients Stated Pain Goal: 3 (98/02/21 7981)  Complications: No notable events documented.

## 2022-05-21 NOTE — Progress Notes (Signed)
Initial Nutrition Assessment  DOCUMENTATION CODES:   Severe malnutrition in context of chronic illness  INTERVENTION:  - Advance diet to Regular as medically appropriate.  - Recommend Boost Plus po TID once diet advanced. Each supplement provides 250 kcal and 9 grams of protein - Encourage intake at all meals and of supplements.  - Multivitamin to support micronutrient needs for healing. - Monitor weight trends.    NUTRITION DIAGNOSIS:   Severe Malnutrition related to chronic illness (Parkinson's disease) as evidenced by severe fat depletion, severe muscle depletion  GOAL:   Patient will meet greater than or equal to 90% of their needs  MONITOR:   PO intake, Supplement acceptance, Weight trends, Diet advancement  REASON FOR ASSESSMENT:   Consult Hip fracture protocol  ASSESSMENT:   87 y.o. male with medical history significant of Parkinson's dz who tripped and fell down a couple of stairs with resulting fracture of R femoral neck and fracture of R acromial process.  Visited with patient this afternoon after he returned from the OR. He reports a UBW of 135# but feels he may have lost weight recently due to Parkinson's disease. Weights have fluctuated up and down from 150-137# over the past year. Patient reports he eats 3 meals a day at home in addition to 3 Boosts. Admits his appetite is not great at baseline and his current appetite is the same. Discussed importance of nutrition and trying to eat well during admission to support healing and avoid weight loss. He is agreeable to receive Boost during admission.   Medications reviewed and include: -  Labs reviewed:  -   NUTRITION - FOCUSED PHYSICAL EXAM:  Flowsheet Row Most Recent Value  Orbital Region Moderate depletion  Upper Arm Region Moderate depletion  Thoracic and Lumbar Region Severe depletion  Buccal Region Severe depletion  Temple Region Severe depletion  Clavicle Bone Region Severe depletion  Clavicle and  Acromion Bone Region Severe depletion  Scapular Bone Region Unable to assess  Dorsal Hand Moderate depletion  Patellar Region Severe depletion  Anterior Thigh Region Severe depletion  Posterior Calf Region Severe depletion  Edema (RD Assessment) None  Hair Reviewed  Eyes Reviewed  Mouth Reviewed  Skin Reviewed  Nails Reviewed       Diet Order:   Diet Order             Diet NPO time specified Except for: Sips with Meds  Diet effective now                   EDUCATION NEEDS:  Education needs have been addressed  Skin:  Skin Assessment: Reviewed RN Assessment  Last BM:  1/6  Height:  Ht Readings from Last 1 Encounters:  05/20/22 6' (1.829 m)   Weight:  Wt Readings from Last 1 Encounters:  05/20/22 62 kg   Ideal Body Weight:  80.91 kg  BMI:  Body mass index is 18.54 kg/m.  Estimated Nutritional Needs:  Kcal:  2050-2250 kcals Protein:  90-105 grams Fluid:  >/= 2L    Samson Frederic RD, LDN For contact information, refer to [facility].

## 2022-05-21 NOTE — Op Note (Signed)
NAME:  ALBERTA CAIRNS                ACCOUNT NO.: [id number]      MEDICAL RECORD NO.: [id number]      FACILITY:  [facility]      PHYSICIAN:  Mauri Pole  DATE OF BIRTH:  [birth year]     DATE OF PROCEDURE:  05/21/2022                                 OPERATIVE REPORT         PREOPERATIVE DIAGNOSIS: Right displaced hip femoral neck fracture     POSTOPERATIVE DIAGNOSIS:  Right displaced hip femoral neck fracture     PROCEDURE:  Right total hip replacement through an anterior approach   utilizing DePuy THR system, component size 56 mm pinnacle cup, a size 36+4 neutral   Altrex liner, a size 7 Hi  Actis stem with a 36+5 Articuleze metal headceramic   ball.      SURGEON:  Pietro Cassis. [name], M.D.      ASSISTANT:  Costella Hatcher, PA-C     ANESTHESIA:  General.      SPECIMENS:  None.      COMPLICATIONS:  None.      BLOOD LOSS:  375 cc     DRAINS:  None.      INDICATION OF THE PROCEDURE:  RAYMONT ANDREONI is a 87 y.o. male with multiple medical comorbidities including Parkinson's disease, early onset dementia.  It is otherwise noted that he is Jehovah witness.  He currently lives relatively independent with nearly 24-hour care.  He apparently had an unwitnessed fall last night.  He was unable to stand.  He was brought to the emergency room for radiographic evaluation.  Radiographs revealed a displaced femoral neck fracture.  Orthopedics was consulted for management.  I reviewed the indications for treatment including pain relief as well as trying to maintain some functional level of activity at least with transfers.  We discussed risks of infection DVT dislocation and the risk of mortality given his medical comorbidities.  Consent was obtained for the benefit of fracture management apparently.     PROCEDURE IN DETAIL:  The patient was brought to operative theater.   Once adequate anesthesia, preoperative antibiotics, 2 gm of Ancef, 1 gm of Tranexamic Acid, and 10 mg of  Decadron were administered, the patient was positioned supine on the [employer] table.  Once the patient was safely positioned with adequate padding of boney prominences we predraped out the hip, and used fluoroscopy to confirm orientation of the pelvis.      The right hip was then prepped and draped from proximal iliac crest to   mid thigh with a shower curtain technique.      Time-out was performed identifying the patient, planned procedure, and the appropriate extremity.     An incision was then made 2 cm lateral to the   anterior superior iliac spine extending over the orientation of the   tensor fascia lata muscle and sharp dissection was carried down to the   fascia of the muscle.      The fascia was then incised.  The muscle belly was identified and swept   laterally and retractor placed along the superior neck.  Following   cauterization of the circumflex vessels and removing some pericapsular   fat, a second cobra retractor was placed  on the inferior neck.  A T-capsulotomy was made along the line of the   superior neck to the trochanteric fossa, then extended proximally and   distally.  Tag sutures were placed and the retractors were then placed   intracapsular.  We then identified the trochanteric fossa and   orientation of my neck cut and then made a neck osteotomy with the femur on traction.  The fractured femoral neck segment and the femoral head were then removed without difficulty or complication.  Traction was let   off and retractors were placed posterior and anterior around the   acetabulum.      The labrum and foveal tissue were debrided.  I began reaming with a 52 mm   reamer and reamed up to 55 mm reamer with good bony bed preparation and a 56 mm  cup was chosen.  The final 56 mm Pinnacle cup was then impacted under fluoroscopy to confirm the depth of penetration and orientation with respect to   Abduction and forward flexion.  A screw was placed into the ilium followed  by the hole eliminator.  The final   36+4 neutral Altrex liner was impacted with good visualized rim fit.  The cup was positioned anatomically within the acetabular portion of the pelvis.      At this point, the femur was rolled to 100 degrees.  Further capsule was   released off the inferior aspect of the femoral neck.  I then   released the superior capsule proximally.  With the leg in a neutral position the hook was placed laterally   along the femur under the vastus lateralis origin and elevated manually and then held in position using the hook attachment on the bed.  The leg was then extended and adducted with the leg rolled to 100   degrees of external rotation.  Retractors were placed along the medial calcar and posteriorly over the greater trochanter.  Once the proximal femur was fully   exposed, I used a box osteotome to set orientation.  I then began   broaching with the starting chili pepper broach and passed this by hand and then broached up to 7 A.  With the 7 broach in place I chose a high offset neck and did several trial reductions.  The offset was appropriate, leg lengths   appeared to be equal best matched with the +5 head ball trial confirmed radiographically.  I intentionally tried to position the acetabular shell and a little bit more forward flexion as well as placed a +5 head ball to try to lengthen the hip for stability purposes given his medical history of Parkinson's disease, dementia with associated noncompliance with activity.  Given these findings, I went ahead and dislocated the hip, repositioned all   retractors and positioned the right hip in the extended and abducted position.  The final 7 Hi Actis stem was   chosen and it was impacted down to the level of neck cut.  Based on this   and the trial reductions, a final 36+5 Articuleze metal head ball was chosen and   impacted onto a clean and dry trunnion, and the hip was reduced.  The   hip had been irrigated  throughout the case again at this point.  I did   reapproximate the superior capsular leaflet to the anterior leaflet   using #1 Vicryl.  The fascia of the   tensor fascia lata muscle was then reapproximated using #1 Vicryl and #0 Stratafix  sutures.  The   remaining wound was closed with 2-0 Vicryl and running 4-0 Monocryl.   The hip was cleaned, dried, and dressed sterilely using Dermabond and   Aquacel dressing.  The patient was then brought   to recovery room in stable condition tolerating the procedure well.    Costella Hatcher, PA-C was present for the entirety of the case involved from   preoperative positioning, perioperative retractor management, general   facilitation of the case, as well as primary wound closure as assistant.            Pietro Cassis [name], M.D.        05/21/2022 4:59 PM

## 2022-05-21 NOTE — Progress Notes (Signed)
PROGRESS NOTE   Lee Martinez  CZY:[id number] DOB: October 13, 1935 DOA: 05/20/2022 PCP: [name], FNP  Brief Narrative:  87 year old white male community dwelling-[employer] Known Parkinson's disease with history of falls-was in the process of considering placement--he had a fall with closed displaced acromial fracture 02/16/2022 and follow-up with orthopedics 04/01/2022 HLD BPH AAA 3.1 cm  Patient had an accidental fall down a couple of stairs where he fell and hit his right hip  Hospital-Problem based course  Accidental fall -For right total hip repair to help manage -DVT prophylaxis pain control and outpatient follow-up as per orthopedics - Pain currently seems controlled on Norco 1-2 every 6 as needed as well as morphine 0.5 every 2 as needed - On saline 100 cc/H, continue fluids but at a lower rate of 50 cc/H  Parkinson's disease with behavioral disturbance - Continue Sinemet 2 tabs 3 times daily - Continue sertraline 50  HLD - Hold atorvastatin for now  AAA 3.1 cm - Outpatient follow-up  BPH - Foley placed-will need clamping trial postprocedure  DVT prophylaxis: SCD Code Status: Full Family Communication: Discussed with grandsons at the bedside Disposition:  Status is: Inpatient Remains inpatient appropriate because:   Needs hip surgery   Consultants:  Orthopedics Awake coherent no distress  Subjective: Eyes are closed using he has 5/10 pain he has some pain in his right hip He has a baseline tremor  Objective: Vitals:   05/20/22 2115 05/20/22 2204 05/20/22 2230 05/21/22 0628  BP: 102/71  128/80 102/69  Pulse:   100 95  Resp: (!) '22  15 16  '$ Temp:  98.3 F (36.8 C) 98.3 F (36.8 C) 97.7 F (36.5 C)  TempSrc:  Oral Oral Oral  SpO2:   95% 91%  Weight:   62 kg   Height:   6' (1.829 m)    No intake or output data in the 24 hours ending 05/21/22 0757 Filed Weights   05/20/22 2230  Weight: 62 kg    Examination:  EOMI NCAT no focal deficit  chest is clear S1-S2 no murmur seems to be in sinus Abdomen is soft no rebound Foley catheter in place Did not attempt to mobilize right side given fracture  Data Reviewed: personally reviewed   CBC    Component Value Date/Time   WBC 9.9 05/21/2022 0349   RBC 4.37 05/21/2022 0349   HGB 13.8 05/21/2022 0349   HGB 14.6 10/26/2020 1440   HCT 42.2 05/21/2022 0349   HCT 42.8 10/26/2020 1440   PLT 188 05/21/2022 0349   PLT 250 10/26/2020 1440   MCV 96.6 05/21/2022 0349   MCV 92 10/26/2020 1440   MCH 31.6 05/21/2022 0349   MCHC 32.7 05/21/2022 0349   RDW 13.9 05/21/2022 0349   RDW 13.0 10/26/2020 1440   LYMPHSABS 0.8 05/20/2022 1940   MONOABS 1.2 (H) 05/20/2022 1940   EOSABS 0.0 05/20/2022 1940   BASOSABS 0.1 05/20/2022 1940      Latest Ref Rng & Units 05/21/2022    3:49 AM 05/20/2022    7:40 PM 02/16/2022   12:38 PM  CMP  Glucose 70 - 99 mg/dL 134  133  96   BUN 8 - 23 mg/dL '29  28  20   '$ Creatinine 0.61 - 1.24 mg/dL 1.01  1.03  0.99   Sodium 135 - 145 mmol/L 138  137  142   Potassium 3.5 - 5.1 mmol/L 4.1  3.9  4.4   Chloride 98 - 111 mmol/L 106  105  108   CO2 22 - 32 mmol/L '24  23  29   '$ Calcium 8.9 - 10.3 mg/dL 9.1  9.3  9.2   Total Protein 6.5 - 8.1 g/dL   6.1   Total Bilirubin 0.3 - 1.2 mg/dL   0.5   Alkaline Phos 38 - 126 U/L   55   AST 15 - 41 U/L   16   ALT 0 - 44 U/L   <5      Radiology Studies: DG Lumbar Spine Complete  Result Date: 05/20/2022 CLINICAL DATA:  Fall, right hip fracture. EXAM: LUMBAR SPINE - COMPLETE 4+ VIEW COMPARISON:  12/12/2021 FINDINGS: The right femoral neck fracture is visible on the oblique projections. Cross-table lateral views of the lumbar spine demonstrate no subluxation or definite fracture, posterior elements are obscured by presumed backboard or bedding material. IMPRESSION: 1. Right femoral neck fracture. 2. No lumbar spine fracture or subluxation identified. Electronically Signed   By: Van Clines M.D.   On: 05/20/2022 18:36    DG Chest 1 View  Result Date: 05/20/2022 CLINICAL DATA:  Fall, right hip pain EXAM: CHEST  1 VIEW COMPARISON:  02/16/2022 FINDINGS: Nonunited fracture of the right acromion. Heterogeneous density and lucency along the left medial shoulder near the junction with the neck, probably from clothing or artifact, less likely to be from soft tissue plane disruption related to local trauma. I do not see a definite adjacent scapular, left clavicular, or left rib fracture to further explain this soft tissue appearance. Correlate with any tenderness along the left lower neck at the junction with the shoulder. Mildly elevated right hemidiaphragm.  The lungs appear clear. IMPRESSION: 1. Nonunited fracture of the right acromion. 2. Heterogeneous density and lucency along the left medial shoulder near the junction with the neck, probably from clothing or artifact, less likely to be from soft tissue plane disruption related to local trauma. Correlate with any tenderness along the left lower neck at the junction with the shoulder. 3. Stable elevation of the right hemidiaphragm. Electronically Signed   By: Van Clines M.D.   On: 05/20/2022 18:34   DG Hip Unilat W or Wo Pelvis 2-3 Views Right  Result Date: 05/20/2022 CLINICAL DATA:  Pain after fall EXAM: DG HIP (WITH OR WITHOUT PELVIS) 3V RIGHT COMPARISON:  X-ray 12/12/2021 FINDINGS: Displaced and angulated subcapital femoral neck fracture on the right. Osteopenia. No additional fracture or dislocation. Hyperostosis. Surgical mesh along the pelvis. Scattered colonic stool. There is a sclerotic lesion along the proximal femur along the greater trochanter. Possibilities include a bone island. This is unchanged from prior x-ray. Subtle area of sclerosis along the posterosuperior left iliac bone is also stable from prior exams. Please correlate with prior workup. IMPRESSION: Displaced and angulated subcapital femoral neck fracture. Sclerotic lesions along the right proximal  femur and the left iliac bone are stable from prior. Please correlate with prior workup. If needed a bone scan can be performed as clinically indicated. Electronically Signed   By: Jill Side M.D.   On: 05/20/2022 18:33     Scheduled Meds:  carbidopa-levodopa  2 tablet Oral TID   Continuous Infusions:   LOS: 1 day   Time spent: [place], MD [facility] To contact the attending provider between 7A-7P or the covering provider during after hours 7P-7A, please log into the web site [email or web address] and access using universal [place] password for that web site. If you do not have the password, please call the hospital  operator.  05/21/2022, 7:57 AM

## 2022-05-21 NOTE — H&P (View-Only) (Signed)
Reason for Consult: right hip fracture Referring Physician: Verlon Au, MD  Lee Martinez is an 87 y.o. male.  HPI: Lee Martinez is a 87 y.o. male with medical history significant of Parkinson's dz.   Pt tripped and fell down a couple of stairs, injury to R hip, didn't hit head, no neck nor back pain, not on AC.  He lives alone with nearly 24 hour care Some early dementia reported Walks some with a walker and hopeful supervision  Past Medical History:  Diagnosis Date   Parkinson's disease 07/2011   followed by [facility] Neurology    Past Surgical History:  Procedure Laterality Date   basil removal     HERNIA REPAIR     THYROID SURGERY      Family History  Problem Relation Age of Onset   Heart failure Mother    Breast cancer Sister     Social History:  reports that he has never smoked. He has never used smokeless tobacco. He reports that he does not currently use alcohol after a past usage of about 1.0 standard drink of alcohol per week. He reports that he does not use drugs.  Allergies: No Known Allergies  Medications: I have reviewed the patient's current medications. Scheduled:  carbidopa-levodopa  2 tablet Oral TID    Results for orders placed or performed during the hospital encounter of 05/20/22 (from the past 24 hour(s))  Basic metabolic panel     Status: Abnormal   Collection Time: 05/20/22  7:40 PM  Result Value Ref Range   Sodium 137 135 - 145 mmol/L   Potassium 3.9 3.5 - 5.1 mmol/L   Chloride 105 98 - 111 mmol/L   CO2 23 22 - 32 mmol/L   Glucose, Bld 133 (H) 70 - 99 mg/dL   BUN 28 (H) 8 - 23 mg/dL   Creatinine, Ser 1.03 0.61 - 1.24 mg/dL   Calcium 9.3 8.9 - 10.3 mg/dL   GFR, Estimated >60 >60 mL/min   Anion gap 9 5 - 15  CBC with Differential     Status: Abnormal   Collection Time: 05/20/22  7:40 PM  Result Value Ref Range   WBC 13.0 (H) 4.0 - 10.5 K/uL   RBC 4.38 4.22 - 5.81 MIL/uL   Hemoglobin 13.5 13.0 - 17.0 g/dL   HCT 40.7 39.0 - 52.0 %   MCV 92.9  80.0 - 100.0 fL   MCH 30.8 26.0 - 34.0 pg   MCHC 33.2 30.0 - 36.0 g/dL   RDW 13.5 11.5 - 15.5 %   Platelets 199 150 - 400 K/uL   nRBC 0.0 0.0 - 0.2 %   Neutrophils Relative % 84 %   Neutro Abs 10.9 (H) 1.7 - 7.7 K/uL   Lymphocytes Relative 6 %   Lymphs Abs 0.8 0.7 - 4.0 K/uL   Monocytes Relative 9 %   Monocytes Absolute 1.2 (H) 0.1 - 1.0 K/uL   Eosinophils Relative 0 %   Eosinophils Absolute 0.0 0.0 - 0.5 K/uL   Basophils Relative 1 %   Basophils Absolute 0.1 0.0 - 0.1 K/uL   Immature Granulocytes 0 %   Abs Immature Granulocytes 0.04 0.00 - 0.07 K/uL  Protime-INR     Status: None   Collection Time: 05/20/22  7:40 PM  Result Value Ref Range   Prothrombin Time 14.4 11.4 - 15.2 seconds   INR 1.1 0.8 - 1.2  Type and screen [place]     Status: None  Collection Time: 05/20/22  7:40 PM  Result Value Ref Range   ABO/RH(D) O POS    Antibody Screen NEG    Sample Expiration      05/23/2022,2359 Performed at [facility], [place] [address] [place], [place] [address]   ABO/Rh     Status: None   Collection Time: 05/20/22 10:57 PM  Result Value Ref Range   ABO/RH(D)      O POS Performed at [facility], [place] [address] [place], [place] [address]   CBC     Status: None   Collection Time: 05/21/22  3:49 AM  Result Value Ref Range   WBC 9.9 4.0 - 10.5 K/uL   RBC 4.37 4.22 - 5.81 MIL/uL   Hemoglobin 13.8 13.0 - 17.0 g/dL   HCT 42.2 39.0 - 52.0 %   MCV 96.6 80.0 - 100.0 fL   MCH 31.6 26.0 - 34.0 pg   MCHC 32.7 30.0 - 36.0 g/dL   RDW 13.9 11.5 - 15.5 %   Platelets 188 150 - 400 K/uL   nRBC 0.0 0.0 - 0.2 %  Basic metabolic panel     Status: Abnormal   Collection Time: 05/21/22  3:49 AM  Result Value Ref Range   Sodium 138 135 - 145 mmol/L   Potassium 4.1 3.5 - 5.1 mmol/L   Chloride 106 98 - 111 mmol/L   CO2 24 22 - 32 mmol/L   Glucose, Bld 134 (H) 70 - 99 mg/dL   BUN 29 (H) 8 - 23 mg/dL   Creatinine, Ser 1.01 0.61 -  1.24 mg/dL   Calcium 9.1 8.9 - 10.3 mg/dL   GFR, Estimated >60 >60 mL/min   Anion gap 8 5 - 15     X-ray: CLINICAL DATA:  Pain after fall   EXAM: DG HIP (WITH OR WITHOUT PELVIS) 3V RIGHT   COMPARISON:  X-ray 12/12/2021   FINDINGS: Displaced and angulated subcapital femoral neck fracture on the right. Osteopenia. No additional fracture or dislocation. Hyperostosis. Surgical mesh along the pelvis. Scattered colonic stool. There is a sclerotic lesion along the proximal femur along the greater trochanter. Possibilities include a bone island. This is unchanged from prior x-ray. Subtle area of sclerosis along the posterosuperior left iliac bone is also stable from prior exams. Please correlate with prior workup.   IMPRESSION: Displaced and angulated subcapital femoral neck fracture.   Sclerotic lesions along the right proximal femur and the left iliac bone are stable from prior. Please correlate with prior workup. If needed a bone scan can be performed as clinically indicated.     Electronically Signed   By: Jill Side M.D.  ROS: As per HPI Dementia  Blood pressure 102/69, pulse 95, temperature 97.7 F (36.5 C), temperature source Oral, resp. rate 16, height 6' (1.829 m), weight 62 kg, SpO2 91 %.  Physical Exam: Constitutional: NAD, calm, comfortable, appears to be sleeping this am Eyes: PERRL, lids and conjunctivae normal ENMT: Mucous membranes are moist. Posterior pharynx clear of any exudate or lesions.Normal dentition.  Neck: normal, supple, no masses, no thyromegaly Respiratory: clear to auscultation bilaterally, no wheezing, no crackles. Normal respiratory effort. No accessory muscle use.  Cardiovascular: Regular rate and rhythm, no murmurs / rubs / gallops. No extremity edema. 2+ pedal pulses. No carotid bruits.  Abdomen: no tenderness, no masses palpated. No hepatosplenomegaly. Bowel sounds positive.  Musculoskeletal: Bilateral hip flexion contractures, pain  with any movement of right hip Skin: no rashes, lesions, ulcers. No  induration Neurologic: CN 2-12 grossly intact. Sensation intact, DTR normal. Strength 5/5 in all 4.  Psychiatric: Normal judgment and insight. Alert and oriented x 3. Normal mood.   Assessment/Plan: Right hip femoral neck fracture  Plan: NPO Will plan to take him to the OR today for a right THR to manage his hip fracture Orders placed I will try and reach out to his son later this am to review  Mauri Pole 05/21/2022, 7:43 AM

## 2022-05-21 NOTE — Patient Outreach (Signed)
  Care Coordination   Collaboration  Visit Note   05/21/2022 Name: Lee Martinez MRN: [id number] DOB: December 23, 1935  Lee Martinez is a 86 y.o. year old male who sees [name], FNP for primary care. I  attempted to follow up on the status of patients APS referral.   Goals Addressed             This Visit's Progress    Care Coordination Activities       Care Coordination Interventions: Contacted Chauncy Lean [phone] to follow up on status of recent APS referral - voice message left requesting a return call Performed chart review to note patient currently inpatient due to hip fracture SW will follow up with APS over the next 24 hours if no return call is received to determine status of referral        SDOH assessments and interventions completed:  No     Care Coordination Interventions:  No, not indicated   Follow up plan:  SW will continue to follow.    Encounter Outcome:  No Answer   [name], BSW, CDP Social Worker, Certified Dementia Practitioner [place] [facility]  Care Coordination [phone]

## 2022-05-21 NOTE — Interval H&P Note (Signed)
History and Physical Interval Note:  05/21/2022 2:36 PM  Lee Martinez  has presented today for surgery, with the diagnosis of RIGHT FEMORAL NECK FX.  The various methods of treatment have been discussed with the patient and family. After consideration of risks, benefits and other options for treatment, the patient has consented to  Procedure(s): TOTAL HIP ARTHROPLASTY ANTERIOR APPROACH (Right) as a surgical intervention.  The patient's history has been reviewed, patient examined, no change in status, stable for surgery.  I have reviewed the patient's chart and labs.  Questions were answered to the patient's satisfaction.     Mauri Pole

## 2022-05-21 NOTE — Discharge Instructions (Addendum)
Patient may get out of bed with assistance using an assistive device Patient is to participate in physical therapy regimen per their assessment Patient is DNR Patient intermittently should have assistance with meals Patient has a Foley catheter in place, provide Foley catheter care per facility protocol Per urology recommendations, remove Foley and attempt voiding trial on 1/19.  If this is unsuccessful Foley may be reinserted Patient to follow-up with Dr. [name] with [facility] in 2 weeks, please call to ensure that the patient has an appointment Patient to follow-up with Dr. [name] with orthopedic surgery in 2 weeks, please call to ensure patient has an appointment

## 2022-05-21 NOTE — Anesthesia Preprocedure Evaluation (Signed)
Anesthesia Evaluation  Patient identified by MRN, date of birth, ID band Patient confused    Reviewed: Allergy & Precautions, NPO status , Patient's Chart, lab work & pertinent test results  Airway Mallampati: II  TM Distance: >3 FB Neck ROM: Full    Dental  (+) Teeth Intact, Dental Advisory Given   Pulmonary neg pulmonary ROS   Pulmonary exam normal breath sounds clear to auscultation       Cardiovascular negative cardio ROS Normal cardiovascular exam Rhythm:Regular Rate:Normal     Neuro/Psych  PSYCHIATRIC DISORDERS  Depression   Dementia PD    GI/Hepatic negative GI ROS, Neg liver ROS,,,  Endo/Other  negative endocrine ROS    Renal/GU negative Renal ROS     Musculoskeletal negative musculoskeletal ROS (+)    Abdominal   Peds  Hematology  (+) REFUSES BLOOD PRODUCTS, JEHOVAH'S WITNESS  Anesthesia Other Findings Day of surgery medications reviewed with the patient.  Reproductive/Obstetrics                             Anesthesia Physical Anesthesia Plan  ASA: 3  Anesthesia Plan:    Post-op Pain Management: Regional block*   Induction:   PONV Risk Score and Plan: 1 and Treatment may vary due to age or medical condition  Airway Management Planned: Natural Airway  Additional Equipment:   Intra-op Plan:   Post-operative Plan:   Informed Consent: I have reviewed the patients History and Physical, chart, labs and discussed the procedure including the risks, benefits and alternatives for the proposed anesthesia with the patient or authorized representative who has indicated his/her understanding and acceptance.     Dental advisory given and Consent reviewed with POA  Plan Discussed with:   Anesthesia Plan Comments:        Anesthesia Quick Evaluation

## 2022-05-21 NOTE — Consult Note (Signed)
Reason for Consult: right hip fracture Referring Physician: Verlon Au, MD  Lee Martinez is an 87 y.o. male.  HPI: Lee Martinez is a 87 y.o. male with medical history significant of Parkinson's dz.   Pt tripped and fell down a couple of stairs, injury to R hip, didn't hit head, no neck nor back pain, not on AC.  He lives alone with nearly 24 hour care Some early dementia reported Walks some with a walker and hopeful supervision  Past Medical History:  Diagnosis Date   Parkinson's disease 07/2011   followed by [facility] Neurology    Past Surgical History:  Procedure Laterality Date   basil removal     HERNIA REPAIR     THYROID SURGERY      Family History  Problem Relation Age of Onset   Heart failure Mother    Breast cancer Sister     Social History:  reports that he has never smoked. He has never used smokeless tobacco. He reports that he does not currently use alcohol after a past usage of about 1.0 standard drink of alcohol per week. He reports that he does not use drugs.  Allergies: No Known Allergies  Medications: I have reviewed the patient's current medications. Scheduled:  carbidopa-levodopa  2 tablet Oral TID    Results for orders placed or performed during the hospital encounter of 05/20/22 (from the past 24 hour(s))  Basic metabolic panel     Status: Abnormal   Collection Time: 05/20/22  7:40 PM  Result Value Ref Range   Sodium 137 135 - 145 mmol/L   Potassium 3.9 3.5 - 5.1 mmol/L   Chloride 105 98 - 111 mmol/L   CO2 23 22 - 32 mmol/L   Glucose, Bld 133 (H) 70 - 99 mg/dL   BUN 28 (H) 8 - 23 mg/dL   Creatinine, Ser 1.03 0.61 - 1.24 mg/dL   Calcium 9.3 8.9 - 10.3 mg/dL   GFR, Estimated >60 >60 mL/min   Anion gap 9 5 - 15  CBC with Differential     Status: Abnormal   Collection Time: 05/20/22  7:40 PM  Result Value Ref Range   WBC 13.0 (H) 4.0 - 10.5 K/uL   RBC 4.38 4.22 - 5.81 MIL/uL   Hemoglobin 13.5 13.0 - 17.0 g/dL   HCT 40.7 39.0 - 52.0 %   MCV 92.9  80.0 - 100.0 fL   MCH 30.8 26.0 - 34.0 pg   MCHC 33.2 30.0 - 36.0 g/dL   RDW 13.5 11.5 - 15.5 %   Platelets 199 150 - 400 K/uL   nRBC 0.0 0.0 - 0.2 %   Neutrophils Relative % 84 %   Neutro Abs 10.9 (H) 1.7 - 7.7 K/uL   Lymphocytes Relative 6 %   Lymphs Abs 0.8 0.7 - 4.0 K/uL   Monocytes Relative 9 %   Monocytes Absolute 1.2 (H) 0.1 - 1.0 K/uL   Eosinophils Relative 0 %   Eosinophils Absolute 0.0 0.0 - 0.5 K/uL   Basophils Relative 1 %   Basophils Absolute 0.1 0.0 - 0.1 K/uL   Immature Granulocytes 0 %   Abs Immature Granulocytes 0.04 0.00 - 0.07 K/uL  Protime-INR     Status: None   Collection Time: 05/20/22  7:40 PM  Result Value Ref Range   Prothrombin Time 14.4 11.4 - 15.2 seconds   INR 1.1 0.8 - 1.2  Type and screen [place]     Status: None  Collection Time: 05/20/22  7:40 PM  Result Value Ref Range   ABO/RH(D) O POS    Antibody Screen NEG    Sample Expiration      05/23/2022,2359 Performed at [facility], [place] [address], [place], [place] [address]   ABO/Rh     Status: None   Collection Time: 05/20/22 10:57 PM  Result Value Ref Range   ABO/RH(D)      O POS Performed at [facility], [place] [address], [place], [place] [address]   CBC     Status: None   Collection Time: 05/21/22  3:49 AM  Result Value Ref Range   WBC 9.9 4.0 - 10.5 K/uL   RBC 4.37 4.22 - 5.81 MIL/uL   Hemoglobin 13.8 13.0 - 17.0 g/dL   HCT 42.2 39.0 - 52.0 %   MCV 96.6 80.0 - 100.0 fL   MCH 31.6 26.0 - 34.0 pg   MCHC 32.7 30.0 - 36.0 g/dL   RDW 13.9 11.5 - 15.5 %   Platelets 188 150 - 400 K/uL   nRBC 0.0 0.0 - 0.2 %  Basic metabolic panel     Status: Abnormal   Collection Time: 05/21/22  3:49 AM  Result Value Ref Range   Sodium 138 135 - 145 mmol/L   Potassium 4.1 3.5 - 5.1 mmol/L   Chloride 106 98 - 111 mmol/L   CO2 24 22 - 32 mmol/L   Glucose, Bld 134 (H) 70 - 99 mg/dL   BUN 29 (H) 8 - 23 mg/dL   Creatinine, Ser 1.01 0.61 -  1.24 mg/dL   Calcium 9.1 8.9 - 10.3 mg/dL   GFR, Estimated >60 >60 mL/min   Anion gap 8 5 - 15     X-ray: CLINICAL DATA:  Pain after fall   EXAM: DG HIP (WITH OR WITHOUT PELVIS) 3V RIGHT   COMPARISON:  X-ray 12/12/2021   FINDINGS: Displaced and angulated subcapital femoral neck fracture on the right. Osteopenia. No additional fracture or dislocation. Hyperostosis. Surgical mesh along the pelvis. Scattered colonic stool. There is a sclerotic lesion along the proximal femur along the greater trochanter. Possibilities include a bone island. This is unchanged from prior x-ray. Subtle area of sclerosis along the posterosuperior left iliac bone is also stable from prior exams. Please correlate with prior workup.   IMPRESSION: Displaced and angulated subcapital femoral neck fracture.   Sclerotic lesions along the right proximal femur and the left iliac bone are stable from prior. Please correlate with prior workup. If needed a bone scan can be performed as clinically indicated.     Electronically Signed   By: Jill Side M.D.  ROS: As per HPI Dementia  Blood pressure 102/69, pulse 95, temperature 97.7 F (36.5 C), temperature source Oral, resp. rate 16, height 6' (1.829 m), weight 62 kg, SpO2 91 %.  Physical Exam: Constitutional: NAD, calm, comfortable, appears to be sleeping this am Eyes: PERRL, lids and conjunctivae normal ENMT: Mucous membranes are moist. Posterior pharynx clear of any exudate or lesions.Normal dentition.  Neck: normal, supple, no masses, no thyromegaly Respiratory: clear to auscultation bilaterally, no wheezing, no crackles. Normal respiratory effort. No accessory muscle use.  Cardiovascular: Regular rate and rhythm, no murmurs / rubs / gallops. No extremity edema. 2+ pedal pulses. No carotid bruits.  Abdomen: no tenderness, no masses palpated. No hepatosplenomegaly. Bowel sounds positive.  Musculoskeletal: Bilateral hip flexion contractures, pain  with any movement of right hip Skin: no rashes, lesions, ulcers. No  induration Neurologic: CN 2-12 grossly intact. Sensation intact, DTR normal. Strength 5/5 in all 4.  Psychiatric: Normal judgment and insight. Alert and oriented x 3. Normal mood.   Assessment/Plan: Right hip femoral neck fracture  Plan: NPO Will plan to take him to the OR today for a right THR to manage his hip fracture Orders placed I will try and reach out to his son later this am to review  Mauri Pole 05/21/2022, 7:43 AM

## 2022-05-21 NOTE — Anesthesia Postprocedure Evaluation (Signed)
Anesthesia Post Note  Patient: Lee Martinez  Procedure(s) Performed: TOTAL HIP ARTHROPLASTY ANTERIOR APPROACH (Right: Hip)     Patient location during evaluation: PACU Anesthesia Type: General Level of consciousness: confused Pain management: pain level controlled Vital Signs Assessment: post-procedure vital signs reviewed and stable Respiratory status: spontaneous breathing, nonlabored ventilation, respiratory function stable and patient connected to nasal cannula oxygen Cardiovascular status: blood pressure returned to baseline and stable Postop Assessment: no apparent nausea or vomiting Anesthetic complications: no   No notable events documented.  Last Vitals:  Vitals:   05/21/22 1900 05/21/22 1915  BP: 134/84 103/67  Pulse: 94 87  Resp: 13 12  Temp:  (!) 36.4 C  SpO2: 92% 92%    Last Pain:  Vitals:   05/21/22 1632  TempSrc: Oral  PainSc: 4         RLE Motor Response: Purposeful movement (05/21/22 1915)        Santa Lighter

## 2022-05-21 NOTE — Anesthesia Procedure Notes (Signed)
Anesthesia Regional Block: Femoral nerve block   Pre-Anesthetic Checklist: , timeout performed,  Correct Patient, Correct Site, Correct Laterality,  Correct Procedure, Correct Position, site marked,  Risks and benefits discussed,  Surgical consent,  Pre-op evaluation,  At surgeon's request and post-op pain management  Laterality: Right  Prep: chloraprep       Needles:  Injection technique: Single-shot  Needle Type: Echogenic Needle     Needle Length: 9cm  Needle Gauge: 21     Additional Needles:   Procedures:,,,, ultrasound used (permanent image in chart),,    Narrative:  Start time: 05/21/2022 9:13 AM End time: 05/21/2022 9:20 AM Injection made incrementally with aspirations every 5 mL.  Performed by: Personally  Anesthesiologist: Santa Lighter, MD  Additional Notes: No pain on injection. No increased resistance to injection. Injection made in 5cc increments.  Good needle visualization.  Patient tolerated procedure well.

## 2022-05-21 NOTE — Plan of Care (Signed)
Plan of care initiated and discussed. 

## 2022-05-22 ENCOUNTER — Encounter (HOSPITAL_COMMUNITY): Payer: Self-pay | Admitting: Orthopedic Surgery

## 2022-05-22 ENCOUNTER — Ambulatory Visit: Payer: Self-pay

## 2022-05-22 DIAGNOSIS — S72001A Fracture of unspecified part of neck of right femur, initial encounter for closed fracture: Secondary | ICD-10-CM | POA: Diagnosis not present

## 2022-05-22 DIAGNOSIS — F329 Major depressive disorder, single episode, unspecified: Secondary | ICD-10-CM | POA: Diagnosis not present

## 2022-05-22 DIAGNOSIS — E782 Mixed hyperlipidemia: Secondary | ICD-10-CM | POA: Diagnosis not present

## 2022-05-22 DIAGNOSIS — G20A1 Parkinson's disease without dyskinesia, without mention of fluctuations: Secondary | ICD-10-CM | POA: Diagnosis not present

## 2022-05-22 LAB — BASIC METABOLIC PANEL
Anion gap: 8 (ref 5–15)
BUN: 28 mg/dL — ABNORMAL HIGH (ref 8–23)
CO2: 22 mmol/L (ref 22–32)
Calcium: 8.3 mg/dL — ABNORMAL LOW (ref 8.9–10.3)
Chloride: 109 mmol/L (ref 98–111)
Creatinine, Ser: 1.01 mg/dL (ref 0.61–1.24)
GFR, Estimated: >60 mL/min (ref >60)
Glucose, Bld: 143 mg/dL — ABNORMAL HIGH (ref 70–99)
Potassium: 4.7 mmol/L (ref 3.5–5.1)
Sodium: 139 mmol/L (ref 135–145)

## 2022-05-22 LAB — CBC
HCT: 32.3 % — ABNORMAL LOW (ref 39.0–52.0)
Hemoglobin: 10.4 g/dL — ABNORMAL LOW (ref 13.0–17.0)
MCH: 31.4 pg (ref 26.0–34.0)
MCHC: 32.2 g/dL (ref 30.0–36.0)
MCV: 97.6 fL (ref 80.0–100.0)
Platelets: 149 10*3/uL — ABNORMAL LOW (ref 150–400)
RBC: 3.31 MIL/uL — ABNORMAL LOW (ref 4.22–5.81)
RDW: 14.1 % (ref 11.5–15.5)
WBC: 13.4 10*3/uL — ABNORMAL HIGH (ref 4.0–10.5)
nRBC: 0 % (ref 0.0–0.2)

## 2022-05-22 MED ORDER — ASPIRIN 81 MG PO CHEW: 81.0000 mg | CHEWABLE_TABLET | Freq: Two times a day (BID) | ORAL | 0 refills | Status: DC

## 2022-05-22 MED ORDER — HYDROCODONE-ACETAMINOPHEN 5-325 MG PO TABS: 1.0000 | ORAL_TABLET | ORAL | 0 refills | Status: DC | PRN

## 2022-05-22 MED ORDER — HYDROCODONE-ACETAMINOPHEN 5-325 MG PO TABS
1.0000 | ORAL_TABLET | ORAL | Status: DC | PRN
  Administered 2022-05-23 – 2022-05-27 (×6): 1 via ORAL
  Filled 2022-05-22 (×8): qty 1

## 2022-05-22 MED ORDER — CARBIDOPA-LEVODOPA 25-100 MG PO TABS
3.0000 | ORAL_TABLET | Freq: Three times a day (TID) | ORAL | Status: DC
  Administered 2022-05-22 – 2022-05-28 (×17): 3 via ORAL
  Filled 2022-05-22 (×19): qty 3

## 2022-05-22 MED ORDER — METHOCARBAMOL 500 MG PO TABS: 500.0000 mg | ORAL_TABLET | Freq: Four times a day (QID) | ORAL | 1 refills | Status: DC | PRN

## 2022-05-22 MED ORDER — ATORVASTATIN CALCIUM 10 MG PO TABS
10.0000 mg | ORAL_TABLET | ORAL | Status: DC
  Administered 2022-05-23 – 2022-05-27 (×3): 10 mg via ORAL
  Filled 2022-05-22 (×3): qty 1

## 2022-05-22 MED ORDER — BOOST PLUS PO LIQD
237.0000 mL | Freq: Three times a day (TID) | ORAL | Status: DC
  Administered 2022-05-22 – 2022-05-28 (×13): 237 mL via ORAL
  Filled 2022-05-22 (×20): qty 237

## 2022-05-22 NOTE — Assessment & Plan Note (Signed)
Resuming home regimen of statin therapy

## 2022-05-22 NOTE — Evaluation (Signed)
Physical Therapy Evaluation Patient Details Name: Lee Martinez MRN: [id number] DOB: 1935-07-10 Today's Date: 05/22/2022  History of Present Illness  87 y.o. male adm after fall resulting in femoral neck fx. pt is s/p R AA THA on 05/21/21. PMH: Parkinson's disease,  dementia.  It is otherwise noted that he is Jehovah witness.  Clinical Impression  Pt admitted with above diagnosis.  PT states "no" however then cooperates with mobility; caregiver reports that he does that at baseline. He was amb household distances with rollator, 24 hour care from friends/caregivers. Min-mod assist of 2 for bed mobility and transfers this session. Will need SNF rehab prior to return home with previous care.  Will follow and progress as able in acute setting.  Pt currently with functional limitations due to the deficits listed below (see PT Problem List). Pt will benefit from skilled PT to increase their independence and safety with mobility to allow discharge to the venue listed below.          Recommendations for follow up therapy are one component of a multi-disciplinary discharge planning process, led by the attending physician.  Recommendations may be updated based on patient status, additional functional criteria and insurance authorization.  Follow Up Recommendations Skilled nursing-short term rehab (<3 hours/day) Can patient physically be transported by private vehicle: No    Assistance Recommended at Discharge Frequent or constant Supervision/Assistance  Patient can return home with the following  A lot of help with walking and/or transfers;A lot of help with bathing/dressing/bathroom;Assist for transportation;Assistance with cooking/housework;Help with stairs or ramp for entrance;Direct supervision/assist for medications management;Direct supervision/assist for financial management    Equipment Recommendations None recommended by PT  Recommendations for Other Services       Functional Status  Assessment Patient has had a recent decline in their functional status and demonstrates the ability to make significant improvements in function in a reasonable and predictable amount of time.     Precautions / Restrictions Precautions Precautions: Fall Restrictions Weight Bearing Restrictions: No RLE Weight Bearing: Weight bearing as tolerated      Mobility  Bed Mobility Overal bed mobility: Needs Assistance Bed Mobility: Supine to Sit, Sit to Supine     Supine to sit: Mod assist, +2 for physical assistance, +2 for safety/equipment Sit to supine: +2 for physical assistance, +2 for safety/equipment, Mod assist   General bed mobility comments: assist to progress LEs off bed an delevate trunk, cues for sequence; assist to control descent of trunk and bring RLE on to bed. incr time needed    Transfers Overall transfer level: Needs assistance Equipment used: Rolling walker (2 wheels) Transfers: Sit to/from Stand Sit to Stand: Min assist, +2 physical assistance, +2 safety/equipment           General transfer comment: multi-modal cues for transition to stand and to sit, pt pulled up  on RW (pulls up on rollator at baseline).    Ambulation/Gait               General Gait Details: deferred d/t pain/confusion. able to wt shift and take lateral steps along EOB  Stairs            Wheelchair Mobility    Modified Rankin (Stroke Patients Only)       Balance Overall balance assessment: Needs assistance Sitting-balance support: No upper extremity supported, Feet supported Sitting balance-Leahy Scale: Fair     Standing balance support: Reliant on assistive device for balance, During functional activity, Bilateral upper extremity supported Standing balance-Leahy  Scale: Poor Standing balance comment: reliant on RW and external assist                             Pertinent Vitals/Pain Pain Assessment Pain Assessment: Faces Faces Pain Scale: Hurts  little more Pain Descriptors / Indicators: Grimacing Pain Intervention(s): Limited activity within patient's tolerance, Monitored during session, Repositioned    Home Living Family/patient expects to be discharged to:: Skilled nursing facility Living Arrangements: Alone                 Additional Comments: amb with rollator, has 24hour care, friends/caregivers    Prior Function                       Hand Dominance        Extremity/Trunk Assessment   Upper Extremity Assessment Upper Extremity Assessment: Defer to OT evaluation    Lower Extremity Assessment Lower Extremity Assessment: Generalized weakness;RLE deficits/detail RLE Deficits / Details: AAROM grossly WFL within limits of pain RLE: Unable to fully assess due to pain       Communication   Communication: HOH;Expressive difficulties  Cognition Arousal/Alertness: Awake/alert Behavior During Therapy: Flat affect Overall Cognitive Status: History of cognitive impairments - at baseline                                 General Comments: states "no" to all questions/requests. follows direct one step commands with incr time; keeps eye closed  more than 50% time        General Comments      Exercises     Assessment/Plan    PT Assessment Patient needs continued PT services  PT Problem List Decreased strength;Decreased range of motion;Decreased activity tolerance;Decreased mobility;Decreased balance;Decreased safety awareness;Decreased knowledge of precautions;Decreased cognition       PT Treatment Interventions DME instruction;Therapeutic exercise;Gait training;Functional mobility training;Therapeutic activities;Patient/family education    PT Goals (Current goals can be found in the Care Plan section)  Acute Rehab PT Goals PT Goal Formulation: Patient unable to participate in goal setting Time For Goal Achievement: 06/05/22 Potential to Achieve Goals: Good    Frequency Min  3X/week     Co-evaluation               AM-PAC PT "6 Clicks" Mobility  Outcome Measure Help needed turning from your back to your side while in a flat bed without using bedrails?: Total Help needed moving from lying on your back to sitting on the side of a flat bed without using bedrails?: Total Help needed moving to and from a bed to a chair (including a wheelchair)?: Total Help needed standing up from a chair using your arms (e.g., wheelchair or bedside chair)?: Total Help needed to walk in hospital room?: Total Help needed climbing 3-5 steps with a railing? : Total 6 Click Score: 6    End of Session Equipment Utilized During Treatment: Gait belt Activity Tolerance: Patient tolerated treatment well;Patient limited by pain Patient left: in bed;with call bell/phone within reach;with bed alarm set;with family/visitor present Nurse Communication: Mobility status PT Visit Diagnosis: Other abnormalities of gait and mobility (R26.89);Difficulty in walking, not elsewhere classified (R26.2)    Time: 3664-4034 PT Time Calculation (min) (ACUTE ONLY): 16 min   Charges:   PT Evaluation $PT Eval Low Complexity: [place],  PT  Acute Rehab Dept [facility]) [phone]  [facility] Weekend Pager [facility] only)  [phone]  05/22/2022   [facility] 05/22/2022, 4:59 PM

## 2022-05-22 NOTE — Hospital Course (Addendum)
87 year old male with past medical history of Parkinson disease, abdominal aortic aneurysm, BPH, hyperlipidemia presenting to [facility] emergency department after suffering a mechanical fall down some stairs at home with right hip pain.  Upon evaluation in the emergency department patient was found to have a displaced and angulated subcapital right femoral neck fracture.  The hospitalist group was then called to assess the patient for admission to the hospital.  Dr. [name] surgery was consulted and patient underwent right total hip replacement on 1/9 without immediate complications.  Patient has been evaluated by physical therapy and skilled physical therapy services in a skilled nursing facility has been recommended.  Patient is additionally began to exhibit urinary retention requiring intermittent urinary catheterization and initiation of Flomax.  Patient required Foley catheter placement early in the morning on 1/12 due to severe urinary retention.

## 2022-05-22 NOTE — Progress Notes (Signed)
PROGRESS NOTE   Lee Martinez  YDX:[id number] DOB: Sep 18, 1935 DOA: 05/20/2022 PCP: [name], FNP   Date of Service: the patient was seen and examined on 05/22/2022  Brief Narrative:  87 year old male with past medical history of Parkinson disease, abdominal aortic aneurysm, BPH, hyperlipidemia presenting to [facility] emergency department after suffering a mechanical fall down some stairs at home with right hip pain.  Upon evaluation in the emergency department patient was found to have a displaced and angulated subcapital right femoral neck fracture.  The hospitalist group was then called to assess the patient for admission to the hospital.  Dr. [name] surgery was consulted and patient underwent right total hip replacement on 1/9 without immediate complications.   Assessment and Plan: * Closed displaced fracture of right femoral neck (HCC) Right femoral neck fracture after mechanical fall.   Patient is status post successful right hip arthroplasty performed by Dr. [name].  Patient now receiving aspirin 81 mg twice daily for DVT prophylaxis  Placing patient on scheduled Tylenol twice daily Patient additionally receiving as needed opiate-based analgesics for breakthrough pain  Patient has been evaluated by physical therapy and it is recommended the patient would benefit from skilled physical therapy services at a skilled nursing facility. Jehovah's Witness, does not want blood transfusions. Obtaining vitamin D level  Parkinson disease Continue home regimen of Sinemet  Mixed hyperlipidemia Resuming home regimen of statin therapy  Major depressive disorder Continuing home regimen of sertraline  Lesion of pelvic bone Stable on x-rays during this hospitalization compared to 12/2021. Apparently this has been worked up in the distant past and it is felt not to be malignant.  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    Subjective:  Patient complains of right hip pain, moderate intensity, occurring with movement of the affected extremity, radiating distally.  Physical Exam:  Vitals:   05/22/22 1016 05/22/22 1236 05/22/22 1816 05/22/22 2141  BP: 102/66 92/62 117/66 110/77  Pulse: 83 82 95 88  Resp: '18  17 18  '$ Temp: 97.6 F (36.4 C) 98.1 F (36.7 C) 98 F (36.7 C) 97.9 F (36.6 C)  TempSrc: Oral Axillary Oral Oral  SpO2: 94% 95% 93% 90%  Weight:      Height:         Constitutional: Awake alert and oriented x3, no associated distress.  Notably flat affect. Skin: no rashes, no lesions, poor skin turgor noted. Eyes: Pupils are equally reactive to light.  No evidence of scleral icterus or conjunctival pallor.  ENMT: Moist mucous membranes noted.  Posterior pharynx clear of any exudate or lesions.   Respiratory: clear to auscultation bilaterally, no wheezing, no crackles. Normal respiratory effort. No accessory muscle use.  Cardiovascular: Regular rate and rhythm, no murmurs / rubs / gallops. No extremity edema. 2+ pedal pulses. No carotid bruits.  Abdomen: Abdomen is soft and nontender.  No evidence of intra-abdominal masses.  Positive bowel sounds noted in all quadrants.   Musculoskeletal: Pain with both passive and active range of motion of the right hip.  no contractures.  Poor muscle tone.    Data Reviewed:  I have personally reviewed and interpreted labs, imaging.  Significant findings are   CBC: Recent Labs  Lab 05/20/22 1940 05/21/22 0349 05/22/22 0349  WBC 13.0* 9.9 13.4*  NEUTROABS 10.9*  --   --  HGB 13.5 13.8 10.4*  HCT 40.7 42.2 32.3*  MCV 92.9 96.6 97.6  PLT 199 188 793*   Basic Metabolic Panel: Recent Labs  Lab 05/20/22 1940 05/21/22 0349 05/22/22 0349  NA 137 138 139  K 3.9 4.1 4.7  CL 105 106 109  CO2 '23 24 22  '$ GLUCOSE 133* 134* 143*  BUN 28* 29* 28*  CREATININE 1.03 1.01 1.01   CALCIUM 9.3 9.1 8.3*   Coagulation Profile: Recent Labs  Lab 05/20/22 1940  INR 1.1     Code Status:  DNR.  Code status decision has been confirmed with: patient    Severity of Illness:  The appropriate patient status for this patient is INPATIENT. Inpatient status is judged to be reasonable and necessary in order to provide the required intensity of service to ensure the patient's safety. The patient's presenting symptoms, physical exam findings, and initial radiographic and laboratory data in the context of their chronic comorbidities is felt to place them at high risk for further clinical deterioration. Furthermore, it is not anticipated that the patient will be medically stable for discharge from the hospital within 2 midnights of admission.   * I certify that at the point of admission it is my clinical judgment that the patient will require inpatient hospital care spanning beyond 2 midnights from the point of admission due to high intensity of service, high risk for further deterioration and high frequency of surveillance required.*  Time spent:  54 minutes  Author:  Vernelle Emerald MD  05/22/2022 9:57 PM

## 2022-05-22 NOTE — Plan of Care (Signed)
Plan of care reviewed. Problem: Clinical Measurements: Goal: Ability to maintain clinical measurements within normal limits will improve Outcome: Progressing Goal: Will remain free from infection Outcome: Progressing Goal: Diagnostic test results will improve Outcome: Progressing Goal: Respiratory complications will improve Outcome: Progressing Goal: Cardiovascular complication will be avoided Outcome: Progressing   Problem: Health Behavior/Discharge Planning: Goal: Ability to manage health-related needs will improve Outcome: Progressing   Problem: Education: Goal: Knowledge of General Education information will improve Description: Including pain rating scale, medication(s)/side effects and non-pharmacologic comfort measures Outcome: Progressing   Problem: Nutrition: Goal: Adequate nutrition will be maintained Outcome: Progressing   Problem: Activity: Goal: Risk for activity intolerance will decrease Outcome: Progressing

## 2022-05-22 NOTE — Progress Notes (Addendum)
   Subjective: 1 Day Post-Op Procedure(s) (LRB): TOTAL HIP ARTHROPLASTY ANTERIOR APPROACH (Right)  Patient seen in rounds by Dr. [name]. Patient is resting in bed on exam this morning. He is confused this morning. Friend in room.  We will start therapy today.   Objective: Vital signs in last 24 hours: Temp:  [97.5 F (36.4 C)-98.7 F (37.1 C)] 98.2 F (36.8 C) (01/10 0551) Pulse Rate:  [79-99] 79 (01/10 0551) Resp:  [7-18] 16 (01/10 0551) BP: (89-141)/(41-85) 97/61 (01/10 0551) SpO2:  [90 %-100 %] 96 % (01/10 0551)  Intake/Output from previous day:  Intake/Output Summary (Last 24 hours) at 05/22/2022 0808 Last data filed at 05/22/2022 6712 Gross per 24 hour  Intake 2269.17 ml  Output 1775 ml  Net 494.17 ml     Intake/Output this shift: Total I/O In: 100 [IV Piggyback:100] Out: -   Labs: Recent Labs    05/20/22 1940 05/21/22 0349 05/22/22 0349  HGB 13.5 13.8 10.4*   Recent Labs    05/21/22 0349 05/22/22 0349  WBC 9.9 13.4*  RBC 4.37 3.31*  HCT 42.2 32.3*  PLT 188 149*   Recent Labs    05/21/22 0349 05/22/22 0349  NA 138 139  K 4.1 4.7  CL 106 109  CO2 24 22  BUN 29* 28*  CREATININE 1.01 1.01  GLUCOSE 134* 143*  CALCIUM 9.1 8.3*   Recent Labs    05/20/22 1940  INR 1.1    Exam: General - Patient is Alert Extremity - Neurologically intact Sensation intact distally Intact pulses distally Dorsiflexion/Plantar flexion intact Dressing - dressing C/D/I Motor Function - intact, moving foot and toes well on exam.   Past Medical History:  Diagnosis Date   Parkinson's disease 07/2011   followed by [facility] Neurology    Assessment/Plan: 1 Day Post-Op Procedure(s) (LRB): TOTAL HIP ARTHROPLASTY ANTERIOR APPROACH (Right) Principal Problem:   Closed displaced fracture of right femoral neck (HCC) Active Problems:   Parkinson's disease   Lesion of pelvic bone   Refusal of blood transfusions as patient is Jehovah's Witness   Closed nondisplaced  fracture of right acromial process with nonunion   Protein-calorie malnutrition, severe   S/P total right hip arthroplasty  Estimated body mass index is 18.54 kg/m as calculated from the following:   Height as of this encounter: 6' (1.829 m).   Weight as of this encounter: 62 kg. Advance diet Up with therapy  DVT Prophylaxis - Aspirin 81 mg BID x 4 weeks Weight bearing as tolerated.  ABLA: Hgb stable at 10.4 this AM - Does not accept blood products  Up with PT today.I have lowered pain medication to help with confusion hopefully.  Griffith Citron, PA-C Orthopedic Surgery [phone] 05/22/2022, 8:08 AM

## 2022-05-22 NOTE — Patient Outreach (Addendum)
  Care Coordination   Case Collaboration  Visit Note   05/22/2022 Name: Lee Martinez MRN: [id number] DOB: 05-29-1935  Lee Martinez is a 87 y.o. year old male who sees Lee Brine, FNP for primary care. I  collaborated with patients patients primary care provider.     Goals Addressed             This Visit's Progress    Care Coordination Activities       Care Coordination Interventions: Voice message left for Lee Martinez [phone]) to follow up on status of recent APS referral - voice message left requesting a return call Contacted main APS line, spoke with Lee Martinez who advises there is not an active report for patient Collaboration with patients primary care provider to advise that it seems case has been closed - unsure of outcome at this time SW will follow for post discharge needs        SDOH assessments and interventions completed:  No     Care Coordination Interventions:  Yes, provided   Follow up plan:  SW will continue to follow.    Encounter Outcome:  Pt. Visit Completed   [name], BSW, CDP Social Worker, Certified Dementia Practitioner [place] [facility]  Care Coordination [phone]

## 2022-05-22 NOTE — Assessment & Plan Note (Signed)
Continuing home regimen of sertraline

## 2022-05-23 DIAGNOSIS — G20A1 Parkinson's disease without dyskinesia, without mention of fluctuations: Secondary | ICD-10-CM | POA: Diagnosis not present

## 2022-05-23 DIAGNOSIS — N138 Other obstructive and reflux uropathy: Secondary | ICD-10-CM

## 2022-05-23 DIAGNOSIS — N401 Enlarged prostate with lower urinary tract symptoms: Secondary | ICD-10-CM

## 2022-05-23 DIAGNOSIS — E782 Mixed hyperlipidemia: Secondary | ICD-10-CM | POA: Diagnosis not present

## 2022-05-23 DIAGNOSIS — S72001A Fracture of unspecified part of neck of right femur, initial encounter for closed fracture: Secondary | ICD-10-CM | POA: Diagnosis not present

## 2022-05-23 LAB — BASIC METABOLIC PANEL
Anion gap: 5 (ref 5–15)
BUN: 28 mg/dL — ABNORMAL HIGH (ref 8–23)
CO2: 26 mmol/L (ref 22–32)
Calcium: 8.4 mg/dL — ABNORMAL LOW (ref 8.9–10.3)
Chloride: 106 mmol/L (ref 98–111)
Creatinine, Ser: 0.89 mg/dL (ref 0.61–1.24)
GFR, Estimated: >60 mL/min (ref >60)
Glucose, Bld: 112 mg/dL — ABNORMAL HIGH (ref 70–99)
Potassium: 4.5 mmol/L (ref 3.5–5.1)
Sodium: 137 mmol/L (ref 135–145)

## 2022-05-23 LAB — URINALYSIS, ROUTINE W REFLEX MICROSCOPIC
Bilirubin Urine: NEGATIVE
Glucose, UA: NEGATIVE mg/dL
Hgb urine dipstick: NEGATIVE
Ketones, ur: 5 mg/dL — AB
Leukocytes,Ua: NEGATIVE
Nitrite: NEGATIVE
Protein, ur: NEGATIVE mg/dL
Specific Gravity, Urine: 1.02 (ref 1.005–1.030)
pH: 5 (ref 5.0–8.0)

## 2022-05-23 LAB — CBC
HCT: 28.9 % — ABNORMAL LOW (ref 39.0–52.0)
Hemoglobin: 9.1 g/dL — ABNORMAL LOW (ref 13.0–17.0)
MCH: 31.2 pg (ref 26.0–34.0)
MCHC: 31.5 g/dL (ref 30.0–36.0)
MCV: 99 fL (ref 80.0–100.0)
Platelets: 137 10*3/uL — ABNORMAL LOW (ref 150–400)
RBC: 2.92 MIL/uL — ABNORMAL LOW (ref 4.22–5.81)
RDW: 14.1 % (ref 11.5–15.5)
WBC: 11.1 10*3/uL — ABNORMAL HIGH (ref 4.0–10.5)
nRBC: 0 % (ref 0.0–0.2)

## 2022-05-23 LAB — MAGNESIUM: Magnesium: 2 mg/dL (ref 1.7–2.4)

## 2022-05-23 MED ORDER — TAMSULOSIN HCL 0.4 MG PO CAPS
0.4000 mg | ORAL_CAPSULE | Freq: Every day | ORAL | Status: DC
  Administered 2022-05-23 – 2022-05-28 (×5): 0.4 mg via ORAL
  Filled 2022-05-23 (×6): qty 1

## 2022-05-23 MED ORDER — LACTATED RINGERS IV SOLN: INTRAVENOUS | Status: AC

## 2022-05-23 MED ORDER — LACTATED RINGERS IV BOLUS
500.0000 mL | Freq: Once | INTRAVENOUS | Status: AC
  Administered 2022-05-24: 500 mL via INTRAVENOUS

## 2022-05-23 NOTE — TOC PASRR Note (Signed)
[place] Note  Patient Details  Name: Lee Martinez Date of Birth: 11/05/1935  Transition of Care [facility]) CM/SW Contact:    Sherie Don, LCSW Phone Number: 05/23/2022, 2:35 PM  To Whom It May Concern:  Please be advised that this patient will require a short-term nursing home stay - anticipated 30 days or less for rehabilitation and strengthening. The plan is for return home.

## 2022-05-23 NOTE — Progress Notes (Signed)
PROGRESS NOTE   Lee MARRAZZO  XHB:[id number] DOB: 12-Jan-1936 DOA: 05/20/2022 PCP: [name], FNP   Date of Service: the patient was seen and examined on 05/23/2022  Brief Narrative:  87 year old male with past medical history of Parkinson disease, abdominal aortic aneurysm, BPH, hyperlipidemia presenting to [facility] emergency department after suffering a mechanical fall down some stairs at home with right hip pain.  Upon evaluation in the emergency department patient was found to have a displaced and angulated subcapital right femoral neck fracture.  The hospitalist group was then called to assess the patient for admission to the hospital.  Dr. [name] surgery was consulted and patient underwent right total hip replacement on 1/9 without immediate complications.  Patient has been evaluated by physical therapy and skilled physical therapy services in a skilled nursing facility has been recommended.  Patient is additionally began to exhibit urinary retention requiring intermittent urinary catheterization and initiation of Flomax.   Assessment and Plan: * Closed displaced fracture of right femoral neck (HCC) Right femoral neck fracture after mechanical fall.   Patient is status post successful right hip arthroplasty performed by Dr. [name] 1/9.  Patient now receiving aspirin 81 mg twice daily for DVT prophylaxis  Patient on scheduled Tylenol twice daily Patient additionally receiving as needed opiate-based analgesics for breakthrough pain  Patient has been evaluated by physical therapy and it is recommended the patient would benefit from skilled physical therapy services at a skilled nursing facility. Jehovah's Witness, does not want blood transfusions. Obtaining vitamin D level, still pending  BPH with urinary obstruction Known history of BPH Patient exhibiting lower urinary tract symptoms prior to surgery on 1/9 requiring Foley catheter placement. Foley was then removed on 1/10  however patient has been exhibiting increasing difficulty with urinating requiring intermittent catheterization this morning resulting in 650 cc of urine output. Initiating Flomax If retention recurs we will have to place another indwelling Foley catheter and discuss with urology  Parkinson disease Continue home regimen of Sinemet  Mixed hyperlipidemia Resuming home regimen of statin therapy  Lesion of pelvic bone Stable on x-rays during this hospitalization compared to 12/2021. Apparently this has been worked up in the distant past and it is felt not to be malignant.  Major depressive disorder Continuing home regimen of sertraline  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 present in Oct of 2023    Subjective:  Patient is complaining of right hip pain, mild to moderate intensity but can become severe on occasion with movement of the affected extremity, radiating distally and sharp in quality.  Patient additionally states that he is having difficulty with urinating but denies any associated abdominal pain.  Physical Exam:  Vitals:   05/22/22 2141 05/23/22 0612 05/23/22 1429 05/23/22 1636  BP: 110/77 117/70 (!) 83/50 96/61  Pulse: 88 81 84 86  Resp: '18 17 17 16  '$ Temp: 97.9 F (36.6 C) 97.6 F (36.4 C) 97.9 F (36.6 C)   TempSrc: Oral Oral    SpO2: 90% 96% 96% 97%  Weight:      Height:        Constitutional: Awake alert and oriented x3, no associated distress.   Skin: no rashes, no lesions, poor skin turgor noted. Eyes: Pupils are equally reactive to light.  No evidence of scleral icterus or conjunctival pallor.  ENMT: Moist mucous membranes noted.  Posterior pharynx clear of any exudate or lesions.   Respiratory: clear to auscultation bilaterally, no wheezing, no crackles. Normal respiratory  effort. No accessory muscle use.  Cardiovascular: Regular rate and rhythm, no murmurs / rubs / gallops. No extremity edema. 2+ pedal pulses. No carotid  bruits.  Abdomen: Abdomen is soft and nontender.  No evidence of intra-abdominal masses.  Positive bowel sounds noted in all quadrants.   Musculoskeletal: Pain with passive and active range of motion of the right hip.   Data Reviewed:  I have personally reviewed and interpreted labs, imaging.  Significant findings are   CBC: Recent Labs  Lab 05/20/22 1940 05/21/22 0349 05/22/22 0349 05/23/22 0350  WBC 13.0* 9.9 13.4* 11.1*  NEUTROABS 10.9*  --   --   --   HGB 13.5 13.8 10.4* 9.1*  HCT 40.7 42.2 32.3* 28.9*  MCV 92.9 96.6 97.6 99.0  PLT 199 188 149* 413*   Basic Metabolic Panel: Recent Labs  Lab 05/20/22 1940 05/21/22 0349 05/22/22 0349 05/23/22 0350  NA 137 138 139 137  K 3.9 4.1 4.7 4.5  CL 105 106 109 106  CO2 '23 24 22 26  '$ GLUCOSE 133* 134* 143* 112*  BUN 28* 29* 28* 28*  CREATININE 1.03 1.01 1.01 0.89  CALCIUM 9.3 9.1 8.3* 8.4*  MG  --   --   --  2.0     Coagulation Profile: Recent Labs  Lab 05/20/22 1940  INR 1.1       Code Status:  DNR.  Code status decision has been confirmed with: patient    Severity of Illness:  The appropriate patient status for this patient is INPATIENT. Inpatient status is judged to be reasonable and necessary in order to provide the required intensity of service to ensure the patient's safety. The patient's presenting symptoms, physical exam findings, and initial radiographic and laboratory data in the context of their chronic comorbidities is felt to place them at high risk for further clinical deterioration. Furthermore, it is not anticipated that the patient will be medically stable for discharge from the hospital within 2 midnights of admission.   * I certify that at the point of admission it is my clinical judgment that the patient will require inpatient hospital care spanning beyond 2 midnights from the point of admission due to high intensity of service, high risk for further deterioration and high frequency of  surveillance required.*  Time spent:  50 minutes  Author:  Vernelle Emerald MD  05/23/2022 9:49 PM

## 2022-05-23 NOTE — Assessment & Plan Note (Addendum)
Known history of BPH Required Foley catheter placement on 1/9 requiring due to urinary retention This was then removed on 1/10 post operatively however patient failed voiding trial ultimately resulting in reinsertion of Foley catheter morning of 1/12 with the assistance of Dr. [name] with urology, their assistance is appreciated. Continuing Flomax once daily, patient having lightheadedness and therefore will not titrate to twice daily for now.   Urology recommending another voiding trial beginning approximately 1/19.  If patient is already at his skilled nursing facility at that time they can attempt a voiding trial there.

## 2022-05-23 NOTE — Plan of Care (Signed)
Plan of care reviewed and discussed. °

## 2022-05-23 NOTE — NC FL2 (Signed)
[place] LEVEL OF CARE FORM     IDENTIFICATION  Patient Name: Lee Martinez Birthdate: [birth year] Sex: male Admission Date (Current Location): 05/20/2022  [facility] and [place] Number:  Herbalist and Address:  [facility],  [address] [place], [place]      Provider Number: [id number]  Attending Physician Name and Address:  Vernelle Emerald, MD  Relative Name and Phone Number:  [name] ([place]) Ph: [phone]    Current Level of Care: Hospital Recommended Level of Care: [place] Prior Approval Number:    Date Approved/Denied:   PASRR Number: Pending  Discharge Plan: SNF    Current Diagnoses: Patient Active Problem List   Diagnosis Date Noted   Mixed hyperlipidemia 05/22/2022   Protein-calorie malnutrition, severe 05/21/2022   S/P total right hip arthroplasty 05/21/2022   Lesion of pelvic bone 05/20/2022   Closed displaced fracture of right femoral neck ([place]) 05/20/2022   Refusal of blood transfusions as patient is Jehovah's Witness 05/20/2022   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 05/20/2022   Major depressive disorder 06/17/2013   Mood disorder in conditions classified elsewhere 03/31/2013   Parkinson disease 01/19/2013    Orientation RESPIRATION BLADDER Height & Weight     Self  Normal Continent Weight: 136 lb 11 oz (62 kg) Height:  6' (182.9 cm)  BEHAVIORAL SYMPTOMS/MOOD NEUROLOGICAL BOWEL NUTRITION STATUS   (N/A)  (N/A) Continent Diet (Regular diet)  AMBULATORY STATUS COMMUNICATION OF NEEDS Skin   Extensive Assist Verbally Skin abrasions, Other (Comment), Surgical wounds (Abrasion: right leg; Ecchymosis: bilateral arms & hips)                       Personal Care Assistance Level of Assistance  Bathing, Feeding, Dressing Bathing Assistance: Maximum assistance Feeding assistance: Independent Dressing Assistance: Maximum assistance      Functional Limitations Info  Sight, Hearing, Speech Sight Info: Impaired Hearing Info: Impaired Speech Info: Adequate    SPECIAL CARE FACTORS FREQUENCY  PT (By licensed PT), OT (By licensed OT)     PT Frequency: 5x's/week OT Frequency: 5x's/week            Contractures Contractures Info: Not present    Additional Factors Info  Code Status, Allergies, Psychotropic Code Status Info: DNR Allergies Info: NKA Psychotropic Info: Zolft         Current Medications (05/23/2022):  This is the current hospital active medication list Current Facility-Administered Medications  Medication Dose Route Frequency Provider Last Rate Last Admin   acetaminophen (TYLENOL) tablet 325-650 mg  325-650 mg Oral Q6H PRN Irving Copas, PA-C       aspirin chewable tablet 81 mg  81 mg Oral BID Irving Copas, PA-C   81 mg at 05/23/22 0935   atorvastatin (LIPITOR) tablet 10 mg  10 mg Oral Q M,W,F-2000 Shalhoub, Sherryll Burger, MD   10 mg at 05/23/22 0015   bisacodyl (DULCOLAX) suppository 10 mg  10 mg Rectal Daily PRN Irving Copas, PA-C   10 mg at 05/23/22 8676   carbidopa-levodopa (SINEMET IR) 25-100 MG per tablet immediate release 3 tablet  3 tablet Oral TID Vernelle Emerald, MD   3 tablet at 05/23/22 0935   diphenhydrAMINE (BENADRYL) 12.5 MG/5ML elixir 12.5-25 mg  12.5-25 mg Oral Q4H PRN Irving Copas, PA-C   25 mg at 05/23/22 0026   docusate sodium (COLACE) capsule 100 mg  100 mg Oral BID  Irving Copas, PA-C   100 mg at 05/23/22 0935   HYDROcodone-acetaminophen (NORCO/VICODIN) 5-325 MG per tablet 1-2 tablet  1-2 tablet Oral Q4H PRN Irving Copas, PA-C   1 tablet at 05/23/22 0935   lactose free nutrition (BOOST PLUS) liquid 237 mL  237 mL Oral TID WC Vernelle Emerald, MD   237 mL at 05/22/22 1647   menthol-cetylpyridinium (CEPACOL) lozenge 3 mg  1 lozenge Oral PRN Irving Copas, PA-C       Or   phenol (CHLORASEPTIC) mouth spray 1 spray  1 spray Mouth/Throat PRN Irving Copas, PA-C       methocarbamol (ROBAXIN) tablet 500 mg  500 mg Oral Q6H PRN Irving Copas, PA-C       Or   methocarbamol (ROBAXIN) 500 mg in dextrose 5 % 50 mL IVPB  500 mg Intravenous Q6H PRN Irving Copas, PA-C   Stopped at 05/21/22 2030   metoCLOPramide (REGLAN) tablet 5-10 mg  5-10 mg Oral Q8H PRN Irving Copas, PA-C       Or   metoCLOPramide (REGLAN) injection 5-10 mg  5-10 mg Intravenous Q8H PRN Irving Copas, PA-C       morphine (PF) 2 MG/ML injection 0.5-1 mg  0.5-1 mg Intravenous Q2H PRN Costella Hatcher R, PA-C   1 mg at 05/21/22 2202   ondansetron (ZOFRAN) tablet 4 mg  4 mg Oral Q6H PRN Irving Copas, PA-C       Or   ondansetron [facility]) injection 4 mg  4 mg Intravenous Q6H PRN Costella Hatcher R, PA-C       polyethylene glycol (MIRALAX / GLYCOLAX) packet 17 g  17 g Oral BID Irving Copas, PA-C   17 g at 05/23/22 0015   sertraline (ZOLOFT) tablet 50 mg  50 mg Oral Daily Irving Copas, PA-C   50 mg at 05/23/22 0935   tranexamic acid (CYKLOKAPRON) IVPB 1,000 mg  1,000 mg Intravenous Once Irving Copas, PA-C         Discharge Medications: Please see discharge summary for a list of discharge medications.  Relevant Imaging Results:  Relevant Lab Results:   Additional Information SSN: [id number]  Sherie Don, LCSW

## 2022-05-23 NOTE — TOC Initial Note (Signed)
Transition of Care [facility]) - Initial/Assessment Note   Patient Details  Name: Lee Martinez MRN: [id number] Date of Birth: January 01, 1936  [facility] of [facility]) CM/SW Contact:    Lee Don, LCSW Phone Number: 05/23/2022, 1:08 PM  Clinical Narrative: PT evaluation recommended SNF. Lee Martinez, agreeable to rehab. FL2 done; PASRR pending. Clinicals uploaded to [facility] for review. Initial referral for SNF faxed out. TOC awaiting PASRR number and bed offers.  Expected Discharge Plan: Skilled Nursing Facility Barriers to Discharge: Continued Medical Work up, Ship broker, SNF Pending bed offer  Patient Goals and CMS Choice Patient states their goals for this hospitalization and ongoing recovery are:: Go to rehab before returning home CMS Medicare.gov Compare Post Acute Care list provided to:: Patient Represenative (must comment) Lee Martinez (grandson/POA)) Choice offered to / list presented to : [place] / Guardian  Expected Discharge Plan and Services In-house Referral: Clinical Social Work Post Acute Care Choice: [place] Living arrangements for the past 2 months: Apartment             DME Arranged: N/A DME Agency: NA  Prior Living Arrangements/Services Living arrangements for the past 2 months: Apartment Patient language and need for interpreter reviewed:: Yes Do you feel safe going back to the place where you live?: Yes      Need for Family Participation in Patient Care: Yes (Comment) (Patient is currently oriented to self only.) Care giver support system in place?: Yes (comment) Criminal Activity/Legal Involvement Pertinent to Current Situation/Hospitalization: No - Comment as needed  Activities of Daily Living Home Assistive Devices/Equipment: Grab bars around toilet, Grab bars in shower, Shower chair without back ADL Screening (condition at time of admission) Patient's cognitive ability adequate to safely complete daily  activities?: Yes Is the patient deaf or have difficulty hearing?: No Does the patient have difficulty seeing, even when wearing glasses/contacts?: No Does the patient have difficulty concentrating, remembering, or making decisions?: Yes ("sometimes") Patient able to express need for assistance with ADLs?: Yes Does the patient have difficulty dressing or bathing?: No Independently performs ADLs?: Yes (appropriate for developmental age) Does the patient have difficulty walking or climbing stairs?: Yes Weakness of Legs: Right Weakness of Arms/Hands: None  Permission Sought/Granted Permission sought to share information with : Facility Art therapist granted to share information with : Yes, Verbal Permission Granted Permission granted to share info w AGENCY: SNFs  Emotional Assessment Orientation: : Oriented to Self Alcohol / Substance Use: Not Applicable  Admission diagnosis:  Closed fracture of right hip, initial encounter ([place]) [S72.001A] Closed displaced fracture of right femoral neck ([place]) [S72.001A] Patient Active Problem List   Diagnosis Date Noted   Mixed hyperlipidemia 05/22/2022   Protein-calorie malnutrition, severe 05/21/2022   S/P total right hip arthroplasty 05/21/2022   Lesion of pelvic bone 05/20/2022   Closed displaced fracture of right femoral neck ([place]) 05/20/2022   Refusal of blood transfusions as patient is Jehovah's Witness 05/20/2022   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 05/20/2022   Major depressive disorder 06/17/2013   Mood disorder in conditions classified elsewhere 03/31/2013   Parkinson disease 01/19/2013   PCP:  Lee Martinez, [place] [facility]:   [facility] [phone] [place], [place] - [place] [address] Phone: [phone] Fax: [phone]  [facility] ([place]) - [place], [place] [facility] [address]  [place] [address] [place] [address] Phone: [phone]  Fax: [place], [place] [address] Phone: [phone] Fax: [phone]  [facility] [place], [place] [address] Phone: [phone] Fax: [phone]  Social Determinants of Health (SDOH) Social History: [place]: No Food Insecurity (05/20/2022)  Housing: Low Risk  (05/20/2022)  Transportation Needs: No Transportation Needs (05/20/2022)  Utilities: Not At Risk (05/20/2022)  Depression (PHQ2-9): Medium Risk (02/07/2022)  Financial Resource Strain: Low Risk  (02/07/2022)  Physical Activity: Inactive (02/07/2022)  Stress: No Stress Concern Present (02/07/2022)  Tobacco Use: Low Risk  (05/22/2022)   SDOH Interventions:    Readmission Risk Interventions     No data to display

## 2022-05-24 ENCOUNTER — Ambulatory Visit: Payer: Self-pay

## 2022-05-24 DIAGNOSIS — N401 Enlarged prostate with lower urinary tract symptoms: Secondary | ICD-10-CM | POA: Diagnosis not present

## 2022-05-24 DIAGNOSIS — E43 Unspecified severe protein-calorie malnutrition: Secondary | ICD-10-CM

## 2022-05-24 DIAGNOSIS — G20A1 Parkinson's disease without dyskinesia, without mention of fluctuations: Secondary | ICD-10-CM | POA: Diagnosis not present

## 2022-05-24 DIAGNOSIS — S72001A Fracture of unspecified part of neck of right femur, initial encounter for closed fracture: Secondary | ICD-10-CM | POA: Diagnosis not present

## 2022-05-24 DIAGNOSIS — E782 Mixed hyperlipidemia: Secondary | ICD-10-CM | POA: Diagnosis not present

## 2022-05-24 LAB — BASIC METABOLIC PANEL
Anion gap: 8 (ref 5–15)
BUN: 21 mg/dL (ref 8–23)
CO2: 23 mmol/L (ref 22–32)
Calcium: 8.3 mg/dL — ABNORMAL LOW (ref 8.9–10.3)
Chloride: 104 mmol/L (ref 98–111)
Creatinine, Ser: 0.85 mg/dL (ref 0.61–1.24)
GFR, Estimated: >60 mL/min (ref >60)
Glucose, Bld: 118 mg/dL — ABNORMAL HIGH (ref 70–99)
Potassium: 3.9 mmol/L (ref 3.5–5.1)
Sodium: 135 mmol/L (ref 135–145)

## 2022-05-24 LAB — CBC
HCT: 30.2 % — ABNORMAL LOW (ref 39.0–52.0)
Hemoglobin: 9.8 g/dL — ABNORMAL LOW (ref 13.0–17.0)
MCH: 31 pg (ref 26.0–34.0)
MCHC: 32.5 g/dL (ref 30.0–36.0)
MCV: 95.6 fL (ref 80.0–100.0)
Platelets: 158 10*3/uL (ref 150–400)
RBC: 3.16 MIL/uL — ABNORMAL LOW (ref 4.22–5.81)
RDW: 13.9 % (ref 11.5–15.5)
WBC: 7.8 10*3/uL (ref 4.0–10.5)
nRBC: 0 % (ref 0.0–0.2)

## 2022-05-24 LAB — MAGNESIUM: Magnesium: 1.8 mg/dL (ref 1.7–2.4)

## 2022-05-24 MED ORDER — LACTATED RINGERS IV BOLUS
500.0000 mL | Freq: Once | INTRAVENOUS | Status: AC
  Administered 2022-05-24: 500 mL via INTRAVENOUS

## 2022-05-24 NOTE — Progress Notes (Signed)
Paged Lee Martinez to inquire about performing an in & out cath based on residual scan.

## 2022-05-24 NOTE — TOC Progression Note (Signed)
Transition of Care [facility]) - Progression Note   Patient Details  Name: Lee Martinez MRN: [id number] Date of Birth: 04-07-1936  [facility]) CM/SW [employer], LCSW Phone Number: 05/24/2022, 4:12 PM  Clinical Narrative: CSW spoke with grandson, who requested that CSW follow up with family friend/HC POA, [name] Minor, to discuss bed offers. [place] was selected. CSW followed up with Lexine Baton in admissions at [facility]. Patient can likely be admitted Monday (05/27/22) as the facility has several Medicare appeals pending at this time. Patient will need insurance authorization once it is confirmed the bed will be available.  Expected Discharge Plan: [place] Barriers to Discharge: Continued Medical Work up, Ship broker, SNF Pending bed offer  Expected Discharge Plan and Services In-house Referral: Clinical Social Work Post Acute Care Choice: [place] Living arrangements for the past 2 months: Apartment            DME Arranged: N/A DME Agency: NA  Social Determinants of Health (SDOH) Interventions SDOH Screenings   Food Insecurity: No Food Insecurity (05/20/2022)  Housing: Low Risk  (05/20/2022)  Transportation Needs: No Transportation Needs (05/20/2022)  Utilities: Not At Risk (05/20/2022)  Depression (PHQ2-9): Medium Risk (02/07/2022)  Financial Resource Strain: Low Risk  (02/07/2022)  Physical Activity: Inactive (02/07/2022)  Stress: No Stress Concern Present (02/07/2022)  Tobacco Use: Low Risk  (05/22/2022)   Readmission Risk Interventions     No data to display

## 2022-05-24 NOTE — Progress Notes (Addendum)
CROSS COVER NOTE  NAME: Lee Martinez MRN: [id number] DOB : 1936-03-28   Bladder scan reading almost 400 cc.  In-N-Out cath attempted, but was unsuccessful.  RN reported bleeding and obstruction after in and out cath.   Spoke with Dr. [name] this morning for urology consultation.  Patient will be seen today.  Raenette Rover, DNP, [place]

## 2022-05-24 NOTE — Progress Notes (Addendum)
PROGRESS NOTE   Lee Martinez  VOH:[id number] DOB: 10/10/1935 DOA: 05/20/2022 PCP: [name], FNP   Date of Service: the patient was seen and examined on 05/24/2022  Brief Narrative:  87 year old male with past medical history of Parkinson disease, abdominal aortic aneurysm, BPH, hyperlipidemia presenting to [facility] long hospital emergency department after suffering a mechanical fall down some stairs at home with right hip pain.  Upon evaluation in the emergency department patient was found to have a displaced and angulated subcapital right femoral neck fracture.  The hospitalist group was then called to assess the patient for admission to the hospital.  Dr. [name] surgery was consulted and patient underwent right total hip replacement on 1/9 without immediate complications.  Patient has been evaluated by physical therapy and skilled physical therapy services in a skilled nursing facility has been recommended.  Patient is additionally began to exhibit urinary retention requiring intermittent urinary catheterization and initiation of Flomax.  Patient required Foley catheter placement early in the morning on 1/12 due to severe urinary retention.   Assessment and Plan: * Closed displaced fracture of right femoral neck (HCC) Right femoral neck fracture after mechanical fall.   Patient is status post successful right hip arthroplasty performed by Dr. [name] 1/9.  Patient now receiving aspirin 81 mg twice daily for DVT prophylaxis  Patient on scheduled Tylenol twice daily for pain control Patient additionally receiving as needed opiate-based analgesics for breakthrough pain  Patient has been evaluated by physical therapy and it is recommended the patient would benefit from skilled physical therapy services at a skilled nursing facility. Awaiting skilled nursing facility placement Jehovah's Witness, does not want blood transfusions. Obtaining vitamin D level, still pending  BPH with urinary  obstruction Known history of BPH Patient exhibiting lower urinary tract symptoms prior to surgery on 1/9 requiring Foley catheter placement. Foley was then removed on 1/10 however patient has been exhibiting increasing difficulty with urinating requiring intermittent catheterization and ultimately reinsertion of Foley catheter morning of 1/12 with the assistance of Dr. [name] with urology, their assistance is appreciated. Continuing Flomax   Parkinson disease Continue home regimen of Sinemet  Mixed hyperlipidemia Resuming home regimen of statin therapy  Lesion of pelvic bone Stable on x-rays during this hospitalization compared to 12/2021. Apparently this has been worked up in the distant past and it is felt not to be malignant.  Major depressive disorder Continuing home regimen of sertraline  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 present in Oct of 2023    Subjective:  Patient reports that his right hip pain is improving.  Pain is moderate in intensity, worse with movement and improved with rest.  Patient states that he was having severe lower abdominal pain earlier this morning when he could not urinate but his pain is now much improved status post Foley catheter placement.  Physical Exam:  Vitals:   05/24/22 0636 05/24/22 1132 05/24/22 1328 05/24/22 1706  BP:  98/61 (!) 91/56 110/62  Pulse: 85 82 86 84  Resp:  '18 18 16  '$ Temp:  97.7 F (36.5 C) 97.9 F (36.6 C) 97.7 F (36.5 C)  TempSrc:  Oral Oral Oral  SpO2: 90% 96% 96% 95%  Weight:      Height:        Constitutional: Awake alert and oriented x3, no associated distress.   Skin: no rashes, no lesions, poor skin turgor noted. Eyes: Pupils are equally reactive to light.  No evidence of scleral icterus or conjunctival  pallor.  ENMT: Moist mucous membranes noted.  Posterior pharynx clear of any exudate or lesions.   Respiratory: clear to auscultation bilaterally, no wheezing, no crackles.  Normal respiratory effort. No accessory muscle use.  Cardiovascular: Regular rate and rhythm, no murmurs / rubs / gallops. No extremity edema. 2+ pedal pulses. No carotid bruits.  Abdomen: Abdomen is soft and nontender.  No evidence of intra-abdominal masses.  Positive bowel sounds noted in all quadrants.   Musculoskeletal: Pain with passive and active range of motion of the right hip. GU: Foley catheter is now in place draining amber-colored urine.   Data Reviewed:  I have personally reviewed and interpreted labs, imaging.  Significant findings are   CBC: Recent Labs  Lab 05/20/22 1940 05/21/22 0349 05/22/22 0349 05/23/22 0350 05/24/22 0328  WBC 13.0* 9.9 13.4* 11.1* 7.8  NEUTROABS 10.9*  --   --   --   --   HGB 13.5 13.8 10.4* 9.1* 9.8*  HCT 40.7 42.2 32.3* 28.9* 30.2*  MCV 92.9 96.6 97.6 99.0 95.6  PLT 199 188 149* 137* 468   Basic Metabolic Panel: Recent Labs  Lab 05/20/22 1940 05/21/22 0349 05/22/22 0349 05/23/22 0350 05/24/22 0328  NA 137 138 139 137 135  K 3.9 4.1 4.7 4.5 3.9  CL 105 106 109 106 104  CO2 '23 24 22 26 23  '$ GLUCOSE 133* 134* 143* 112* 118*  BUN 28* 29* 28* 28* 21  CREATININE 1.03 1.01 1.01 0.89 0.85  CALCIUM 9.3 9.1 8.3* 8.4* 8.3*  MG  --   --   --  2.0 1.8     Coagulation Profile: Recent Labs  Lab 05/20/22 1940  INR 1.1       Code Status:  DNR.  Code status decision has been confirmed with: patient    Severity of Illness:  The appropriate patient status for this patient is INPATIENT. Inpatient status is judged to be reasonable and necessary in order to provide the required intensity of service to ensure the patient's safety. The patient's presenting symptoms, physical exam findings, and initial radiographic and laboratory data in the context of their chronic comorbidities is felt to place them at high risk for further clinical deterioration. Furthermore, it is not anticipated that the patient will be medically stable for discharge  from the hospital within 2 midnights of admission.   * I certify that at the point of admission it is my clinical judgment that the patient will require inpatient hospital care spanning beyond 2 midnights from the point of admission due to high intensity of service, high risk for further deterioration and high frequency of surveillance required.*  Time spent:  52 minutes  Author:  Vernelle Emerald MD  05/24/2022 8:36 PM

## 2022-05-24 NOTE — Progress Notes (Signed)
Attempted to In & Out cath the patient and the catheter met resistance and then on removal, a moderate amount of blood came out of the penis, but then stopped bleeding. Messaged Raenette Rover.

## 2022-05-24 NOTE — Progress Notes (Signed)
The patient had a bladder scan of 593, then stood and urinated 200 mL. The residual scan was 392. Messaged Dr. Cyd Silence.

## 2022-05-24 NOTE — Patient Outreach (Signed)
  Care Coordination   Collaboration  Visit Note   05/24/2022 Name: THEUS ESPIN MRN: [id number] DOB: [birth year]  KAMARON DESKINS is a 87 y.o. year old male who sees [name], FNP for primary care. I  collaborated with patients primary care provider.   Goals Addressed             This Visit's Progress    COMPLETED: Care Coordination Activities       Care Coordination Interventions: Voice message received from Chauncy Lean [phone]) who indicates patients APS case was closed after plans for patients POA to arrange in home care  were identified Performed chart review to note patient remains inpatient with plans for patient to discharge to SNF for short term rehab Contacted patients POA and left phone number for SW should the patient need resource assistance post hospital d/c Collaboration with primary care provider requesting she involve care coordination team as needed if/when patient returns home from rehab        SDOH assessments and interventions completed:  No     Care Coordination Interventions:  Yes, provided   Follow up plan: No further intervention required.   Encounter Outcome:  Pt. Visit Completed   [name], BSW, CDP Social Worker, Certified Dementia Practitioner [place] [facility]  Care Coordination [phone]

## 2022-05-24 NOTE — Progress Notes (Signed)
Physical Therapy Treatment Patient Details Name: Lee Martinez MRN: [id number] DOB: [birth year] Today's Date: 05/24/2022   History of Present Illness 87 y.o. male adm after fall resulting in femoral neck fx. pt is s/p R AA THA on 05/21/21. PMH: Parkinson's disease,  dementia.  It is otherwise noted that he is Jehovah witness.    PT Comments    Pt initially reluctant to participate but with encouragement moved well and progressed to ambulating short distance in room.  Pt verbalizing minimally but with questioning admits to feeling "a little" dizzy after ambulating 6' - BP 83/60 - pt returned to bed with assist and BP up to 98/61 - RN aware.  Recommendations for follow up therapy are one component of a multi-disciplinary discharge planning process, led by the attending physician.  Recommendations may be updated based on patient status, additional functional criteria and insurance authorization.  Follow Up Recommendations  Skilled nursing-short term rehab (<3 hours/day) Can patient physically be transported by private vehicle: No   Assistance Recommended at Discharge Frequent or constant Supervision/Assistance  Patient can return home with the following A lot of help with walking and/or transfers;A lot of help with bathing/dressing/bathroom;Assist for transportation;Assistance with cooking/housework;Help with stairs or ramp for entrance;Direct supervision/assist for medications management;Direct supervision/assist for financial management   Equipment Recommendations  None recommended by PT    Recommendations for Other Services       Precautions / Restrictions Precautions Precautions: Fall Precaution Comments: Pt orthostatic 1/12 Restrictions Weight Bearing Restrictions: No RLE Weight Bearing: Weight bearing as tolerated     Mobility  Bed Mobility Overal bed mobility: Needs Assistance Bed Mobility: Supine to Sit, Sit to Supine     Supine to sit: Mod assist, +2 for physical  assistance, +2 for safety/equipment Sit to supine: +2 for physical assistance, +2 for safety/equipment, Mod assist   General bed mobility comments: Assist to manage LEs, control trunk and complete rotation to/from EOB using pad    Transfers Overall transfer level: Needs assistance Equipment used: Rolling walker (2 wheels) Transfers: Sit to/from Stand Sit to Stand: Min assist, +2 physical assistance, +2 safety/equipment           General transfer comment: cues for LE management and use of UEs to self assist    Ambulation/Gait Ambulation/Gait assistance: Min assist, +2 physical assistance, +2 safety/equipment Gait Distance (Feet): 6 Feet Assistive device: Rolling walker (2 wheels) Gait Pattern/deviations: Step-to pattern, Decreased step length - right, Decreased step length - left, Shuffle, Trunk flexed Gait velocity: decr     General Gait Details: Assist for balance/support and RW management with cues for posture, position from RW and sequence.  Distance ltd by c/o dizziness and fatigue   Stairs             Wheelchair Mobility    Modified Rankin (Stroke Patients Only)       Balance Overall balance assessment: Needs assistance Sitting-balance support: No upper extremity supported, Feet supported Sitting balance-Leahy Scale: Fair     Standing balance support: Reliant on assistive device for balance, During functional activity, Bilateral upper extremity supported Standing balance-Leahy Scale: Poor                              Cognition Arousal/Alertness: Awake/alert Behavior During Therapy: Flat affect Overall Cognitive Status: History of cognitive impairments - at baseline  General Comments: Says "no" to many requests but with encouragement will participate        Exercises Total Joint Exercises Ankle Circles/Pumps: AROM, Both, 15 reps, Supine Quad Sets: AROM, Both, 5 reps, Supine Heel Slides:  AAROM, Right, 15 reps, Supine Hip ABduction/ADduction: AAROM, Right, 15 reps, Supine    General Comments        Pertinent Vitals/Pain Pain Assessment Pain Assessment: Faces Faces Pain Scale: Hurts little more Pain Descriptors / Indicators: Grimacing Pain Intervention(s): Limited activity within patient's tolerance, Monitored during session, Premedicated before session    Home Living                          Prior Function            PT Goals (current goals can now be found in the care plan section) Acute Rehab PT Goals PT Goal Formulation: Patient unable to participate in goal setting Time For Goal Achievement: 06/05/22 Potential to Achieve Goals: Good Progress towards PT goals: Progressing toward goals    Frequency    Min 3X/week      PT Plan Current plan remains appropriate    Co-evaluation              AM-PAC PT "6 Clicks" Mobility   Outcome Measure  Help needed turning from your back to your side while in a flat bed without using bedrails?: A Lot Help needed moving from lying on your back to sitting on the side of a flat bed without using bedrails?: A Lot Help needed moving to and from a bed to a chair (including a wheelchair)?: A Lot Help needed standing up from a chair using your arms (e.g., wheelchair or bedside chair)?: A Lot Help needed to walk in hospital room?: A Lot Help needed climbing 3-5 steps with a railing? : Total 6 Click Score: 11    End of Session Equipment Utilized During Treatment: Gait belt Activity Tolerance: Other (comment) (orthostatic) Patient left: in bed;with call bell/phone within reach;with bed alarm set;with family/visitor present Nurse Communication: Mobility status;Other (comment) (orthostatic BP) PT Visit Diagnosis: Other abnormalities of gait and mobility (R26.89);Difficulty in walking, not elsewhere classified (R26.2)     Time: 5670-1410 PT Time Calculation (min) (ACUTE ONLY): 27 min  Charges:  $Gait  Training: 8-22 mins $Therapeutic Exercise: 8-22 mins                     Debe Coder PT Acute Rehabilitation Services Pager [phone] Office [phone]    [facility] 05/24/2022, 12:27 PM

## 2022-05-24 NOTE — Consult Note (Signed)
I have been asked to see the patient by Dr. Inda Merlin, for evaluation and management of urinary retention.  History of present illness: 87 year old male with a history of Parkinson's disease who was admitted to the hospital after fracturing his hip.  He he subsequently had a total hip replacement 3 days ago.  Since that time he has been unable to void.  The nursing staff has been performing clean intermittent catheterization, but ultimately he has been unable to void on his own.  The most recent catheterization was failed and blood returned instead of urine.  As result, I was consulted for further evaluation and management.  The patient does have a history of BPH, had seen Dr. Karsten Ro more than 10 years ago in clinic.  At that time he was taking tamsulosin.  The patient does not relate a significant history of BPH symptoms, but notably he is significantly confused today.  He is not taking tamsulosin or any other BPH medications.  Review of systems: A 12 point comprehensive review of systems was obtained and is negative unless otherwise stated in the history of present illness.  Patient Active Problem List   Diagnosis Date Noted   BPH with urinary obstruction 05/23/2022   Mixed hyperlipidemia 05/22/2022   Protein-calorie malnutrition, severe 05/21/2022   S/P total right hip arthroplasty 05/21/2022   Lesion of pelvic bone 05/20/2022   Closed displaced fracture of right femoral neck ([place]) 05/20/2022   Refusal of blood transfusions as patient is Jehovah's Witness 05/20/2022   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 05/20/2022   Major depressive disorder 06/17/2013   Mood disorder in conditions classified elsewhere 03/31/2013   Parkinson disease 01/19/2013    No current facility-administered medications on file prior to encounter.   Current Outpatient Medications on File Prior to Encounter  Medication Sig Dispense Refill   acetaminophen (TYLENOL) 500 MG  tablet Take 1 tablet (500 mg total) by mouth every 6 (six) hours as needed. 30 tablet 0   atorvastatin (LIPITOR) 10 MG tablet Take 1 tab daily in evening MWF 45 tablet 1   carbidopa-levodopa (SINEMET IR) 25-100 MG tablet Take 2 tablets by mouth 3 (three) times daily. 540 tablet 1   sertraline (ZOLOFT) 50 MG tablet Take 1 tablet (50 mg total) by mouth daily. 90 tablet 1    Past Medical History:  Diagnosis Date   Parkinson's disease 07/2011   followed by [facility]    Past Surgical History:  Procedure Laterality Date   basil removal     HERNIA REPAIR     THYROID SURGERY     TOTAL HIP ARTHROPLASTY Right 05/21/2022   Procedure: TOTAL HIP ARTHROPLASTY ANTERIOR APPROACH;  Surgeon: Paralee Cancel, MD;  Location: [facility];  Service: Orthopedics;  Laterality: Right;    Social History   Tobacco Use   Smoking status: Never   Smokeless tobacco: Never  Vaping Use   Vaping Use: Never used  Substance Use Topics   Alcohol use: Not Currently    Alcohol/week: 1.0 standard drink of alcohol    Types: 1 Shots of liquor per week   Drug use: No    Family History  Problem Relation Age of Onset   Heart failure Mother    Breast cancer Sister     PE: Vitals:   05/23/22 1636 05/23/22 2225 05/24/22 0603 05/24/22 0636  BP: 96/61 114/74 (!) 128/97   Pulse: 86 82 94 85  Resp: '16 18 18   '$ Temp:  98 F (  36.7 C) 98 F (36.7 C)   TempSrc:      SpO2: 97% 97% 91% 90%  Weight:      Height:       Patient appears to be in no acute distress  patient is alert and oriented x3, is wearing mittens for his agitation Atraumatic normocephalic head No cervical or supraclavicular lymphadenopathy appreciated No increased work of breathing, no audible wheezes/rhonchi Regular sinus rhythm/rate Abdomen is soft, nontender, nondistended, no CVA  Suprapubic fullness and tenderness to palpation Lower extremities are symmetric without appreciable edema Grossly neurologically intact No identifiable skin  lesions  Recent Labs    05/22/22 0349 05/23/22 0350 05/24/22 0328  WBC 13.4* 11.1* 7.8  HGB 10.4* 9.1* 9.8*  HCT 32.3* 28.9* 30.2*   Recent Labs    05/22/22 0349 05/23/22 0350 05/24/22 0328  NA 139 137 135  K 4.7 4.5 3.9  CL 109 106 104  CO2 '22 26 23  '$ GLUCOSE 143* 112* 118*  BUN 28* 28* 21  CREATININE 1.01 0.89 0.85  CALCIUM 8.3* 8.4* 8.3*   No results for input(s): "LABPT", "INR" in the last 72 hours. No results for input(s): "LABURIN" in the last 72 hours. Results for orders placed or performed during the hospital encounter of 05/20/22  Surgical pcr screen     Status: None   Collection Time: 05/21/22 12:07 PM   Specimen: Nasal Mucosa; Nasal Swab  Result Value Ref Range Status   MRSA, PCR NEGATIVE NEGATIVE Final   Staphylococcus aureus NEGATIVE NEGATIVE Final    Comment: (NOTE) The Xpert SA Assay (FDA approved for NASAL specimens in patients 3 years of age and older), is one component of a comprehensive surveillance program. It is not intended to diagnose infection nor to guide or monitor treatment. Performed at [facility], [place] [address] [place], [place] [address]    Procedure: Pre-procedure diagnosis: difficult foley catheterization Post-procedure diagnosis: as above  Procedure performed: placement of complicated foley  Surgeon: Dr. Ardis Hughs  Findings: obstructing prostate  Specimen: none  Drains: 92F coude tipped foley  Indications:  Patient unable to void on his own.  Nursing staff have been unsuccessful at placing catheter.  Procedure:  Gentials were prepped and draped in the routine sterile fashion.  10cc of 1% viscous lidocaine jelly was then injected into the patient's urethra.  A 90F coude tipped catheter was then gently passed in to the urethral.  Resistance was met at the prostate, but with gentle pressue the catheter slid through the prostate and into the bladder.  Clear yellow urine was returned.  Patient  tolerated the procedure well - no immediate issues.  Disposition: Start flomax, repeat voiding trial in 5-7 days.   Imaging: none  Imp: 87 year old with Parkinson's disease status post total hip after falling with some Parkinson's dementia and obstructing prostate, unable to void likely due to his enlarged prostate, his dementia, his pain, and immobility.  Recommendations: The Foley catheter was placed easily, there was no blood upon return.  I did get more than a liter of urine out of his bladder.  He will need the Foley catheter now for the next 5 to 7 days.  I would recommend that he be started on tamsulosin if he can tolerate it with his blood pressure and he does not get lightheaded.  Once he is more ambulatory he should have a voiding trial.  Will schedule him for follow-up in our office in the coming weeks.  If he still  has a catheter at that time he can take it out.  Thank you for involving me in this patient's care, Please page with any further questions or concerns. Ardis Hughs

## 2022-05-25 DIAGNOSIS — N401 Enlarged prostate with lower urinary tract symptoms: Secondary | ICD-10-CM | POA: Diagnosis not present

## 2022-05-25 DIAGNOSIS — S72001A Fracture of unspecified part of neck of right femur, initial encounter for closed fracture: Secondary | ICD-10-CM | POA: Diagnosis not present

## 2022-05-25 DIAGNOSIS — D649 Anemia, unspecified: Secondary | ICD-10-CM | POA: Diagnosis not present

## 2022-05-25 DIAGNOSIS — E782 Mixed hyperlipidemia: Secondary | ICD-10-CM | POA: Diagnosis not present

## 2022-05-25 LAB — PROTIME-INR
INR: 1.1 (ref 0.8–1.2)
Prothrombin Time: 14.1 seconds (ref 11.4–15.2)

## 2022-05-25 LAB — CBC
HCT: 28.7 % — ABNORMAL LOW (ref 39.0–52.0)
HCT: 26.2 % — ABNORMAL LOW (ref 39.0–52.0)
Hemoglobin: 9.3 g/dL — ABNORMAL LOW (ref 13.0–17.0)
Hemoglobin: 8.5 g/dL — ABNORMAL LOW (ref 13.0–17.0)
MCH: 31.2 pg (ref 26.0–34.0)
MCH: 31.4 pg (ref 26.0–34.0)
MCHC: 32.4 g/dL (ref 30.0–36.0)
MCHC: 32.4 g/dL (ref 30.0–36.0)
MCV: 96.3 fL (ref 80.0–100.0)
MCV: 96.7 fL (ref 80.0–100.0)
Platelets: 173 10*3/uL (ref 150–400)
Platelets: 167 10*3/uL (ref 150–400)
RBC: 2.98 MIL/uL — ABNORMAL LOW (ref 4.22–5.81)
RBC: 2.71 MIL/uL — ABNORMAL LOW (ref 4.22–5.81)
RDW: 14.1 % (ref 11.5–15.5)
RDW: 14.1 % (ref 11.5–15.5)
WBC: 8.1 10*3/uL (ref 4.0–10.5)
WBC: 8.3 10*3/uL (ref 4.0–10.5)
nRBC: 0 % (ref 0.0–0.2)
nRBC: 0 % (ref 0.0–0.2)

## 2022-05-25 LAB — CBC WITH DIFFERENTIAL/PLATELET
Abs Immature Granulocytes: 0.03 10*3/uL (ref 0.00–0.07)
Basophils Absolute: 0 10*3/uL (ref 0.0–0.1)
Basophils Relative: 1 %
Eosinophils Absolute: 0.6 10*3/uL — ABNORMAL HIGH (ref 0.0–0.5)
Eosinophils Relative: 8 %
HCT: 26.1 % — ABNORMAL LOW (ref 39.0–52.0)
Hemoglobin: 8.5 g/dL — ABNORMAL LOW (ref 13.0–17.0)
Immature Granulocytes: 0 %
Lymphocytes Relative: 11 %
Lymphs Abs: 0.8 10*3/uL (ref 0.7–4.0)
MCH: 31.1 pg (ref 26.0–34.0)
MCHC: 32.6 g/dL (ref 30.0–36.0)
MCV: 95.6 fL (ref 80.0–100.0)
Monocytes Absolute: 0.8 10*3/uL (ref 0.1–1.0)
Monocytes Relative: 11 %
Neutro Abs: 5 10*3/uL (ref 1.7–7.7)
Neutrophils Relative %: 69 %
Platelets: 163 10*3/uL (ref 150–400)
RBC: 2.73 MIL/uL — ABNORMAL LOW (ref 4.22–5.81)
RDW: 14.1 % (ref 11.5–15.5)
WBC: 7.2 10*3/uL (ref 4.0–10.5)
nRBC: 0 % (ref 0.0–0.2)

## 2022-05-25 LAB — COMPREHENSIVE METABOLIC PANEL
ALT: 5 U/L (ref 0–44)
AST: 25 U/L (ref 15–41)
Albumin: 2.9 g/dL — ABNORMAL LOW (ref 3.5–5.0)
Alkaline Phosphatase: 48 U/L (ref 38–126)
Anion gap: 7 (ref 5–15)
BUN: 21 mg/dL (ref 8–23)
CO2: 26 mmol/L (ref 22–32)
Calcium: 8.4 mg/dL — ABNORMAL LOW (ref 8.9–10.3)
Chloride: 105 mmol/L (ref 98–111)
Creatinine, Ser: 0.71 mg/dL (ref 0.61–1.24)
GFR, Estimated: >60 mL/min (ref >60)
Glucose, Bld: 116 mg/dL — ABNORMAL HIGH (ref 70–99)
Potassium: 3.8 mmol/L (ref 3.5–5.1)
Sodium: 138 mmol/L (ref 135–145)
Total Bilirubin: 0.6 mg/dL (ref 0.3–1.2)
Total Protein: 4.8 g/dL — ABNORMAL LOW (ref 6.5–8.1)

## 2022-05-25 LAB — MAGNESIUM: Magnesium: 2 mg/dL (ref 1.7–2.4)

## 2022-05-25 LAB — IRON AND TIBC
Iron: 18 ug/dL — ABNORMAL LOW (ref 45–182)
Saturation Ratios: 8 % — ABNORMAL LOW (ref 17.9–39.5)
TIBC: 239 ug/dL — ABNORMAL LOW (ref 250–450)
UIBC: 221 ug/dL

## 2022-05-25 LAB — TYPE AND SCREEN
ABO/RH(D): O POS
Antibody Screen: NEGATIVE

## 2022-05-25 LAB — APTT: aPTT: 33 seconds (ref 24–36)

## 2022-05-25 LAB — FOLATE: Folate: 11.7 ng/mL (ref >5.9)

## 2022-05-25 LAB — VITAMIN B12: Vitamin B-12: 477 pg/mL (ref 180–914)

## 2022-05-25 MED ORDER — PROSIGHT PO TABS
1.0000 | ORAL_TABLET | Freq: Every day | ORAL | Status: DC
  Administered 2022-05-25 – 2022-05-28 (×3): 1 via ORAL
  Filled 2022-05-25 (×4): qty 1

## 2022-05-25 MED ORDER — SODIUM CHLORIDE 0.9 % IV SOLN
INTRAVENOUS | Status: DC | PRN
  Administered 2022-05-26: 250 mL via INTRAVENOUS

## 2022-05-25 MED ORDER — FERROUS SULFATE 325 (65 FE) MG PO TABS
325.0000 mg | ORAL_TABLET | Freq: Every day | ORAL | Status: DC
  Administered 2022-05-27 – 2022-05-28 (×2): 325 mg via ORAL
  Filled 2022-05-25 (×3): qty 1

## 2022-05-25 MED ORDER — BOOST / RESOURCE BREEZE PO LIQD CUSTOM
1.0000 | Freq: Three times a day (TID) | ORAL | Status: DC
  Administered 2022-05-25 – 2022-05-28 (×8): 1 via ORAL

## 2022-05-25 MED ORDER — SODIUM CHLORIDE 0.9 % IV SOLN
250.0000 mg | Freq: Once | INTRAVENOUS | Status: AC
  Administered 2022-05-25: 250 mg via INTRAVENOUS
  Filled 2022-05-25: qty 20

## 2022-05-25 NOTE — Plan of Care (Signed)
  Problem: Pain Managment: Goal: General experience of comfort will improve Outcome: Progressing

## 2022-05-25 NOTE — Progress Notes (Signed)
PROGRESS NOTE   Lee Martinez  [id number]:[id number] DOB: [birth year] DOA: 05/20/2022 PCP: [name], FNP   Date of Service: the patient was seen and examined on 05/25/2022  Brief Narrative:  87 year old male with past medical history of Parkinson disease, abdominal aortic aneurysm, BPH, hyperlipidemia presenting to [facility] emergency department after suffering a mechanical fall down some stairs at home with right hip pain.  Upon evaluation in the emergency department patient was found to have a displaced and angulated subcapital right femoral neck fracture.  The hospitalist group was then called to assess the patient for admission to the hospital.  Dr. [name] surgery was consulted and patient underwent right total hip replacement on 1/9 without immediate complications.  Patient has been evaluated by physical therapy and skilled physical therapy services in a skilled nursing facility has been recommended.  Patient is additionally began to exhibit urinary retention requiring intermittent urinary catheterization and initiation of Flomax.  Patient required Foley catheter placement early in the morning on 1/12 due to severe urinary retention.   Assessment and Plan: * Closed displaced fracture of right femoral neck (HCC) Right femoral neck fracture after mechanical fall.   Patient is status post successful right hip arthroplasty performed by Dr. [name] 1/9.  Patient now receiving aspirin 81 mg twice daily for DVT prophylaxis  Patient on scheduled Tylenol twice daily for pain control Patient additionally receiving as needed opiate-based analgesics for breakthrough pain  Patient has been evaluated by physical therapy and it is recommended the patient would benefit from skilled physical therapy services at a skilled nursing facility. Awaiting skilled nursing facility placement Jehovah's Witness, does not want blood transfusions. Obtaining vitamin D level, still pending  BPH with urinary  obstruction Known history of BPH Required Foley catheter placement on 1/9 requiring due to urinary retention Foley was then removed on 1/10 post operatively however patient failed voiding trial ultimately resulting in reinsertion of Foley catheter morning of 1/12 with the assistance of Dr. [name] with urology, their assistance is appreciated. Continuing Flomax once daily, patient having lightheadedness and therefore will not titrate further for now.   Urology recommending another voiding trial beginning approximately 1/19.   Normocytic anemia Drop in hemoglobin throughout the hospitalization this seems to have stabilized postoperatively now at 8.5 Anemia workup seems to be consistent with anemia of chronic disease and iron deficiency anemia Patient is Jehovah's Witness and does not receive blood transfusions Administering ferric gluconate 250 mg x 1 today No clinical evidence of bleeding Monitoring hemoglobin and hematocrit  Mixed hyperlipidemia Resuming home regimen of statin therapy  Parkinson disease Continue home regimen of Sinemet  Lesion of pelvic bone Stable on x-rays during this hospitalization compared to 12/2021. Apparently this has been worked up in the distant past and it is felt not to be malignant.  Major depressive disorder Continuing home regimen of sertraline  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 present in Oct of 2023    Subjective:  Patient reports that his right hip pain is continuing to improve.   Physical Exam:  Vitals:   05/24/22 2235 05/25/22 0507 05/25/22 1421 05/25/22 2008  BP: (!) 104/54 100/64 (!) 87/59 110/71  Pulse: 90 84 96 100  Resp: '16 18 17 18  '$ Temp: 98.8 F (37.1 C) 97.8 F (36.6 C) 98.4 F (36.9 C) 98.2 F (36.8 C)  TempSrc: Oral Oral Oral   SpO2: 96%  96% 96%  Weight:      Height:  Constitutional: Left but arousable, oriented x 3, no associated distress.   Skin: no rashes, no lesions,  poor skin turgor noted. Eyes: Pupils are equally reactive to light.  No evidence of scleral icterus or conjunctival pallor.  ENMT: Moist mucous membranes noted.  Posterior pharynx clear of any exudate or lesions.   Respiratory: clear to auscultation bilaterally, no wheezing, no crackles. Normal respiratory effort. No accessory muscle use.  Cardiovascular: Regular rate and rhythm, no murmurs / rubs / gallops. No extremity edema. 2+ pedal pulses. No carotid bruits.  Abdomen: Abdomen is soft and nontender.  No evidence of intra-abdominal masses.  Positive bowel sounds noted in all quadrants.   Musculoskeletal: Some pain with passive and active range of motion of the right hip.  Poor muscle tone. GU: Foley catheter is now in place    Data Reviewed:  I have personally reviewed and interpreted labs, imaging.  Significant findings are   CBC: Recent Labs  Lab 05/20/22 1940 05/21/22 0349 05/23/22 0350 05/24/22 0328 05/25/22 0500 05/25/22 0955 05/25/22 1638  WBC 13.0*   < > 11.1* 7.8 7.2 8.1 8.3  NEUTROABS 10.9*  --   --   --  5.0  --   --   HGB 13.5   < > 9.1* 9.8* 8.5* 9.3* 8.5*  HCT 40.7   < > 28.9* 30.2* 26.1* 28.7* 26.2*  MCV 92.9   < > 99.0 95.6 95.6 96.3 96.7  PLT 199   < > 137* 158 163 173 167   < > = values in this interval not displayed.   Basic Metabolic Panel: Recent Labs  Lab 05/21/22 0349 05/22/22 0349 05/23/22 0350 05/24/22 0328 05/25/22 0500  NA 138 139 137 135 138  K 4.1 4.7 4.5 3.9 3.8  CL 106 109 106 104 105  CO2 '24 22 26 23 26  '$ GLUCOSE 134* 143* 112* 118* 116*  BUN 29* 28* 28* 21 21  CREATININE 1.01 1.01 0.89 0.85 0.71  CALCIUM 9.1 8.3* 8.4* 8.3* 8.4*  MG  --   --  2.0 1.8 2.0     Coagulation Profile: Recent Labs  Lab 05/20/22 1940 05/25/22 0955  INR 1.1 1.1       Code Status:  DNR.  Code status decision has been confirmed with: patient    Severity of Illness:  The appropriate patient status for this patient is INPATIENT. Inpatient  status is judged to be reasonable and necessary in order to provide the required intensity of service to ensure the patient's safety. The patient's presenting symptoms, physical exam findings, and initial radiographic and laboratory data in the context of their chronic comorbidities is felt to place them at high risk for further clinical deterioration. Furthermore, it is not anticipated that the patient will be medically stable for discharge from the hospital within 2 midnights of admission.   * I certify that at the point of admission it is my clinical judgment that the patient will require inpatient hospital care spanning beyond 2 midnights from the point of admission due to high intensity of service, high risk for further deterioration and high frequency of surveillance required.*  Time spent:  50 minutes  Author:  Vernelle Emerald MD  05/25/2022 8:21 PM

## 2022-05-25 NOTE — Assessment & Plan Note (Signed)
Hemoglobin stable today at 8.4. Anemia workup seems to be consistent with anemia of chronic disease and iron deficiency anemia Patient is Jehovah's Witness and does not receive blood transfusions Administered ferric gluconate 250 mg x 1 1/13 Additionally initiating ferrous gluconate p.o. daily starting 1/14. No clinical evidence of bleeding Monitoring hemoglobin and hematocrit

## 2022-05-25 NOTE — Plan of Care (Addendum)
  Problem: Education: Goal: Knowledge of General Education information will improve Description: Including pain rating scale, medication(s)/side effects and non-pharmacologic comfort measures Outcome: Progressing   Problem: Nutrition: Goal: Adequate nutrition will be maintained Outcome: Progressing   Problem: Pain Managment: Goal: General experience of comfort will improve Outcome: Progressing   Problem: Safety: Goal: Ability to remain free from injury will improve Outcome: Progressing   Problem: Education: Goal: Knowledge of the prescribed therapeutic regimen will improve Outcome: Progressing

## 2022-05-26 ENCOUNTER — Inpatient Hospital Stay (HOSPITAL_COMMUNITY): Payer: Medicare HMO

## 2022-05-26 DIAGNOSIS — G9341 Metabolic encephalopathy: Secondary | ICD-10-CM

## 2022-05-26 DIAGNOSIS — S72001A Fracture of unspecified part of neck of right femur, initial encounter for closed fracture: Secondary | ICD-10-CM | POA: Diagnosis not present

## 2022-05-26 DIAGNOSIS — E43 Unspecified severe protein-calorie malnutrition: Secondary | ICD-10-CM | POA: Diagnosis present

## 2022-05-26 DIAGNOSIS — J189 Pneumonia, unspecified organism: Secondary | ICD-10-CM | POA: Diagnosis not present

## 2022-05-26 DIAGNOSIS — N401 Enlarged prostate with lower urinary tract symptoms: Secondary | ICD-10-CM | POA: Diagnosis not present

## 2022-05-26 LAB — CBC
HCT: 25.9 % — ABNORMAL LOW (ref 39.0–52.0)
Hemoglobin: 8.4 g/dL — ABNORMAL LOW (ref 13.0–17.0)
MCH: 31.3 pg (ref 26.0–34.0)
MCHC: 32.4 g/dL (ref 30.0–36.0)
MCV: 96.6 fL (ref 80.0–100.0)
Platelets: 178 10*3/uL (ref 150–400)
RBC: 2.68 MIL/uL — ABNORMAL LOW (ref 4.22–5.81)
RDW: 14 % (ref 11.5–15.5)
WBC: 8.7 10*3/uL (ref 4.0–10.5)
nRBC: 0 % (ref 0.0–0.2)

## 2022-05-26 LAB — URINALYSIS, ROUTINE W REFLEX MICROSCOPIC
Bilirubin Urine: NEGATIVE
Glucose, UA: NEGATIVE mg/dL
Hgb urine dipstick: NEGATIVE
Ketones, ur: 5 mg/dL — AB
Nitrite: NEGATIVE
Protein, ur: NEGATIVE mg/dL
Specific Gravity, Urine: 1.018 (ref 1.005–1.030)
pH: 6 (ref 5.0–8.0)

## 2022-05-26 LAB — GLUCOSE, CAPILLARY: Glucose-Capillary: 103 mg/dL — ABNORMAL HIGH (ref 70–99)

## 2022-05-26 MED ORDER — CHLORHEXIDINE GLUCONATE CLOTH 2 % EX PADS
6.0000 | MEDICATED_PAD | Freq: Every day | CUTANEOUS | Status: DC
  Administered 2022-05-27 – 2022-05-28 (×2): 6 via TOPICAL

## 2022-05-26 MED ORDER — SODIUM CHLORIDE 0.9 % IV SOLN
500.0000 mg | INTRAVENOUS | Status: DC
  Administered 2022-05-27 (×2): 500 mg via INTRAVENOUS
  Filled 2022-05-26 (×2): qty 5

## 2022-05-26 MED ORDER — SODIUM CHLORIDE 0.9 % IV SOLN
2.0000 g | INTRAVENOUS | Status: DC
  Administered 2022-05-26 – 2022-05-28 (×2): 2 g via INTRAVENOUS
  Filled 2022-05-26 (×2): qty 20

## 2022-05-26 NOTE — Assessment & Plan Note (Signed)
Prolonged extremely poor oral intake Poor muscle tone on exam with evidence of temporal wasting all concerning for severe protein calorie malnutrition Nutrition has been consulted their input is appreciated Attempting to provide patient with nutritional supplements, multivitamins

## 2022-05-26 NOTE — Progress Notes (Addendum)
PROGRESS NOTE   Lee Martinez  [id number]:[id number] DOB: 09/11/1935 DOA: 05/20/2022 PCP: [name], FNP   Date of Service: the patient was seen and examined on 05/26/2022  Brief Narrative:  87 year old male with past medical history of Parkinson disease, abdominal aortic aneurysm, BPH, hyperlipidemia presenting to [facility] emergency department after suffering a mechanical fall down some stairs at home with right hip pain.  Upon evaluation in the emergency department patient was found to have a displaced and angulated subcapital right femoral neck fracture.  The hospitalist group was then called to assess the patient for admission to the hospital.  Dr. [name] surgery was consulted and patient underwent right total hip replacement on 1/9 without immediate complications.  Patient has been evaluated by physical therapy and skilled physical therapy services in a skilled nursing facility has been recommended.  Patient is additionally began to exhibit urinary retention requiring intermittent urinary catheterization and initiation of Flomax.  Patient required Foley catheter placement early in the morning on 1/12 due to severe urinary retention.   Assessment and Plan: * Closed displaced fracture of right femoral neck (HCC) Right femoral neck fracture after mechanical fall.   Patient is status post successful right hip arthroplasty performed by Dr. [name] 1/9.  Patient now receiving aspirin 81 mg twice daily for DVT prophylaxis  Patient on scheduled Tylenol twice daily for pain control Patient additionally receiving as needed opiate-based analgesics for breakthrough pain  Patient has been evaluated by physical therapy and it is recommended the patient would benefit from skilled physical therapy services at a skilled nursing facility. Awaiting skilled nursing facility placement Jehovah's Witness, does not want blood transfusions. Obtaining vitamin D level, still pending  Pneumonia of left  lower lobe due to infectious organism After patient exhibited notable lethargy this morning, urinalysis and chest x-ray have been ordered. Urinalysis is still pending but chest x-ray is revealing a new left lower lobe infiltrate concerning for developing pneumonia Initiating intravenous ceftriaxone and azithromycin Obtaining blood cultures Supplemental oxygen as necessary for bouts of hypoxia  Acute metabolic encephalopathy Likely secondary to developing pneumonia Treating underlying illness and monitoring for improvement.  BPH with urinary obstruction Known history of BPH Required Foley catheter placement on 1/9 requiring due to urinary retention This was then removed on 1/10 post operatively however patient failed voiding trial ultimately resulting in reinsertion of Foley catheter morning of 1/12 with the assistance of Dr. [name] with urology, their assistance is appreciated. Continuing Flomax once daily, patient having lightheadedness and therefore will not titrate further for now.   Urology recommending another voiding trial beginning approximately 1/19.  If patient is already at his skilled nursing facility at that time they can attempt a voiding trial there.   Normocytic anemia Hemoglobin stable today at 8.4. Anemia workup seems to be consistent with anemia of chronic disease and iron deficiency anemia Patient is Jehovah's Witness and does not receive blood transfusions Administered ferric gluconate 250 mg x 1 1/13 Additionally initiating ferrous gluconate p.o. daily starting 1/14. No clinical evidence of bleeding Monitoring hemoglobin and hematocrit  Mixed hyperlipidemia Resuming home regimen of statin therapy  Parkinson disease Continue home regimen of Sinemet  Lesion of pelvic bone Stable on x-rays during this hospitalization compared to 12/2021. Apparently this has been worked up in the distant past and it is felt not to be malignant.  Major depressive  disorder Continuing home regimen of sertraline  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 Son reports that patient injured  his shoulder at least 4 months ago and was evaluated in the outpatient setting conservative nonoperative management was recommended at that time. present on imaging and Oct of 2023  Protein-calorie malnutrition, severe Prolonged extremely poor oral intake Poor muscle tone on exam with evidence of temporal wasting all concerning for severe protein calorie malnutrition Nutrition has been consulted their input is appreciated Attempting to provide patient with nutritional supplements, multivitamins    Subjective:  Patient is notably lethargic and minimally verbal.  Patient denies pain or shortness of breath.  Physical Exam:  Vitals:   05/25/22 2008 05/25/22 2226 05/26/22 0505 05/26/22 2141  BP: 110/71 117/68 105/64 97/69  Pulse: 100 96 81 95  Resp: '18  16 16  '$ Temp: 98.2 F (36.8 C) 98.1 F (36.7 C) 97.7 F (36.5 C) 99.1 F (37.3 C)  TempSrc:   Oral Oral  SpO2: 96% 96% 92% 96%  Weight:      Height:        Constitutional: Left but arousable, minimally verbal no associated distress.   Skin: no rashes, no lesions, poor skin turgor noted. Eyes: Pupils are equally reactive to light.  No evidence of scleral icterus or conjunctival pallor.  ENMT: Moist mucous membranes noted.  Posterior pharynx clear of any exudate or lesions.   Respiratory: Notable by the rales without wheezing.  Normal respiratory effort. No accessory muscle use.  Cardiovascular: Regular rate and rhythm, no murmurs / rubs / gallops. No extremity edema. 2+ pedal pulses. No carotid bruits.  Abdomen: Abdomen is soft and nontender.  No evidence of intra-abdominal masses.  Positive bowel sounds noted in all quadrants.   Musculoskeletal: Some pain with passive and active range of motion of the right hip.  Poor muscle tone. GU: Foley catheter is now in place    Data  Reviewed:  I have personally reviewed and interpreted labs, imaging.  Significant findings are   CBC: Recent Labs  Lab 05/20/22 1940 05/21/22 0349 05/24/22 0328 05/25/22 0500 05/25/22 0955 05/25/22 1638 05/26/22 0348  WBC 13.0*   < > 7.8 7.2 8.1 8.3 8.7  NEUTROABS 10.9*  --   --  5.0  --   --   --   HGB 13.5   < > 9.8* 8.5* 9.3* 8.5* 8.4*  HCT 40.7   < > 30.2* 26.1* 28.7* 26.2* 25.9*  MCV 92.9   < > 95.6 95.6 96.3 96.7 96.6  PLT 199   < > 158 163 173 167 178   < > = values in this interval not displayed.   Basic Metabolic Panel: Recent Labs  Lab 05/21/22 0349 05/22/22 0349 05/23/22 0350 05/24/22 0328 05/25/22 0500  NA 138 139 137 135 138  K 4.1 4.7 4.5 3.9 3.8  CL 106 109 106 104 105  CO2 '24 22 26 23 26  '$ GLUCOSE 134* 143* 112* 118* 116*  BUN 29* 28* 28* 21 21  CREATININE 1.01 1.01 0.89 0.85 0.71  CALCIUM 9.1 8.3* 8.4* 8.3* 8.4*  MG  --   --  2.0 1.8 2.0     Coagulation Profile: Recent Labs  Lab 05/20/22 1940 05/25/22 0955  INR 1.1 1.1    Code Status:  DNR.   Family Communication: Plan of care discussed with son via phone conversation.    Severity of Illness:  The appropriate patient status for this patient is INPATIENT. Inpatient status is judged to be reasonable and necessary in order to provide the required intensity of service to ensure the patient's safety. The  patient's presenting symptoms, physical exam findings, and initial radiographic and laboratory data in the context of their chronic comorbidities is felt to place them at high risk for further clinical deterioration. Furthermore, it is not anticipated that the patient will be medically stable for discharge from the hospital within 2 midnights of admission.   * I certify that at the point of admission it is my clinical judgment that the patient will require inpatient hospital care spanning beyond 2 midnights from the point of admission due to high intensity of service, high risk for further  deterioration and high frequency of surveillance required.*  Time spent:  54 minutes  Author:  Vernelle Emerald MD  05/26/2022 9:58 PM

## 2022-05-26 NOTE — Plan of Care (Signed)
  Problem: Pain Managment: Goal: General experience of comfort will improve Outcome: Progressing

## 2022-05-26 NOTE — Assessment & Plan Note (Signed)
Likely secondary to developing pneumonia Treating underlying illness and monitoring for improvement.

## 2022-05-26 NOTE — Assessment & Plan Note (Addendum)
chest x-ray performed 1/14  revealing a new left lower lobe infiltrate concerning for developing pneumonia Continuing intravenous ceftriaxone and azithromycin Following blood cultures Supplemental oxygen as necessary for bouts of hypoxia

## 2022-05-27 DIAGNOSIS — J189 Pneumonia, unspecified organism: Secondary | ICD-10-CM | POA: Diagnosis not present

## 2022-05-27 DIAGNOSIS — S72001A Fracture of unspecified part of neck of right femur, initial encounter for closed fracture: Secondary | ICD-10-CM | POA: Diagnosis not present

## 2022-05-27 DIAGNOSIS — G9341 Metabolic encephalopathy: Secondary | ICD-10-CM | POA: Diagnosis not present

## 2022-05-27 DIAGNOSIS — N401 Enlarged prostate with lower urinary tract symptoms: Secondary | ICD-10-CM | POA: Diagnosis not present

## 2022-05-27 LAB — CBC WITH DIFFERENTIAL/PLATELET
Abs Immature Granulocytes: 0.08 10*3/uL — ABNORMAL HIGH (ref 0.00–0.07)
Basophils Absolute: 0.1 10*3/uL (ref 0.0–0.1)
Basophils Relative: 1 %
Eosinophils Absolute: 0.6 10*3/uL — ABNORMAL HIGH (ref 0.0–0.5)
Eosinophils Relative: 6 %
HCT: 26.6 % — ABNORMAL LOW (ref 39.0–52.0)
Hemoglobin: 8.5 g/dL — ABNORMAL LOW (ref 13.0–17.0)
Immature Granulocytes: 1 %
Lymphocytes Relative: 12 %
Lymphs Abs: 1.2 10*3/uL (ref 0.7–4.0)
MCH: 31 pg (ref 26.0–34.0)
MCHC: 32 g/dL (ref 30.0–36.0)
MCV: 97.1 fL (ref 80.0–100.0)
Monocytes Absolute: 1.2 10*3/uL — ABNORMAL HIGH (ref 0.1–1.0)
Monocytes Relative: 13 %
Neutro Abs: 6.4 10*3/uL (ref 1.7–7.7)
Neutrophils Relative %: 67 %
Platelets: 217 10*3/uL (ref 150–400)
RBC: 2.74 MIL/uL — ABNORMAL LOW (ref 4.22–5.81)
RDW: 14.2 % (ref 11.5–15.5)
WBC: 9.5 10*3/uL (ref 4.0–10.5)
nRBC: 0 % (ref 0.0–0.2)

## 2022-05-27 LAB — COMPREHENSIVE METABOLIC PANEL
ALT: 6 U/L (ref 0–44)
AST: 26 U/L (ref 15–41)
Albumin: 2.6 g/dL — ABNORMAL LOW (ref 3.5–5.0)
Alkaline Phosphatase: 57 U/L (ref 38–126)
Anion gap: 8 (ref 5–15)
BUN: 25 mg/dL — ABNORMAL HIGH (ref 8–23)
CO2: 25 mmol/L (ref 22–32)
Calcium: 8.2 mg/dL — ABNORMAL LOW (ref 8.9–10.3)
Chloride: 106 mmol/L (ref 98–111)
Creatinine, Ser: 0.83 mg/dL (ref 0.61–1.24)
GFR, Estimated: >60 mL/min (ref >60)
Glucose, Bld: 98 mg/dL (ref 70–99)
Potassium: 4.2 mmol/L (ref 3.5–5.1)
Sodium: 139 mmol/L (ref 135–145)
Total Bilirubin: 0.4 mg/dL (ref 0.3–1.2)
Total Protein: 5.6 g/dL — ABNORMAL LOW (ref 6.5–8.1)

## 2022-05-27 LAB — VITAMIN D 25 HYDROXY (VIT D DEFICIENCY, FRACTURES)

## 2022-05-27 LAB — PHOSPHORUS: Phosphorus: 2.7 mg/dL (ref 2.5–4.6)

## 2022-05-27 LAB — MAGNESIUM: Magnesium: 2 mg/dL (ref 1.7–2.4)

## 2022-05-27 LAB — PROCALCITONIN: Procalcitonin: <0.1 ng/mL

## 2022-05-27 MED ORDER — SODIUM CHLORIDE 0.9 % IV SOLN
250.0000 mg | Freq: Once | INTRAVENOUS | Status: AC
  Administered 2022-05-28: 250 mg via INTRAVENOUS
  Filled 2022-05-27 (×2): qty 20

## 2022-05-27 NOTE — Progress Notes (Signed)
PROGRESS NOTE   Lee Martinez  [name]:[id number] DOB: 12-Jul-1935 DOA: 05/20/2022 PCP: [name], FNP   Date of Service: the patient was seen and examined on 05/27/2022  Brief Narrative:  87 year old male with past medical history of Parkinson disease, abdominal aortic aneurysm, BPH, hyperlipidemia presenting to [facility] long hospital emergency department after suffering a mechanical fall down some stairs at home with right hip pain.  Upon evaluation in the emergency department patient was found to have a displaced and angulated subcapital right femoral neck fracture.  The hospitalist group was then called to assess the patient for admission to the hospital.  Dr. [name] surgery was consulted and patient underwent right total hip replacement on 1/9 without immediate complications.  Patient has been evaluated by physical therapy and skilled physical therapy services in a skilled nursing facility has been recommended.  Patient is additionally began to exhibit urinary retention requiring intermittent urinary catheterization and initiation of Flomax.  Patient required Foley catheter placement early in the morning on 1/12 due to severe urinary retention.  Urology was consulted.  They recommended repeat voiding trial approximately 1/19 followed by outpatient urology follow-up.  Hospital course also complicated by significant anemia which was felt to be secondary to a combination of anemia of chronic disease and iron deficiency anemia.  Patient was managed with a combination of intravenous iron infusions and initiation of oral iron supplementation.    Hospital course additionally complicated by development of left lower lobe pneumonia with associated encephalopathy.  Patient was initiated on intravenous ceftriaxone and azithromycin for antibiotic coverage with subsequent resolution in encephalopathy and improvement in pneumonia.  Patient will be discharged to the skilled nursing facility on oral antibiotic  therapy     Assessment and Plan: * Closed displaced fracture of right femoral neck (HCC) Right femoral neck fracture after mechanical fall.   Patient is status post successful right hip arthroplasty performed by Dr. [name] 1/9.  Patient now receiving aspirin 81 mg twice daily for DVT prophylaxis  Patient on scheduled Tylenol twice daily for pain control Patient additionally receiving as needed opiate-based analgesics for breakthrough pain  Patient has been evaluated by physical therapy and it is recommended the patient would benefit from skilled physical therapy services at a skilled nursing facility. Awaiting skilled nursing facility placement Jehovah's Witness, does not want blood transfusions. Vitamin D level 30.5.  Pneumonia of left lower lobe due to infectious organism chest x-ray performed 1/14  revealing a new left lower lobe infiltrate concerning for developing pneumonia Continuing intravenous ceftriaxone and azithromycin Following blood cultures Supplemental oxygen as necessary for bouts of hypoxia  Acute metabolic encephalopathy Likely secondary to developing pneumonia Substantially improved compared to yesterday since initiation of antibiotics Monitoring for continued improvement  BPH with urinary obstruction Known history of BPH Required Foley catheter placement on 1/9 requiring due to urinary retention This was then removed on 1/10 post operatively however patient failed voiding trial ultimately resulting in reinsertion of Foley catheter morning of 1/12 with the assistance of Dr. [name] with urology, their assistance is appreciated. Continuing Flomax once daily, patient having lightheadedness and therefore will not titrate to twice daily for now.   Urology recommending another voiding trial beginning approximately 1/19.  If patient is already at his skilled nursing facility at that time they can attempt a voiding trial there.   Normocytic anemia Hemoglobin continues  to be stable Anemia workup seems to be consistent with anemia of chronic disease and iron deficiency anemia Patient is Jehovah's Witness and  does not receive blood transfusions Administered ferric gluconate 250 mg x 1 1/13 will administer 1 additional dose Oral ferrous gluconate afterwards No clinical evidence of bleeding Monitoring hemoglobin and hematocrit  Mixed hyperlipidemia Resuming home regimen of statin therapy  Parkinson disease Continue home regimen of Sinemet  Lesion of pelvic bone Stable on x-rays during this hospitalization compared to 12/2021. Apparently this has been worked up in the distant past and it is felt not to be malignant.  Major depressive disorder Continuing home regimen of sertraline  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 Grandson reports that patient injured his shoulder at least 4 months ago and was evaluated in the outpatient setting conservative nonoperative management was recommended at that time. present on imaging and Oct of 2023  Protein-calorie malnutrition, severe Prolonged extremely poor oral intake Poor muscle tone on exam with evidence of temporal wasting all concerning for severe protein calorie malnutrition Nutrition has been consulted their input is appreciated Attempting to provide patient with nutritional supplements, multivitamins    Subjective:  Patient states that he is feeling much better than yesterday.  Patient denies shortness of breath or cough.  Patient states that his right hip pain has mostly resolved.  Physical Exam:  Vitals:   05/26/22 0505 05/26/22 2141 05/27/22 0530 05/27/22 1334  BP: 105/64 97/69 118/65 (!) 92/58  Pulse: 81 95 85 93  Resp: '16 16 16 18  '$ Temp: 97.7 F (36.5 C) 99.1 F (37.3 C) (!) 97.3 F (36.3 C) 98.2 F (36.8 C)  TempSrc: Oral Oral Oral Oral  SpO2: 92% 96% 91% 94%  Weight:      Height:        Constitutional: Lethargic but arousable and oriented x3, no  associated distress.  Notable temporal wasting. Skin: Surgical wound of the right hip without dehiscence, redness or drainage.  No rashes, no lesions, good skin turgor noted. Eyes: Pupils are equally reactive to light.  No evidence of scleral icterus or conjunctival pallor.  ENMT: Moist mucous membranes noted.  Posterior pharynx clear of any exudate or lesions.   Respiratory: clear to auscultation bilaterally, no wheezing, no crackles. Normal respiratory effort. No accessory muscle use.  Cardiovascular: Regular rate and rhythm, no murmurs / rubs / gallops. No extremity edema. 2+ pedal pulses. No carotid bruits.  Abdomen: Abdomen is soft and nontender.  No evidence of intra-abdominal masses.  Positive bowel sounds noted in all quadrants.   Musculoskeletal: Some pain with passive and active range of motion of the right hip.. Good ROM, no contractures.  Poor tone.   Data Reviewed:  I have personally reviewed and interpreted labs, imaging.  Significant findings are   CBC: Recent Labs  Lab 05/20/22 1940 05/21/22 0349 05/25/22 0500 05/25/22 0955 05/25/22 1638 05/26/22 0348 05/27/22 0335  WBC 13.0*   < > 7.2 8.1 8.3 8.7 9.5  NEUTROABS 10.9*  --  5.0  --   --   --  6.4  HGB 13.5   < > 8.5* 9.3* 8.5* 8.4* 8.5*  HCT 40.7   < > 26.1* 28.7* 26.2* 25.9* 26.6*  MCV 92.9   < > 95.6 96.3 96.7 96.6 97.1  PLT 199   < > 163 173 167 178 217   < > = values in this interval not displayed.   Basic Metabolic Panel: Recent Labs  Lab 05/22/22 0349 05/23/22 0350 05/24/22 0328 05/25/22 0500 05/27/22 0335  NA 139 137 135 138 139  K 4.7 4.5 3.9 3.8 4.2  CL  109 106 104 105 106  CO2 '22 26 23 26 25  '$ GLUCOSE 143* 112* 118* 116* 98  BUN 28* 28* 21 21 25*  CREATININE 1.01 0.89 0.85 0.71 0.83  CALCIUM 8.3* 8.4* 8.3* 8.4* 8.2*  MG  --  2.0 1.8 2.0 2.0  PHOS  --   --   --   --  2.7   GFR: Estimated Creatinine Clearance: 58.1 mL/min (by C-G formula based on SCr of 0.83 mg/dL). Liver Function  Tests: Recent Labs  Lab 05/25/22 0500 05/27/22 0335  AST 25 26  ALT 5 6  ALKPHOS 48 57  BILITOT 0.6 0.4  PROT 4.8* 5.6*  ALBUMIN 2.9* 2.6*    Coagulation Profile: Recent Labs  Lab 05/20/22 1940 05/25/22 0955  INR 1.1 1.1     Code Status:  DNR.  Code status decision has been confirmed with: patient Family Communication: Grandson updated on plan of care via phone conversation on 1/14.   Severity of Illness:  The appropriate patient status for this patient is INPATIENT. Inpatient status is judged to be reasonable and necessary in order to provide the required intensity of service to ensure the patient's safety. The patient's presenting symptoms, physical exam findings, and initial radiographic and laboratory data in the context of their chronic comorbidities is felt to place them at high risk for further clinical deterioration. Furthermore, it is not anticipated that the patient will be medically stable for discharge from the hospital within 2 midnights of admission.   * I certify that at the point of admission it is my clinical judgment that the patient will require inpatient hospital care spanning beyond 2 midnights from the point of admission due to high intensity of service, high risk for further deterioration and high frequency of surveillance required.*  Time spent:  38 minutes  Author:  Vernelle Emerald MD  05/27/2022 7:17 PM

## 2022-05-27 NOTE — Care Management Important Message (Signed)
Important Message  Patient Details IM Letter given. Name: SOLAN VOSLER MRN: [id number] Date of Birth: 11/09/1935   Medicare Important Message Given:  Yes     Kerin Salen 05/27/2022, 2:35 PM

## 2022-05-27 NOTE — Progress Notes (Signed)
Nutrition Follow-up  DOCUMENTATION CODES:   Severe malnutrition in context of chronic illness  INTERVENTION:  - Continue Regular diet. - Continue Boost Plus po TID, each supplement provides 250 kcal and 9 grams of protein - Continue Boost Breeze po TID, each supplement provides 250 kcal and 9 grams of protein - Encourage intake of supplements.  - Multivitamin to support micronutrient needs for healing. - Monitor weight trends.   NUTRITION DIAGNOSIS:   Severe Malnutrition related to chronic illness (Parkinson's disease) as evidenced by severe fat depletion, severe muscle depletion. *ongoing  GOAL:   Patient will meet greater than or equal to 90% of their needs *unmet, supplements ordered  MONITOR:   PO intake, Supplement acceptance, Weight trends, Diet advancement  REASON FOR ASSESSMENT:   Consult Assessment of nutrition requirement/status, Poor PO  ASSESSMENT:   87 y.o. male with medical history significant of Parkinson's dz who tripped and fell down a couple of stairs with resulting fracture of R femoral neck and fracture of R acromial process.  Patient reports he is eating 3 meals a day but admits he isn't able to eat a lot of the meals. He is documented to only be consuming an average of 24% of meals over the past 4 days.  Thankfully, he reports enjoying both Boost Plus and [employer] and drinking them. Per [facility], he is drinking at least 2 of each supplement daily. If patient drinks all 6, this will provide 1830 kcals and 84g of protein, which will meet 89% of calorie needs and 93% of protein needs.  Stressed importance of trying to drink all supplements daily to aid in meeting nutrition goals and avoid weight loss during admission. Patient endorsed understanding.    Medications reviewed and include: Sinemet, Colace, Miralaz, Iron, MVI  Labs reviewed:  -   Diet Order:   Diet Order             Diet regular Room service appropriate? Yes; Fluid consistency: Thin   Diet effective now                   EDUCATION NEEDS:  Education needs have been addressed  Skin:  Skin Assessment: Skin Integrity Issues: Skin Integrity Issues:: Incisions Incisions: R hip  Last BM:  1/12  Height:  Ht Readings from Last 1 Encounters:  05/20/22 6' (1.829 m)   Weight:  Wt Readings from Last 1 Encounters:  05/20/22 62 kg   Ideal Body Weight:  80.91 kg  BMI:  Body mass index is 18.54 kg/m.  Estimated Nutritional Needs:  Kcal:  2050-2250 kcals Protein:  90-105 grams Fluid:  >/= 2L    Lee Martinez RD, LDN For contact information, refer to [facility].

## 2022-05-27 NOTE — Plan of Care (Signed)

## 2022-05-27 NOTE — Progress Notes (Signed)
Physical Therapy Treatment Patient Details Name: Lee Martinez MRN: [id number] DOB: 1936/03/09 Today's Date: 05/27/2022   History of Present Illness 87 y.o. male adm after fall resulting in femoral neck fx. pt is s/p R AA THA on 05/21/21. PMH: Parkinson's disease,  dementia.  It is otherwise noted that he is Jehovah witness.    PT Comments    Pt is agreeable to mobility, improving pt effort and less pain overall with mobility; pt c/o dizziness in standing, unable to get standing BP; BP 100/70 after transfer to recliner. Continue PT in acute setting. Will benefit from SNF    Recommendations for follow up therapy are one component of a multi-disciplinary discharge planning process, led by the attending physician.  Recommendations may be updated based on patient status, additional functional criteria and insurance authorization.  Follow Up Recommendations  Skilled nursing-short term rehab (<3 hours/day) Can patient physically be transported by private vehicle: No   Assistance Recommended at Discharge Frequent or constant Supervision/Assistance  Patient can return home with the following A lot of help with walking and/or transfers;A lot of help with bathing/dressing/bathroom;Assist for transportation;Assistance with cooking/housework;Help with stairs or ramp for entrance;Direct supervision/assist for medications management;Direct supervision/assist for financial management   Equipment Recommendations  None recommended by PT    Recommendations for Other Services       Precautions / Restrictions Precautions Precautions: Fall Precaution Comments: dizzy with standing, unable to stand for orthostatics Restrictions Weight Bearing Restrictions: No RLE Weight Bearing: Weight bearing as tolerated     Mobility  Bed Mobility Overal bed mobility: Needs Assistance Bed Mobility: Supine to Sit     Supine to sit: Mod assist, +2 for physical assistance, +2 for safety/equipment     General bed  mobility comments: Assist to manage LEs, control trunk and complete rotation to/from EOB using pad; pt able to self assist scootin to EOB with min/guard for safety    Transfers Overall transfer level: Needs assistance Equipment used: Rolling walker (2 wheels) Transfers: Sit to/from Stand, Bed to chair/wheelchair/BSC Sit to Stand: Min assist, +2 physical assistance, +2 safety/equipment   Step pivot transfers: Min assist, +2 safety/equipment       General transfer comment: cues for LE management and use of UEs to self assist; pt dizzy instanding, unabel to stand for BP; BP once seated 100/70    Ambulation/Gait                   Stairs             Wheelchair Mobility    Modified Rankin (Stroke Patients Only)       Balance Overall balance assessment: Needs assistance Sitting-balance support: No upper extremity supported, Feet supported Sitting balance-Leahy Scale: Fair     Standing balance support: Reliant on assistive device for balance, During functional activity, Bilateral upper extremity supported Standing balance-Leahy Scale: Poor Standing balance comment: reliant on RW and external assist                            Cognition Arousal/Alertness: Awake/alert Behavior During Therapy: Flat affect Overall Cognitive Status: History of cognitive impairments - at baseline                                 General Comments: Says "no" to many requests but with encouragement will participate        Exercises Total  Joint Exercises Ankle Circles/Pumps: AROM, Both, 5 reps    General Comments        Pertinent Vitals/Pain Pain Assessment Pain Assessment: Faces Faces Pain Scale: Hurts a little bit Pain Location: right hip Pain Descriptors / Indicators: Grimacing Pain Intervention(s): Limited activity within patient's tolerance, Monitored during session, Repositioned    Home Living                          Prior  Function            PT Goals (current goals can now be found in the care plan section) Acute Rehab PT Goals PT Goal Formulation: Patient unable to participate in goal setting Time For Goal Achievement: 06/05/22 Potential to Achieve Goals: Good Progress towards PT goals: Not progressing toward goals - comment (low BP, dizziness with standing)    Frequency    Min 3X/week      PT Plan Current plan remains appropriate    Co-evaluation              AM-PAC PT "6 Clicks" Mobility   Outcome Measure  Help needed turning from your back to your side while in a flat bed without using bedrails?: A Lot Help needed moving from lying on your back to sitting on the side of a flat bed without using bedrails?: A Lot Help needed moving to and from a bed to a chair (including a wheelchair)?: A Lot Help needed standing up from a chair using your arms (e.g., wheelchair or bedside chair)?: A Lot Help needed to walk in hospital room?: Total Help needed climbing 3-5 steps with a railing? : Total 6 Click Score: 10    End of Session Equipment Utilized During Treatment: Gait belt Activity Tolerance: Other (comment) (dizziness, low BP) Patient left: in chair;with call bell/phone within reach;with chair alarm set   PT Visit Diagnosis: Other abnormalities of gait and mobility (R26.89);Difficulty in walking, not elsewhere classified (R26.2)     Time: 3557-3220 PT Time Calculation (min) (ACUTE ONLY): 14 min  Charges:  $Therapeutic Activity: 8-22 mins                     Baxter Flattery, PT  Acute Rehab Dept [facility]) [phone]  [facility] Weekend Pager [facility] only)  [phone]  05/27/2022    [facility] 05/27/2022, 11:26 AM

## 2022-05-27 NOTE — TOC Progression Note (Signed)
Transition of Care [facility]) - Progression Note   Patient Details  Name: Lee Martinez MRN: [id number] Date of Birth: 03/17/1936  Transition of Care [facility]) CM/SW [place], LCSW Phone Number: 05/27/2022, 4:08 PM  Clinical Narrative: CSW confirmed with Lexine Baton in admissions at [facility] that a bed should be available tomorrow. TOC to complete insurance authorization when bed is confirmed.  Expected Discharge Plan: [place] Barriers to Discharge: Continued Medical Work up, Ship broker, SNF Pending bed offer  Expected Discharge Plan and Services In-house Referral: Clinical Social Work Post Acute Care Choice: [place] Living arrangements for the past 2 months: Apartment           DME Arranged: N/A DME Agency: NA  Social Determinants of Health (SDOH) Interventions SDOH Screenings   Food Insecurity: No Food Insecurity (05/20/2022)  Housing: Low Risk  (05/20/2022)  Transportation Needs: No Transportation Needs (05/20/2022)  Utilities: Not At Risk (05/20/2022)  Depression (PHQ2-9): Medium Risk (02/07/2022)  Financial Resource Strain: Low Risk  (02/07/2022)  Physical Activity: Inactive (02/07/2022)  Stress: No Stress Concern Present (02/07/2022)  Tobacco Use: Low Risk  (05/22/2022)   Readmission Risk Interventions     No data to display

## 2022-05-28 DIAGNOSIS — R1312 Dysphagia, oropharyngeal phase: Secondary | ICD-10-CM | POA: Diagnosis not present

## 2022-05-28 DIAGNOSIS — M899 Disorder of bone, unspecified: Secondary | ICD-10-CM | POA: Diagnosis not present

## 2022-05-28 DIAGNOSIS — R2681 Unsteadiness on feet: Secondary | ICD-10-CM | POA: Diagnosis not present

## 2022-05-28 DIAGNOSIS — L821 Other seborrheic keratosis: Secondary | ICD-10-CM | POA: Diagnosis not present

## 2022-05-28 DIAGNOSIS — R339 Retention of urine, unspecified: Secondary | ICD-10-CM | POA: Diagnosis not present

## 2022-05-28 DIAGNOSIS — R2689 Other abnormalities of gait and mobility: Secondary | ICD-10-CM | POA: Diagnosis not present

## 2022-05-28 DIAGNOSIS — M6281 Muscle weakness (generalized): Secondary | ICD-10-CM | POA: Diagnosis not present

## 2022-05-28 DIAGNOSIS — N401 Enlarged prostate with lower urinary tract symptoms: Secondary | ICD-10-CM | POA: Diagnosis not present

## 2022-05-28 DIAGNOSIS — D649 Anemia, unspecified: Secondary | ICD-10-CM | POA: Diagnosis not present

## 2022-05-28 DIAGNOSIS — G9341 Metabolic encephalopathy: Secondary | ICD-10-CM | POA: Diagnosis not present

## 2022-05-28 DIAGNOSIS — E785 Hyperlipidemia, unspecified: Secondary | ICD-10-CM | POA: Diagnosis not present

## 2022-05-28 DIAGNOSIS — R41841 Cognitive communication deficit: Secondary | ICD-10-CM | POA: Diagnosis not present

## 2022-05-28 DIAGNOSIS — Z9181 History of falling: Secondary | ICD-10-CM | POA: Diagnosis not present

## 2022-05-28 DIAGNOSIS — E43 Unspecified severe protein-calorie malnutrition: Secondary | ICD-10-CM | POA: Diagnosis not present

## 2022-05-28 DIAGNOSIS — E782 Mixed hyperlipidemia: Secondary | ICD-10-CM | POA: Diagnosis not present

## 2022-05-28 DIAGNOSIS — R52 Pain, unspecified: Secondary | ICD-10-CM | POA: Diagnosis not present

## 2022-05-28 DIAGNOSIS — G20A1 Parkinson's disease without dyskinesia, without mention of fluctuations: Secondary | ICD-10-CM | POA: Diagnosis not present

## 2022-05-28 DIAGNOSIS — Z7401 Bed confinement status: Secondary | ICD-10-CM | POA: Diagnosis not present

## 2022-05-28 DIAGNOSIS — J180 Bronchopneumonia, unspecified organism: Secondary | ICD-10-CM | POA: Diagnosis not present

## 2022-05-28 DIAGNOSIS — W1789XD Other fall from one level to another, subsequent encounter: Secondary | ICD-10-CM | POA: Diagnosis not present

## 2022-05-28 DIAGNOSIS — J189 Pneumonia, unspecified organism: Secondary | ICD-10-CM | POA: Diagnosis not present

## 2022-05-28 DIAGNOSIS — S42124K Nondisplaced fracture of acromial process, right shoulder, subsequent encounter for fracture with nonunion: Secondary | ICD-10-CM | POA: Diagnosis not present

## 2022-05-28 DIAGNOSIS — J159 Unspecified bacterial pneumonia: Secondary | ICD-10-CM | POA: Diagnosis not present

## 2022-05-28 DIAGNOSIS — S72091D Other fracture of head and neck of right femur, subsequent encounter for closed fracture with routine healing: Secondary | ICD-10-CM | POA: Diagnosis not present

## 2022-05-28 DIAGNOSIS — S79929A Unspecified injury of unspecified thigh, initial encounter: Secondary | ICD-10-CM | POA: Diagnosis not present

## 2022-05-28 DIAGNOSIS — S72001A Fracture of unspecified part of neck of right femur, initial encounter for closed fracture: Secondary | ICD-10-CM | POA: Diagnosis not present

## 2022-05-28 MED ORDER — TAMSULOSIN HCL 0.4 MG PO CAPS: 0.4000 mg | ORAL_CAPSULE | Freq: Every day | ORAL | Status: DC

## 2022-05-28 MED ORDER — ASPIRIN 81 MG PO CHEW: 81.0000 mg | CHEWABLE_TABLET | Freq: Two times a day (BID) | ORAL | 0 refills | Status: AC

## 2022-05-28 MED ORDER — FERROUS SULFATE 325 (65 FE) MG PO TABS: 325.0000 mg | ORAL_TABLET | Freq: Every day | ORAL | 3 refills | Status: DC

## 2022-05-28 MED ORDER — CEFDINIR 300 MG PO CAPS: 300.0000 mg | ORAL_CAPSULE | Freq: Two times a day (BID) | ORAL | 0 refills | Status: AC

## 2022-05-28 MED ORDER — BOOST PLUS PO LIQD: 237.0000 mL | Freq: Three times a day (TID) | ORAL | 0 refills | Status: DC

## 2022-05-28 MED ORDER — POLYETHYLENE GLYCOL 3350 17 G PO PACK: 17.0000 g | PACK | Freq: Every day | ORAL | 0 refills | Status: DC

## 2022-05-28 MED ORDER — HYDROCODONE-ACETAMINOPHEN 5-325 MG PO TABS: 1.0000 | ORAL_TABLET | Freq: Four times a day (QID) | ORAL | 0 refills | Status: DC | PRN

## 2022-05-28 MED ORDER — AZITHROMYCIN 500 MG PO TABS: 500.0000 mg | ORAL_TABLET | Freq: Every day | ORAL | 0 refills | Status: AC

## 2022-05-28 MED ORDER — PROSIGHT PO TABS: 1.0000 | ORAL_TABLET | Freq: Every day | ORAL | 0 refills | Status: DC

## 2022-05-28 MED ORDER — CARBIDOPA-LEVODOPA 25-100 MG PO TABS: 3.0000 | ORAL_TABLET | Freq: Three times a day (TID) | ORAL | Status: DC

## 2022-05-28 MED ORDER — ONDANSETRON HCL 4 MG PO TABS: 4.0000 mg | ORAL_TABLET | Freq: Four times a day (QID) | ORAL | 0 refills | Status: DC | PRN

## 2022-05-28 MED ORDER — DOCUSATE SODIUM 100 MG PO CAPS: 100.0000 mg | ORAL_CAPSULE | Freq: Two times a day (BID) | ORAL | 0 refills | Status: DC

## 2022-05-28 NOTE — Plan of Care (Signed)
  Problem: Education: Goal: Knowledge of General Education information will improve Description: Including pain rating scale, medication(s)/side effects and non-pharmacologic comfort measures Outcome: Progressing   Problem: Health Behavior/Discharge Planning: Goal: Ability to manage health-related needs will improve Outcome: Progressing   Problem: Clinical Measurements: Goal: Ability to maintain clinical measurements within normal limits will improve Outcome: Progressing Goal: Will remain free from infection Outcome: Progressing Goal: Diagnostic test results will improve Outcome: Progressing Goal: Respiratory complications will improve Outcome: Progressing   Problem: Activity: Goal: Risk for activity intolerance will decrease Outcome: Progressing   Problem: Nutrition: Goal: Adequate nutrition will be maintained Outcome: Progressing   Problem: Coping: Goal: Level of anxiety will decrease Outcome: Progressing   Problem: Pain Managment: Goal: General experience of comfort will improve Outcome: Progressing   Problem: Safety: Goal: Ability to remain free from injury will improve Outcome: Progressing   Problem: Skin Integrity: Goal: Risk for impaired skin integrity will decrease Outcome: Progressing   Problem: Education: Goal: Verbalization of understanding the information provided (i.e., activity precautions, restrictions, etc) will improve Outcome: Progressing Goal: Individualized Educational Video(s) Outcome: Progressing   Problem: Activity: Goal: Ability to ambulate and perform ADLs will improve Outcome: Progressing   Problem: Self-Concept: Goal: Ability to maintain and perform role responsibilities to the fullest extent possible will improve Outcome: Progressing   Problem: Pain Management: Goal: Pain level will decrease Outcome: Progressing   Problem: Education: Goal: Knowledge of the prescribed therapeutic regimen will improve Outcome: Progressing Goal:  Understanding of discharge needs will improve Outcome: Progressing   Problem: Activity: Goal: Ability to avoid complications of mobility impairment will improve Outcome: Progressing Goal: Ability to tolerate increased activity will improve Outcome: Progressing   Problem: Clinical Measurements: Goal: Postoperative complications will be avoided or minimized Outcome: Progressing   Problem: Pain Management: Goal: Pain level will decrease with appropriate interventions Outcome: Progressing   Problem: Skin Integrity: Goal: Will show signs of wound healing Outcome: Progressing

## 2022-05-28 NOTE — Discharge Summary (Signed)
Physician Discharge Summary   Patient: Lee Martinez MRN: [id number] DOB: 1936-03-12  Admit date:     05/20/2022  Discharge date: 05/28/22  Discharge Physician: Vernelle Emerald   PCP: [name], FNP   Recommendations at discharge:   Patient may get out of bed with assistance using an assistive device Patient is to participate in physical therapy regimen per their assessment Patient is DNR Patient consumes a low-sodium diet Patient intermittently should have assistance with meals Please ensure patient is receiving a nutritional supplement such as Ensure or boost 3 times daily in between meals Patient has a Foley catheter in place, provide Foley catheter care per facility protocol Per urology recommendations, remove Foley and attempt voiding trial on 1/19.  If this is unsuccessful Foley may be reinserted Patient to follow-up with Dr. [name] with [facility] in 2 weeks, please call to ensure that the patient has an appointment Patient to follow-up with Dr. [name] with orthopedic surgery in 2 weeks, please call to ensure patient has an appointment    Discharge Diagnoses: Principal Problem:   Closed displaced fracture of right femoral neck ([place]) Active Problems:   Pneumonia of left lower lobe due to infectious organism   BPH with urinary obstruction   Acute metabolic encephalopathy   Parkinson disease   Mixed hyperlipidemia   Normocytic anemia   Lesion of pelvic bone   Major depressive disorder   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   Protein-calorie malnutrition, severe   Refusal of blood transfusions as patient is Jehovah's Witness   S/P total right hip arthroplasty  Resolved Problems:   * No resolved hospital problems. *   Hospital Course: 87 year old male with past medical history of Parkinson disease, abdominal aortic aneurysm, BPH, hyperlipidemia presenting to [facility] long hospital emergency department after suffering a mechanical  fall down some stairs at home with right hip pain.  Upon evaluation in the emergency department patient was found to have a displaced and angulated subcapital right femoral neck fracture.  The hospitalist group was then called to assess the patient for admission to the hospital.  Dr. [name] surgery was consulted and patient underwent right total hip replacement on 1/9 without immediate complications.  Patient has been evaluated by physical therapy and skilled physical therapy services in a skilled nursing facility has been recommended.  Patient is additionally began to exhibit urinary retention requiring intermittent urinary catheterization and initiation of Flomax.  Patient required Foley catheter placement early in the morning on 1/12 due to severe urinary retention.  Urology was consulted.  They recommended repeat voiding trial approximately 1/19 followed by outpatient urology follow-up.  Hospital course also complicated by significant anemia which was felt to be secondary to a combination of anemia of chronic disease and iron deficiency anemia.  Patient was managed with a combination of intravenous iron infusions and initiation of oral iron supplementation.    Hospital course additionally complicated by development of left lower lobe pneumonia with associated encephalopathy.  Patient was initiated on intravenous ceftriaxone and azithromycin on 1/14 for antibiotic coverage with subsequent resolution in encephalopathy and improvement in pneumonia.  Patient will be discharged to the skilled nursing facility on oral antibiotic therapy in the form of Omnicef and azithromycin.  Patient is being discharged to a skilled nursing facility in improved and stable condition on 05/28/2012 to participate in skilled physical therapy services.      Pain control - [employer] Controlled Substance Reporting System database was reviewed. and patient  was instructed, not to drive, operate heavy machinery, perform  activities at heights, swimming or participation in water activities or provide baby-sitting services while on Pain, Sleep and Anxiety Medications; until their outpatient Physician has advised to do so again. Also recommended to not to take more than prescribed Pain, Sleep and Anxiety Medications.   Consultants: Dr. [name] with Orthopedic Surgery, Dr. [name] with Urology Procedures performed:  Right total hip replacement perfomed by Dr. [name] 05/21/2022  Disposition: Skilled nursing facility Diet recommendation:  Discharge Diet Orders (From admission, onward)     Start     Ordered   05/28/22 0000  Diet - low sodium heart healthy        05/28/22 1229           Cardiac diet  DISCHARGE MEDICATION: Allergies as of 05/28/2022   No Known Allergies      Medication List     TAKE these medications    acetaminophen 500 MG tablet Commonly known as: TYLENOL Take 1 tablet (500 mg total) by mouth every 6 (six) hours as needed.   aspirin 81 MG chewable tablet Chew 1 tablet (81 mg total) by mouth 2 (two) times daily for 28 days.   atorvastatin 10 MG tablet Commonly known as: Lipitor Take 1 tab daily in evening MWF   azithromycin 500 MG tablet Commonly known as: Zithromax Take 1 tablet (500 mg total) by mouth daily for 3 days. Take 1 tablet daily for 3 days.   carbidopa-levodopa 25-100 MG tablet Commonly known as: SINEMET IR Take 3 tablets by mouth 3 (three) times daily. What changed: how much to take   cefdinir 300 MG capsule Commonly known as: OMNICEF Take 1 capsule (300 mg total) by mouth 2 (two) times daily for 5 days.   docusate sodium 100 MG capsule Commonly known as: COLACE Take 1 capsule (100 mg total) by mouth 2 (two) times daily.   ferrous sulfate 325 (65 FE) MG tablet Take 1 tablet (325 mg total) by mouth daily before breakfast. Take with a cup of orange juice or juice if possible Start taking on: May 29, 2022   HYDROcodone-acetaminophen 5-325 MG  tablet Commonly known as: NORCO/VICODIN Take 1 tablet by mouth every 6 (six) hours as needed for severe pain.   lactose free nutrition Liqd Take 237 mLs by mouth 3 (three) times daily between meals.   methocarbamol 500 MG tablet Commonly known as: ROBAXIN Take 1 tablet (500 mg total) by mouth every 6 (six) hours as needed for muscle spasms.   multivitamin Tabs tablet Take 1 tablet by mouth daily. Start taking on: May 29, 2022   ondansetron 4 MG tablet Commonly known as: ZOFRAN Take 1 tablet (4 mg total) by mouth every 6 (six) hours as needed for nausea.   polyethylene glycol 17 g packet Commonly known as: MIRALAX / GLYCOLAX Take 17 g by mouth daily. Do not give if patient is experiencing loose stools, diarrhea or frequent stools that day   sertraline 50 MG tablet Commonly known as: Zoloft Take 1 tablet (50 mg total) by mouth daily.   tamsulosin 0.4 MG Caps capsule Commonly known as: FLOMAX Take 1 capsule (0.4 mg total) by mouth daily. Start taking on: May 29, 2022        Contact information for follow-up providers     Paralee Cancel, MD. Schedule an appointment as soon as possible for a visit in 2 week(s).   Specialty: Orthopedic Surgery Contact information: [place]  [place] [address] [phone]         Ardis Hughs, MD Follow up in 2 week(s).   Specialty: Urology Contact information: [address] [place] [address] [phone]              Contact information for after-discharge care     Destination     [facility]erred SNF .   Service: Skilled Nursing Contact information: [place] [phone]                     Discharge Exam: Danley Danker Weights   05/20/22 2230  Weight: 62 kg    Constitutional: Awake alert and oriented x3, no associated distress.  Patient is cachectic. Respiratory: clear to auscultation bilaterally, no  wheezing, no crackles. Normal respiratory effort. No accessory muscle use.  Cardiovascular: Regular rate and rhythm, no murmurs / rubs / gallops. No extremity edema. 2+ pedal pulses. No carotid bruits.  Abdomen: Abdomen is soft and nontender.  No evidence of intra-abdominal masses.  Positive bowel sounds noted in all quadrants.   Musculoskeletal: No joint deformity upper and lower extremities. Good ROM, no contractures.  Poor muscle tone on    Condition at discharge: fair  The results of significant diagnostics from this hospitalization (including imaging, microbiology, ancillary and laboratory) are listed below for reference.   Imaging Studies: CT HEAD WO CONTRAST (5MM)  Result Date: 05/27/2022 CLINICAL DATA:  Initial evaluation for delirium. EXAM: CT HEAD WITHOUT CONTRAST TECHNIQUE: Contiguous axial images were obtained from the base of the skull through the vertex without intravenous contrast. RADIATION DOSE REDUCTION: This exam was performed according to the departmental dose-optimization program which includes automated exposure control, adjustment of the mA and/or kV according to patient size and/or use of iterative reconstruction technique. COMPARISON:  Prior study from 02/16/2022. FINDINGS: Brain: Examination degraded by motion artifact. Age-related cerebral atrophy with chronic microvascular ischemic disease. No acute intracranial hemorrhage. No visible acute large vessel territory infarct. No mass lesion, mass effect or midline shift. No hydrocephalus or extra-axial fluid collection. Vascular: No abnormal hyperdense vessel. Calcified atherosclerosis present at the skull base. Skull: Scalp soft tissues demonstrate no acute finding. Calvarium intact. Sinuses/Orbits: Globes and orbital soft tissues within normal limits. Mild mucosal thickening present about the ethmoidal air cells and maxillary sinuses. Paranasal sinuses are otherwise clear. No mastoid effusion. Other: None. IMPRESSION: 1. No  acute intracranial abnormality. 2. Age-related cerebral atrophy with chronic small vessel ischemic disease. Electronically Signed   By: Jeannine Boga M.D.   On: 05/27/2022 02:02   DG Chest 1 View  Result Date: 05/26/2022 CLINICAL DATA:  Pneumonia, 5 days post hip surgery EXAM: CHEST  1 VIEW COMPARISON:  05/20/2022 chest radiograph. FINDINGS: Stable cardiomediastinal silhouette with normal heart size. No pneumothorax. No pleural effusion. New patchy left lung base consolidation with some volume loss. No pulmonary edema. Stable moderate elevation of the right hemidiaphragm. Stable ununited fracture of the right acromion. IMPRESSION: New patchy left lung base consolidation with some volume loss, which could represent aspiration, pneumonia and/or atelectasis. Chest radiograph follow-up advised. Electronically Signed   By: Ilona Sorrel M.D.   On: 05/26/2022 15:36   DG Pelvis Portable  Result Date: 05/21/2022 CLINICAL DATA:  Right total hip replacement EXAM: PORTABLE PELVIS 1-2 VIEWS COMPARISON:  12/12/2021 FINDINGS: Total hip replacement on the right. Components appear well positioned. No radiographically detectable complication. IMPRESSION: Good appearance following total hip replacement on the right. Electronically Signed  By: Nelson Chimes M.D.   On: 05/21/2022 18:52   DG HIP UNILAT WITH PELVIS 1V RIGHT  Result Date: 05/21/2022 CLINICAL DATA:  Right total hip arthroplasty EXAM: DG HIP (WITH OR WITHOUT PELVIS) 1V RIGHT COMPARISON:  Radiograph of the right hips and pelvis dated 05/20/2022 FINDINGS: Two fluoroscopic images obtained during right total hip arthroplasty. 10 seconds fluoro time utilized. Radiation dose 1.0971 mGy Kerma. Please see performing physicians operative report for full details IMPRESSION: Fluoroscopic images were obtained for intraoperative guidance of right total hip arthroplasty. Electronically Signed   By: Darrin Nipper M.D.   On: 05/21/2022 18:24   DG C-Arm 1-60 Min-No  Report  Result Date: 05/21/2022 Fluoroscopy was utilized by the requesting physician.  No radiographic interpretation.   DG C-Arm 1-60 Min-No Report  Result Date: 05/21/2022 Fluoroscopy was utilized by the requesting physician.  No radiographic interpretation.   DG Lumbar Spine Complete  Result Date: 05/20/2022 CLINICAL DATA:  Fall, right hip fracture. EXAM: LUMBAR SPINE - COMPLETE 4+ VIEW COMPARISON:  12/12/2021 FINDINGS: The right femoral neck fracture is visible on the oblique projections. Cross-table lateral views of the lumbar spine demonstrate no subluxation or definite fracture, posterior elements are obscured by presumed backboard or bedding material. IMPRESSION: 1. Right femoral neck fracture. 2. No lumbar spine fracture or subluxation identified. Electronically Signed   By: Van Clines M.D.   On: 05/20/2022 18:36   DG Chest 1 View  Result Date: 05/20/2022 CLINICAL DATA:  Fall, right hip pain EXAM: CHEST  1 VIEW COMPARISON:  02/16/2022 FINDINGS: Nonunited fracture of the right acromion. Heterogeneous density and lucency along the left medial shoulder near the junction with the neck, probably from clothing or artifact, less likely to be from soft tissue plane disruption related to local trauma. I do not see a definite adjacent scapular, left clavicular, or left rib fracture to further explain this soft tissue appearance. Correlate with any tenderness along the left lower neck at the junction with the shoulder. Mildly elevated right hemidiaphragm.  The lungs appear clear. IMPRESSION: 1. Nonunited fracture of the right acromion. 2. Heterogeneous density and lucency along the left medial shoulder near the junction with the neck, probably from clothing or artifact, less likely to be from soft tissue plane disruption related to local trauma. Correlate with any tenderness along the left lower neck at the junction with the shoulder. 3. Stable elevation of the right hemidiaphragm. Electronically  Signed   By: Van Clines M.D.   On: 05/20/2022 18:34   DG Hip Unilat W or Wo Pelvis 2-3 Views Right  Result Date: 05/20/2022 CLINICAL DATA:  Pain after fall EXAM: DG HIP (WITH OR WITHOUT PELVIS) 3V RIGHT COMPARISON:  X-ray 12/12/2021 FINDINGS: Displaced and angulated subcapital femoral neck fracture on the right. Osteopenia. No additional fracture or dislocation. Hyperostosis. Surgical mesh along the pelvis. Scattered colonic stool. There is a sclerotic lesion along the proximal femur along the greater trochanter. Possibilities include a bone island. This is unchanged from prior x-ray. Subtle area of sclerosis along the posterosuperior left iliac bone is also stable from prior exams. Please correlate with prior workup. IMPRESSION: Displaced and angulated subcapital femoral neck fracture. Sclerotic lesions along the right proximal femur and the left iliac bone are stable from prior. Please correlate with prior workup. If needed a bone scan can be performed as clinically indicated. Electronically Signed   By: Jill Side M.D.   On: 05/20/2022 18:33    Microbiology: Results for orders placed or performed  during the hospital encounter of 05/20/22  Surgical pcr screen     Status: None   Collection Time: 05/21/22 12:07 PM   Specimen: Nasal Mucosa; Nasal Swab  Result Value Ref Range Status   MRSA, PCR NEGATIVE NEGATIVE Final   Staphylococcus aureus NEGATIVE NEGATIVE Final    Comment: (NOTE) The Xpert SA Assay (FDA approved for NASAL specimens in patients 76 years of age and older), is one component of a comprehensive surveillance program. It is not intended to diagnose infection nor to guide or monitor treatment. Performed at [facility], [place] [address] [place], [place] [address]   Culture, blood (Routine X 2) w Reflex to ID Panel     Status: None (Preliminary result)   Collection Time: 05/26/22 10:03 PM   Specimen: BLOOD  Result Value Ref Range Status   Specimen  Description   Final    BLOOD BLOOD LEFT HAND Performed at [facility] [address], [place], [place] [address]    Special Requests   Final    BOTTLES DRAWN AEROBIC ONLY Blood Culture adequate volume Performed at [facility] [address], [place], [place] [address]    Culture   Final    NO GROWTH 1 DAY Performed at [facility], [place] [address], [place], [place] [address]    Report Status PENDING  Incomplete  Culture, blood (Routine X 2) w Reflex to ID Panel     Status: None (Preliminary result)   Collection Time: 05/26/22 10:03 PM   Specimen: BLOOD  Result Value Ref Range Status   Specimen Description   Final    BLOOD BLOOD RIGHT HAND Performed at [facility] [address], [place], [place] [address]    Special Requests   Final    BOTTLES DRAWN AEROBIC ONLY Blood Culture adequate volume Performed at [facility] [address], [place], [place] [address]    Culture   Final    NO GROWTH 1 DAY Performed at [facility], [place] [address], [place], [place] [address]    Report Status PENDING  Incomplete    Labs: CBC: Recent Labs  Lab 05/25/22 0500 05/25/22 0955 05/25/22 1638 05/26/22 0348 05/27/22 0335  WBC 7.2 8.1 8.3 8.7 9.5  NEUTROABS 5.0  --   --   --  6.4  HGB 8.5* 9.3* 8.5* 8.4* 8.5*  HCT 26.1* 28.7* 26.2* 25.9* 26.6*  MCV 95.6 96.3 96.7 96.6 97.1  PLT 163 173 167 178 379   Basic Metabolic Panel: Recent Labs  Lab 05/22/22 0349 05/23/22 0350 05/24/22 0328 05/25/22 0500 05/27/22 0335  NA 139 137 135 138 139  K 4.7 4.5 3.9 3.8 4.2  CL 109 106 104 105 106  CO2 '22 26 23 26 25  '$ GLUCOSE 143* 112* 118* 116* 98  BUN 28* 28* 21 21 25*  CREATININE 1.01 0.89 0.85 0.71 0.83  CALCIUM 8.3* 8.4* 8.3* 8.4* 8.2*  MG  --  2.0 1.8 2.0 2.0  PHOS  --   --   --   --  2.7   Liver Function Tests: Recent Labs  Lab 05/25/22 0500 05/27/22 0335  AST 25 26  ALT 5 6  ALKPHOS 48  57  BILITOT 0.6 0.4  PROT 4.8* 5.6*  ALBUMIN 2.9* 2.6*   CBG: Recent Labs  Lab 05/26/22 1137  GLUCAP 103*    Discharge time spent: greater than 30 minutes.  Signed: Vernelle Emerald, MD  [facility] 05/28/2022

## 2022-05-28 NOTE — TOC Transition Note (Addendum)
Transition of Care [facility]) - CM/SW Discharge Note  Patient Details  Name: Lee Martinez MRN: [id number] Date of Birth: 01-29-1936  [facility] CM/SW Contact:  Sherie Don, LCSW Phone Number: 05/28/2022, 1:26 PM  Clinical Narrative: Patient is medically ready to discharge to SNF. CSW confirmed bed with Lexine Baton at [facility]. CSW completed insurance authorization in [place] portal. Plan auth ID # is: [id number]. Reference ID # is: [id number]. Patient has been approved for 05/28/2022-05/30/2022. Discharge summary, discharge orders, and SNF transfer report faxed to facility in hub. Medical necessity form done; PTAR scheduled. [name] Minor notified of discharge. Discharge packet completed. RN updated. TOC signing off.  AddendumRosalie Gums # is: [phone] E.  Final next level of care: Skilled Nursing Facility Barriers to Discharge: Barriers Resolved  Patient Goals and CMS Choice CMS Medicare.gov Compare Post Acute Care list provided to:: Patient Represenative (must comment) [name] (grandson/POA)) Choice offered to / list presented to : [place] / Guardian  Discharge Placement PASRR number recieved: 05/24/22   Patient [facility] bed at: [facility] Patient to be transferred to facility by: [place] Name of family member notified: [name] Patient and family notified of of transfer: 05/28/22  Discharge Plan and Services Additional resources added to the After Visit Summary for   In-house Referral: Clinical Social Work Post Acute Care Choice: [place]          DME Arranged: N/A DME Agency: NA  Social Determinants of Health ([place]) Interventions SDOH Screenings   Food Insecurity: No Food Insecurity (05/20/2022)  Housing: Low Risk  (05/20/2022)  Transportation Needs: No Transportation Needs (05/20/2022)  Utilities: Not At Risk (05/20/2022)  Depression (PHQ2-9): Medium Risk (02/07/2022)  Financial Resource Strain: Low Risk  (02/07/2022)  Physical Activity:  Inactive (02/07/2022)  Stress: No Stress Concern Present (02/07/2022)  Tobacco Use: Low Risk  (05/22/2022)   Readmission Risk Interventions     No data to display

## 2022-05-28 NOTE — Discharge Summary (Incomplete)
Physician Discharge Summary   Patient: Lee Martinez MRN: [id number] DOB: Oct 30, 1935  Admit date:     05/20/2022  Discharge date: {dischdate:26783}  Discharge Physician: Vernelle Emerald   PCP: [name], FNP   Recommendations at discharge:  {Tip this will not be part of the note when signed- Example include specific recommendations for outpatient follow-up, pending tests to follow-up on. (Optional):26781}  ***  Discharge Diagnoses: Principal Problem:   Closed displaced fracture of right femoral neck (HCC) Active Problems:   Pneumonia of left lower lobe due to infectious organism   BPH with urinary obstruction   Acute metabolic encephalopathy   Parkinson disease   Mixed hyperlipidemia   Normocytic anemia   Lesion of pelvic bone   Major depressive disorder   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   Protein-calorie malnutrition, severe   Refusal of blood transfusions as patient is Jehovah's Witness   S/P total right hip arthroplasty  Resolved Problems:   * No resolved hospital problems. *   Hospital Course: 87 year old male with past medical history of Parkinson disease, abdominal aortic aneurysm, BPH, hyperlipidemia presenting to [facility] emergency department after suffering a mechanical fall down some stairs at home with right hip pain.  Upon evaluation in the emergency department patient was found to have a displaced and angulated subcapital right femoral neck fracture.  The hospitalist group was then called to assess the patient for admission to the hospital.  Dr. [name] surgery was consulted and patient underwent right total hip replacement on 1/9 without immediate complications.  Patient has been evaluated by physical therapy and skilled physical therapy services in a skilled nursing facility has been recommended.  Patient is additionally began to exhibit urinary retention requiring intermittent urinary catheterization and  initiation of Flomax.  Patient required Foley catheter placement early in the morning on 1/12 due to severe urinary retention.  Urology was consulted.  They recommended repeat voiding trial approximately 1/19 followed by outpatient urology follow-up.  Hospital course also complicated by significant anemia which was felt to be secondary to a combination of anemia of chronic disease and iron deficiency anemia.  Patient was managed with a combination of intravenous iron infusions and initiation of oral iron supplementation.    Hospital course additionally complicated by development of left lower lobe pneumonia with associated encephalopathy.  Patient was initiated on intravenous ceftriaxone and azithromycin for antibiotic coverage with subsequent resolution in encephalopathy and improvement in pneumonia.  Patient will be discharged to the skilled nursing facility on oral antibiotic therapy    {Tip this will not be part of the note when signed Body mass index is 18.54 kg/m. ,  Nutrition Documentation    Flowsheet Row ED to Hosp-Admission (Current) from 05/20/2022 in [place]  Nutrition Problem Severe Malnutrition  Etiology chronic illness  [Parkinson's disease]  Nutrition Goal Patient will meet greater than or equal to 90% of their needs  Interventions Refer to RD note for recommendations, Boost Plus     ,  (Optional):26781}  {(NOTE) Pain control PDMP Statment (Optional):26782}  Consultants: *** Procedures performed: ***  Disposition: {Plan; Disposition:26390} Diet recommendation:  {Diet_Plan:26776}  DISCHARGE MEDICATION: Allergies as of 05/28/2022   No Known Allergies   Med Rec must be completed prior to using this [place]***       Contact information for follow-up providers     Paralee Cancel, MD. Schedule an appointment as soon as possible for a visit in 2 week(s).  Specialty: Orthopedic Surgery Contact information: [address] [place]  [place] [address] [phone]              Contact information for after-discharge care     Destination     [facility] Preferred SNF .   Service: Skilled Nursing Contact information: [place] [phone]                     Discharge Exam: Danley Danker Weights   05/20/22 2230  Weight: 62 kg   *** Constitutional: Awake alert and oriented x3, no associated distress.   Respiratory: clear to auscultation bilaterally, no wheezing, no crackles. Normal respiratory effort. No accessory muscle use.  Cardiovascular: Regular rate and rhythm, no murmurs / rubs / gallops. No extremity edema. 2+ pedal pulses. No carotid bruits.  Abdomen: Abdomen is soft and nontender.  No evidence of intra-abdominal masses.  Positive bowel sounds noted in all quadrants.   Musculoskeletal: No joint deformity upper and lower extremities. Good ROM, no contractures. Normal muscle tone.     Condition at discharge: {DC Condition:26389}  The results of significant diagnostics from this hospitalization (including imaging, microbiology, ancillary and laboratory) are listed below for reference.   Imaging Studies: CT HEAD WO CONTRAST (5MM)  Result Date: 05/27/2022 CLINICAL DATA:  Initial evaluation for delirium. EXAM: CT HEAD WITHOUT CONTRAST TECHNIQUE: Contiguous axial images were obtained from the base of the skull through the vertex without intravenous contrast. RADIATION DOSE REDUCTION: This exam was performed according to the departmental dose-optimization program which includes automated exposure control, adjustment of the mA and/or kV according to patient size and/or use of iterative reconstruction technique. COMPARISON:  Prior study from 02/16/2022. FINDINGS: Brain: Examination degraded by motion artifact. Age-related cerebral atrophy with chronic microvascular ischemic disease. No acute intracranial hemorrhage. No visible acute large vessel  territory infarct. No mass lesion, mass effect or midline shift. No hydrocephalus or extra-axial fluid collection. Vascular: No abnormal hyperdense vessel. Calcified atherosclerosis present at the skull base. Skull: Scalp soft tissues demonstrate no acute finding. Calvarium intact. Sinuses/Orbits: Globes and orbital soft tissues within normal limits. Mild mucosal thickening present about the ethmoidal air cells and maxillary sinuses. Paranasal sinuses are otherwise clear. No mastoid effusion. Other: None. IMPRESSION: 1. No acute intracranial abnormality. 2. Age-related cerebral atrophy with chronic small vessel ischemic disease. Electronically Signed   By: Jeannine Boga M.D.   On: 05/27/2022 02:02   DG Chest 1 View  Result Date: 05/26/2022 CLINICAL DATA:  Pneumonia, 5 days post hip surgery EXAM: CHEST  1 VIEW COMPARISON:  05/20/2022 chest radiograph. FINDINGS: Stable cardiomediastinal silhouette with normal heart size. No pneumothorax. No pleural effusion. New patchy left lung base consolidation with some volume loss. No pulmonary edema. Stable moderate elevation of the right hemidiaphragm. Stable ununited fracture of the right acromion. IMPRESSION: New patchy left lung base consolidation with some volume loss, which could represent aspiration, pneumonia and/or atelectasis. Chest radiograph follow-up advised. Electronically Signed   By: Ilona Sorrel M.D.   On: 05/26/2022 15:36   DG Pelvis Portable  Result Date: 05/21/2022 CLINICAL DATA:  Right total hip replacement EXAM: PORTABLE PELVIS 1-2 VIEWS COMPARISON:  12/12/2021 FINDINGS: Total hip replacement on the right. Components appear well positioned. No radiographically detectable complication. IMPRESSION: Good appearance following total hip replacement on the right. Electronically Signed   By: Nelson Chimes M.D.   On: 05/21/2022 18:52   DG HIP UNILAT WITH PELVIS 1V RIGHT  Result  Date: 05/21/2022 CLINICAL DATA:  Right total hip arthroplasty EXAM: DG  HIP (WITH OR WITHOUT PELVIS) 1V RIGHT COMPARISON:  Radiograph of the right hips and pelvis dated 05/20/2022 FINDINGS: Two fluoroscopic images obtained during right total hip arthroplasty. 10 seconds fluoro time utilized. Radiation dose 1.0971 mGy Kerma. Please see performing physicians operative report for full details IMPRESSION: Fluoroscopic images were obtained for intraoperative guidance of right total hip arthroplasty. Electronically Signed   By: Darrin Nipper M.D.   On: 05/21/2022 18:24   DG C-Arm 1-60 Min-No Report  Result Date: 05/21/2022 Fluoroscopy was utilized by the requesting physician.  No radiographic interpretation.   DG C-Arm 1-60 Min-No Report  Result Date: 05/21/2022 Fluoroscopy was utilized by the requesting physician.  No radiographic interpretation.   DG Lumbar Spine Complete  Result Date: 05/20/2022 CLINICAL DATA:  Fall, right hip fracture. EXAM: LUMBAR SPINE - COMPLETE 4+ VIEW COMPARISON:  12/12/2021 FINDINGS: The right femoral neck fracture is visible on the oblique projections. Cross-table lateral views of the lumbar spine demonstrate no subluxation or definite fracture, posterior elements are obscured by presumed backboard or bedding material. IMPRESSION: 1. Right femoral neck fracture. 2. No lumbar spine fracture or subluxation identified. Electronically Signed   By: Van Clines M.D.   On: 05/20/2022 18:36   DG Chest 1 View  Result Date: 05/20/2022 CLINICAL DATA:  Fall, right hip pain EXAM: CHEST  1 VIEW COMPARISON:  02/16/2022 FINDINGS: Nonunited fracture of the right acromion. Heterogeneous density and lucency along the left medial shoulder near the junction with the neck, probably from clothing or artifact, less likely to be from soft tissue plane disruption related to local trauma. I do not see a definite adjacent scapular, left clavicular, or left rib fracture to further explain this soft tissue appearance. Correlate with any tenderness along the left lower neck at the  junction with the shoulder. Mildly elevated right hemidiaphragm.  The lungs appear clear. IMPRESSION: 1. Nonunited fracture of the right acromion. 2. Heterogeneous density and lucency along the left medial shoulder near the junction with the neck, probably from clothing or artifact, less likely to be from soft tissue plane disruption related to local trauma. Correlate with any tenderness along the left lower neck at the junction with the shoulder. 3. Stable elevation of the right hemidiaphragm. Electronically Signed   By: Van Clines M.D.   On: 05/20/2022 18:34   DG Hip Unilat W or Wo Pelvis 2-3 Views Right  Result Date: 05/20/2022 CLINICAL DATA:  Pain after fall EXAM: DG HIP (WITH OR WITHOUT PELVIS) 3V RIGHT COMPARISON:  X-ray 12/12/2021 FINDINGS: Displaced and angulated subcapital femoral neck fracture on the right. Osteopenia. No additional fracture or dislocation. Hyperostosis. Surgical mesh along the pelvis. Scattered colonic stool. There is a sclerotic lesion along the proximal femur along the greater trochanter. Possibilities include a bone island. This is unchanged from prior x-ray. Subtle area of sclerosis along the posterosuperior left iliac bone is also stable from prior exams. Please correlate with prior workup. IMPRESSION: Displaced and angulated subcapital femoral neck fracture. Sclerotic lesions along the right proximal femur and the left iliac bone are stable from prior. Please correlate with prior workup. If needed a bone scan can be performed as clinically indicated. Electronically Signed   By: Jill Side M.D.   On: 05/20/2022 18:33    Microbiology: Results for orders placed or performed during the hospital encounter of 05/20/22  Surgical pcr screen     Status: None   Collection Time: 05/21/22  12:07 PM   Specimen: Nasal Mucosa; Nasal Swab  Result Value Ref Range Status   MRSA, PCR NEGATIVE NEGATIVE Final   Staphylococcus aureus NEGATIVE NEGATIVE Final    Comment: (NOTE) The  Xpert SA Assay (FDA approved for NASAL specimens in patients 21 years of age and older), is one component of a comprehensive surveillance program. It is not intended to diagnose infection nor to guide or monitor treatment. Performed at [facility], [place] [address], [place], [place] [address]   Culture, blood (Routine X 2) w Reflex to ID Panel     Status: None (Preliminary result)   Collection Time: 05/26/22 10:03 PM   Specimen: BLOOD  Result Value Ref Range Status   Specimen Description   Final    BLOOD BLOOD LEFT HAND Performed at [facility] [address], [place], [place] [address]    Special Requests   Final    BOTTLES DRAWN AEROBIC ONLY Blood Culture adequate volume Performed at [facility] [address], [place], [place] [address]    Culture   Final    NO GROWTH < 12 HOURS Performed at [facility] [address] [place], [place] [address]    Report Status PENDING  Incomplete  Culture, blood (Routine X 2) w Reflex to ID Panel     Status: None (Preliminary result)   Collection Time: 05/26/22 10:03 PM   Specimen: BLOOD  Result Value Ref Range Status   Specimen Description   Final    BLOOD BLOOD RIGHT HAND Performed at [facility] [address] [place], [place] [address]    Special Requests   Final    BOTTLES DRAWN AEROBIC ONLY Blood Culture adequate volume Performed at [facility] [address] [place], [place] [address]    Culture   Final    NO GROWTH < 12 HOURS Performed at [facility] [address] [place], [place] [address]    Report Status PENDING  Incomplete    Labs: CBC: Recent Labs  Lab 05/25/22 0500 05/25/22 0955 05/25/22 1638 05/26/22 0348 05/27/22 0335  WBC 7.2 8.1 8.3 8.7 9.5  NEUTROABS 5.0  --   --   --  6.4  HGB 8.5* 9.3* 8.5* 8.4* 8.5*  HCT 26.1* 28.7* 26.2* 25.9* 26.6*  MCV 95.6 96.3 96.7 96.6 97.1  PLT 163 173 167  178 962   Basic Metabolic Panel: Recent Labs  Lab 05/22/22 0349 05/23/22 0350 05/24/22 0328 05/25/22 0500 05/27/22 0335  NA 139 137 135 138 139  K 4.7 4.5 3.9 3.8 4.2  CL 109 106 104 105 106  CO2 '22 26 23 26 25  '$ GLUCOSE 143* 112* 118* 116* 98  BUN 28* 28* 21 21 25*  CREATININE 1.01 0.89 0.85 0.71 0.83  CALCIUM 8.3* 8.4* 8.3* 8.4* 8.2*  MG  --  2.0 1.8 2.0 2.0  PHOS  --   --   --   --  2.7   Liver Function Tests: Recent Labs  Lab 05/25/22 0500 05/27/22 0335  AST 25 26  ALT 5 6  ALKPHOS 48 57  BILITOT 0.6 0.4  PROT 4.8* 5.6*  ALBUMIN 2.9* 2.6*   CBG: Recent Labs  Lab 05/26/22 1137  GLUCAP 103*    Discharge time spent: {LESS THAN/GREATER THAN:26388} 30 minutes.  Signed: Vernelle Emerald, MD [facility] 05/28/2022

## 2022-05-28 NOTE — Plan of Care (Signed)
Plan of care reviewed.

## 2022-05-29 DIAGNOSIS — M6281 Muscle weakness (generalized): Secondary | ICD-10-CM | POA: Diagnosis not present

## 2022-05-30 DIAGNOSIS — R2689 Other abnormalities of gait and mobility: Secondary | ICD-10-CM | POA: Diagnosis not present

## 2022-05-30 DIAGNOSIS — M6281 Muscle weakness (generalized): Secondary | ICD-10-CM | POA: Diagnosis not present

## 2022-05-30 DIAGNOSIS — S72091D Other fracture of head and neck of right femur, subsequent encounter for closed fracture with routine healing: Secondary | ICD-10-CM | POA: Diagnosis not present

## 2022-05-30 DIAGNOSIS — R2681 Unsteadiness on feet: Secondary | ICD-10-CM | POA: Diagnosis not present

## 2022-05-30 DIAGNOSIS — G20A1 Parkinson's disease without dyskinesia, without mention of fluctuations: Secondary | ICD-10-CM | POA: Diagnosis not present

## 2022-05-30 DIAGNOSIS — E785 Hyperlipidemia, unspecified: Secondary | ICD-10-CM | POA: Diagnosis not present

## 2022-05-30 DIAGNOSIS — J180 Bronchopneumonia, unspecified organism: Secondary | ICD-10-CM | POA: Diagnosis not present

## 2022-05-30 DIAGNOSIS — Z9181 History of falling: Secondary | ICD-10-CM | POA: Diagnosis not present

## 2022-05-30 DIAGNOSIS — R52 Pain, unspecified: Secondary | ICD-10-CM | POA: Diagnosis not present

## 2022-06-01 LAB — CULTURE, BLOOD (ROUTINE X 2)
Culture: NO GROWTH
Culture: NO GROWTH
Special Requests: ADEQUATE
Special Requests: ADEQUATE

## 2022-06-03 DIAGNOSIS — J159 Unspecified bacterial pneumonia: Secondary | ICD-10-CM | POA: Diagnosis not present

## 2022-06-03 DIAGNOSIS — S72091D Other fracture of head and neck of right femur, subsequent encounter for closed fracture with routine healing: Secondary | ICD-10-CM | POA: Diagnosis not present

## 2022-06-03 DIAGNOSIS — R339 Retention of urine, unspecified: Secondary | ICD-10-CM | POA: Diagnosis not present

## 2022-06-03 DIAGNOSIS — M6281 Muscle weakness (generalized): Secondary | ICD-10-CM | POA: Diagnosis not present

## 2022-06-06 DIAGNOSIS — G20A1 Parkinson's disease without dyskinesia, without mention of fluctuations: Secondary | ICD-10-CM | POA: Diagnosis not present

## 2022-06-06 DIAGNOSIS — Z9181 History of falling: Secondary | ICD-10-CM | POA: Diagnosis not present

## 2022-06-06 DIAGNOSIS — S72091D Other fracture of head and neck of right femur, subsequent encounter for closed fracture with routine healing: Secondary | ICD-10-CM | POA: Diagnosis not present

## 2022-06-06 DIAGNOSIS — M6281 Muscle weakness (generalized): Secondary | ICD-10-CM | POA: Diagnosis not present

## 2022-06-06 DIAGNOSIS — R2681 Unsteadiness on feet: Secondary | ICD-10-CM | POA: Diagnosis not present

## 2022-06-06 DIAGNOSIS — R2689 Other abnormalities of gait and mobility: Secondary | ICD-10-CM | POA: Diagnosis not present

## 2022-06-08 DIAGNOSIS — D649 Anemia, unspecified: Secondary | ICD-10-CM | POA: Diagnosis not present

## 2022-06-10 ENCOUNTER — Ambulatory Visit: Payer: Medicare Other | Admitting: Nurse Practitioner

## 2022-06-10 DIAGNOSIS — D649 Anemia, unspecified: Secondary | ICD-10-CM | POA: Diagnosis not present

## 2022-06-10 DIAGNOSIS — R2681 Unsteadiness on feet: Secondary | ICD-10-CM | POA: Diagnosis not present

## 2022-06-10 DIAGNOSIS — S72091D Other fracture of head and neck of right femur, subsequent encounter for closed fracture with routine healing: Secondary | ICD-10-CM | POA: Diagnosis not present

## 2022-06-10 DIAGNOSIS — L821 Other seborrheic keratosis: Secondary | ICD-10-CM | POA: Diagnosis not present

## 2022-06-10 DIAGNOSIS — R339 Retention of urine, unspecified: Secondary | ICD-10-CM | POA: Diagnosis not present

## 2022-06-10 DIAGNOSIS — R2689 Other abnormalities of gait and mobility: Secondary | ICD-10-CM | POA: Diagnosis not present

## 2022-06-10 DIAGNOSIS — M6281 Muscle weakness (generalized): Secondary | ICD-10-CM | POA: Diagnosis not present

## 2022-06-10 DIAGNOSIS — Z9181 History of falling: Secondary | ICD-10-CM | POA: Diagnosis not present

## 2022-06-10 DIAGNOSIS — G20A1 Parkinson's disease without dyskinesia, without mention of fluctuations: Secondary | ICD-10-CM | POA: Diagnosis not present

## 2022-06-12 DIAGNOSIS — W1789XD Other fall from one level to another, subsequent encounter: Secondary | ICD-10-CM | POA: Diagnosis not present

## 2022-06-12 DIAGNOSIS — M6281 Muscle weakness (generalized): Secondary | ICD-10-CM | POA: Diagnosis not present

## 2022-06-13 DIAGNOSIS — M6281 Muscle weakness (generalized): Secondary | ICD-10-CM | POA: Diagnosis not present

## 2022-06-13 DIAGNOSIS — R2689 Other abnormalities of gait and mobility: Secondary | ICD-10-CM | POA: Diagnosis not present

## 2022-06-13 DIAGNOSIS — G20A1 Parkinson's disease without dyskinesia, without mention of fluctuations: Secondary | ICD-10-CM | POA: Diagnosis not present

## 2022-06-13 DIAGNOSIS — Z9181 History of falling: Secondary | ICD-10-CM | POA: Diagnosis not present

## 2022-06-13 DIAGNOSIS — R2681 Unsteadiness on feet: Secondary | ICD-10-CM | POA: Diagnosis not present

## 2022-06-13 DIAGNOSIS — S72091D Other fracture of head and neck of right femur, subsequent encounter for closed fracture with routine healing: Secondary | ICD-10-CM | POA: Diagnosis not present

## 2022-06-17 DIAGNOSIS — R2681 Unsteadiness on feet: Secondary | ICD-10-CM | POA: Diagnosis not present

## 2022-06-17 DIAGNOSIS — Z9181 History of falling: Secondary | ICD-10-CM | POA: Diagnosis not present

## 2022-06-17 DIAGNOSIS — S72091D Other fracture of head and neck of right femur, subsequent encounter for closed fracture with routine healing: Secondary | ICD-10-CM | POA: Diagnosis not present

## 2022-06-17 DIAGNOSIS — R339 Retention of urine, unspecified: Secondary | ICD-10-CM | POA: Diagnosis not present

## 2022-06-17 DIAGNOSIS — M6281 Muscle weakness (generalized): Secondary | ICD-10-CM | POA: Diagnosis not present

## 2022-06-17 DIAGNOSIS — D649 Anemia, unspecified: Secondary | ICD-10-CM | POA: Diagnosis not present

## 2022-06-17 DIAGNOSIS — G20A1 Parkinson's disease without dyskinesia, without mention of fluctuations: Secondary | ICD-10-CM | POA: Diagnosis not present

## 2022-06-17 DIAGNOSIS — R2689 Other abnormalities of gait and mobility: Secondary | ICD-10-CM | POA: Diagnosis not present

## 2022-06-19 DIAGNOSIS — R1312 Dysphagia, oropharyngeal phase: Secondary | ICD-10-CM | POA: Diagnosis not present

## 2022-06-19 DIAGNOSIS — F339 Major depressive disorder, recurrent, unspecified: Secondary | ICD-10-CM | POA: Diagnosis not present

## 2022-06-19 DIAGNOSIS — N401 Enlarged prostate with lower urinary tract symptoms: Secondary | ICD-10-CM | POA: Diagnosis not present

## 2022-06-19 DIAGNOSIS — E43 Unspecified severe protein-calorie malnutrition: Secondary | ICD-10-CM | POA: Diagnosis not present

## 2022-06-19 DIAGNOSIS — S72011D Unspecified intracapsular fracture of right femur, subsequent encounter for closed fracture with routine healing: Secondary | ICD-10-CM | POA: Diagnosis not present

## 2022-06-19 DIAGNOSIS — G20A1 Parkinson's disease without dyskinesia, without mention of fluctuations: Secondary | ICD-10-CM | POA: Diagnosis not present

## 2022-06-19 DIAGNOSIS — R338 Other retention of urine: Secondary | ICD-10-CM | POA: Diagnosis not present

## 2022-06-19 DIAGNOSIS — I714 Abdominal aortic aneurysm, without rupture, unspecified: Secondary | ICD-10-CM | POA: Diagnosis not present

## 2022-06-19 DIAGNOSIS — R41841 Cognitive communication deficit: Secondary | ICD-10-CM | POA: Diagnosis not present

## 2022-06-23 ENCOUNTER — Emergency Department (HOSPITAL_COMMUNITY): Payer: Medicare HMO

## 2022-06-23 ENCOUNTER — Emergency Department (HOSPITAL_COMMUNITY)
Admission: EM | Admit: 2022-06-23 | Discharge: 2022-06-23 | Disposition: A | Discharge status: Skilled Nursing Facility | Payer: Medicare HMO | Attending: Emergency Medicine | Admitting: Emergency Medicine

## 2022-06-23 ENCOUNTER — Other Ambulatory Visit: Payer: Self-pay

## 2022-06-23 DIAGNOSIS — G20A1 Parkinson's disease without dyskinesia, without mention of fluctuations: Secondary | ICD-10-CM | POA: Insufficient documentation

## 2022-06-23 DIAGNOSIS — M2578 Osteophyte, vertebrae: Secondary | ICD-10-CM | POA: Diagnosis not present

## 2022-06-23 DIAGNOSIS — Z7982 Long term (current) use of aspirin: Secondary | ICD-10-CM | POA: Diagnosis not present

## 2022-06-23 DIAGNOSIS — W010XXA Fall on same level from slipping, tripping and stumbling without subsequent striking against object, initial encounter: Secondary | ICD-10-CM | POA: Insufficient documentation

## 2022-06-23 DIAGNOSIS — Z96641 Presence of right artificial hip joint: Secondary | ICD-10-CM | POA: Insufficient documentation

## 2022-06-23 DIAGNOSIS — S0990XA Unspecified injury of head, initial encounter: Secondary | ICD-10-CM | POA: Diagnosis not present

## 2022-06-23 DIAGNOSIS — M47812 Spondylosis without myelopathy or radiculopathy, cervical region: Secondary | ICD-10-CM | POA: Diagnosis not present

## 2022-06-23 DIAGNOSIS — Y92129 Unspecified place in nursing home as the place of occurrence of the external cause: Secondary | ICD-10-CM | POA: Diagnosis not present

## 2022-06-23 DIAGNOSIS — M25551 Pain in right hip: Secondary | ICD-10-CM | POA: Diagnosis not present

## 2022-06-23 DIAGNOSIS — W19XXXA Unspecified fall, initial encounter: Secondary | ICD-10-CM | POA: Diagnosis not present

## 2022-06-23 DIAGNOSIS — M50221 Other cervical disc displacement at C4-C5 level: Secondary | ICD-10-CM | POA: Diagnosis not present

## 2022-06-23 DIAGNOSIS — S51812A Laceration without foreign body of left forearm, initial encounter: Secondary | ICD-10-CM | POA: Insufficient documentation

## 2022-06-23 DIAGNOSIS — I672 Cerebral atherosclerosis: Secondary | ICD-10-CM | POA: Diagnosis not present

## 2022-06-23 DIAGNOSIS — R262 Difficulty in walking, not elsewhere classified: Secondary | ICD-10-CM | POA: Diagnosis not present

## 2022-06-23 DIAGNOSIS — R0902 Hypoxemia: Secondary | ICD-10-CM | POA: Diagnosis not present

## 2022-06-23 DIAGNOSIS — Z7401 Bed confinement status: Secondary | ICD-10-CM | POA: Diagnosis not present

## 2022-06-23 DIAGNOSIS — S199XXA Unspecified injury of neck, initial encounter: Secondary | ICD-10-CM | POA: Diagnosis not present

## 2022-06-23 DIAGNOSIS — R519 Headache, unspecified: Secondary | ICD-10-CM | POA: Diagnosis not present

## 2022-06-23 LAB — URINALYSIS, ROUTINE W REFLEX MICROSCOPIC
Bacteria, UA: NONE SEEN
Bilirubin Urine: NEGATIVE
Glucose, UA: NEGATIVE mg/dL
Hgb urine dipstick: NEGATIVE
Ketones, ur: 5 mg/dL — AB
Leukocytes,Ua: NEGATIVE
Nitrite: NEGATIVE
Protein, ur: NEGATIVE mg/dL
Specific Gravity, Urine: 1.02 (ref 1.005–1.030)
pH: 5 (ref 5.0–8.0)

## 2022-06-23 LAB — BASIC METABOLIC PANEL
Anion gap: 10 (ref 5–15)
BUN: 19 mg/dL (ref 8–23)
CO2: 25 mmol/L (ref 22–32)
Calcium: 8.7 mg/dL — ABNORMAL LOW (ref 8.9–10.3)
Chloride: 105 mmol/L (ref 98–111)
Creatinine, Ser: 1.1 mg/dL (ref 0.61–1.24)
GFR, Estimated: >60 mL/min (ref >60)
Glucose, Bld: 120 mg/dL — ABNORMAL HIGH (ref 70–99)
Potassium: 4 mmol/L (ref 3.5–5.1)
Sodium: 140 mmol/L (ref 135–145)

## 2022-06-23 LAB — CBC WITH DIFFERENTIAL/PLATELET
Abs Immature Granulocytes: 0.02 10*3/uL (ref 0.00–0.07)
Basophils Absolute: 0.1 10*3/uL (ref 0.0–0.1)
Basophils Relative: 1 %
Eosinophils Absolute: 0.1 10*3/uL (ref 0.0–0.5)
Eosinophils Relative: 2 %
HCT: 35.1 % — ABNORMAL LOW (ref 39.0–52.0)
Hemoglobin: 11.5 g/dL — ABNORMAL LOW (ref 13.0–17.0)
Immature Granulocytes: 0 %
Lymphocytes Relative: 9 %
Lymphs Abs: 0.6 10*3/uL — ABNORMAL LOW (ref 0.7–4.0)
MCH: 31.9 pg (ref 26.0–34.0)
MCHC: 32.8 g/dL (ref 30.0–36.0)
MCV: 97.2 fL (ref 80.0–100.0)
Monocytes Absolute: 0.5 10*3/uL (ref 0.1–1.0)
Monocytes Relative: 8 %
Neutro Abs: 5.4 10*3/uL (ref 1.7–7.7)
Neutrophils Relative %: 80 %
Platelets: 196 10*3/uL (ref 150–400)
RBC: 3.61 MIL/uL — ABNORMAL LOW (ref 4.22–5.81)
RDW: 14.6 % (ref 11.5–15.5)
WBC: 6.7 10*3/uL (ref 4.0–10.5)
nRBC: 0 % (ref 0.0–0.2)

## 2022-06-23 MED ORDER — BACITRACIN ZINC 500 UNIT/GM EX OINT
TOPICAL_OINTMENT | Freq: Once | CUTANEOUS | Status: AC
  Administered 2022-06-23: 1 via TOPICAL
  Filled 2022-06-23: qty 0.9

## 2022-06-23 NOTE — ED Provider Notes (Signed)
[place] EMERGENCY DEPARTMENT AT [facility] Provider Note   CSN: [id number] Arrival date & time: 06/23/22  1353     History  Chief Complaint  Patient presents with   Lee Martinez is a 87 y.o. male.  With history of Parkinson's, right hip THA on 05/21/2022 who presents to the ED for evaluation of fall.  He is a nursing facility resident.  He states he got out of bed at approximately 10:00 this morning to go to the bathroom.  He lost his balance and fell forward.  He does not remember hitting his head or losing consciousness.  He does not take any blood thinners.  He states he was able to get up, use the bathroom and go back to bed.  He was then found later by nurses and had an abrasion to his left forearm and a bruise overlying his left eye.  Ambulance was called and patient was transferred here.  He denies all symptoms currently including headache, vision changes, dizziness, lightheadedness, chest pain, shortness of breath, abdominal pain, nausea, vomiting, pain in any of his extremities.  He denies prodromal symptoms including dizziness, lightheadedness or shortness of breath as well.  He states he has had falls like this in the past and they typically occur when he loses his balance.  He did land on his knees but specifically denies knee pain pain bilaterally.  He has chronic right hip pain and is status post right THA but has not changed recently.  He has had some urinary frequency but does not know for how long.   Fall       Home Medications Prior to Admission medications   Medication Sig Start Date End Date Taking? Authorizing Provider  acetaminophen (TYLENOL) 500 MG tablet Take 1 tablet (500 mg total) by mouth every 6 (six) hours as needed. 02/16/22   Jeanell Sparrow, DO  aspirin 81 MG chewable tablet Chew 1 tablet (81 mg total) by mouth 2 (two) times daily for 28 days. 05/28/22 06/25/22  Shalhoub, Sherryll Burger, MD  atorvastatin (LIPITOR) 10 MG tablet Take 1 tab daily  in evening MWF 02/07/22   [name], FNP  carbidopa-levodopa (SINEMET IR) 25-100 MG tablet Take 3 tablets by mouth 3 (three) times daily. 05/28/22   Shalhoub, Sherryll Burger, MD  docusate sodium (COLACE) 100 MG capsule Take 1 capsule (100 mg total) by mouth 2 (two) times daily. 05/28/22   Shalhoub, Sherryll Burger, MD  ferrous sulfate 325 (65 FE) MG tablet Take 1 tablet (325 mg total) by mouth daily before breakfast. Take with a cup of orange juice or juice if possible 05/29/22   Shalhoub, Sherryll Burger, MD  HYDROcodone-acetaminophen (NORCO/VICODIN) 5-325 MG tablet Take 1 tablet by mouth every 6 (six) hours as needed for severe pain. 05/28/22   Shalhoub, Sherryll Burger, MD  lactose free nutrition (BOOST PLUS) LIQD Take 237 mLs by mouth 3 (three) times daily between meals. 05/28/22   Shalhoub, Sherryll Burger, MD  methocarbamol (ROBAXIN) 500 MG tablet Take 1 tablet (500 mg total) by mouth every 6 (six) hours as needed for muscle spasms. 05/22/22   Irving Copas, PA-C  multivitamin (PROSIGHT) TABS tablet Take 1 tablet by mouth daily. 05/29/22   Shalhoub, Sherryll Burger, MD  ondansetron (ZOFRAN) 4 MG tablet Take 1 tablet (4 mg total) by mouth every 6 (six) hours as needed for nausea. 05/28/22   Shalhoub, Sherryll Burger, MD  polyethylene glycol (MIRALAX / GLYCOLAX) 17 g packet  Take 17 g by mouth daily. Do not give if patient is experiencing loose stools, diarrhea or frequent stools that day 05/28/22   Vernelle Emerald, MD  sertraline (ZOLOFT) 50 MG tablet Take 1 tablet (50 mg total) by mouth daily. 02/07/22 02/07/23  [name], FNP  tamsulosin (FLOMAX) 0.4 MG CAPS capsule Take 1 capsule (0.4 mg total) by mouth daily. 05/29/22   Shalhoub, Sherryll Burger, MD      Allergies    Patient has no known allergies.    Review of Systems   Review of Systems  Musculoskeletal:  Positive for arthralgias.  All other systems reviewed and are negative.   Physical Exam Updated Vital Signs BP 115/76   Pulse 77   Temp 97.6 F (36.4 C) (Oral)   Resp 18   Ht  5' 11"$  (1.803 m)   Wt 64.5 kg   SpO2 96%   BMI 19.83 kg/m  Physical Exam Vitals and nursing note reviewed.  Constitutional:      General: He is not in acute distress.    Appearance: He is well-developed.     Comments: Thin build  HENT:     Head: Normocephalic and atraumatic.     Mouth/Throat:     Mouth: Mucous membranes are moist.     Pharynx: Oropharynx is clear.  Eyes:     Extraocular Movements: Extraocular movements intact.     Conjunctiva/sclera: Conjunctivae normal.     Pupils: Pupils are equal, round, and reactive to light.  Cardiovascular:     Rate and Rhythm: Normal rate and regular rhythm.     Heart sounds: No murmur heard. Pulmonary:     Effort: Pulmonary effort is normal. No respiratory distress.     Breath sounds: Normal breath sounds.  Abdominal:     Palpations: Abdomen is soft.     Tenderness: There is no abdominal tenderness. There is no guarding or rebound.  Musculoskeletal:     Cervical back: Neck supple.     Comments: No midline C, T or L-spine tenderness.  No tenderness to palpation of the chest wall or ribs.  No tenderness to palpation of the hips or any of the extremities.  Skin:    General: Skin is warm and dry.     Capillary Refill: Capillary refill takes less than 2 seconds.     Comments: 3 large skin tear to the left forearm with oozing blood.  Small 1 cm x 1 cm bruise near the left eyebrow. 3 cm by 2 cm bruise overlying the left ASIS with yellowing which appears to be old  Neurological:     General: No focal deficit present.     Mental Status: He is alert and oriented to person, place, and time.     Comments: Tremor secondary to Parkinson's.  No facial asymmetry or slurred speech.  No pronator drift.  Strength 5 out of 5 in all extremities.  Sensation intact in all extremities.  Psychiatric:        Mood and Affect: Mood normal.     ED Results / Procedures / Treatments   Labs (all labs ordered are listed, but only abnormal results are  displayed) Labs Reviewed  URINALYSIS, ROUTINE W REFLEX MICROSCOPIC - Abnormal; Notable for the following components:      Result Value   Ketones, ur 5 (*)    All other components within normal limits  BASIC METABOLIC PANEL - Abnormal; Notable for the following components:   Glucose, Bld 120 (*)  Calcium 8.7 (*)    All other components within normal limits  CBC WITH DIFFERENTIAL/PLATELET - Abnormal; Notable for the following components:   RBC 3.61 (*)    Hemoglobin 11.5 (*)    HCT 35.1 (*)    Lymphs Abs 0.6 (*)    All other components within normal limits    EKG None  Radiology CT Head Wo Contrast  Result Date: 06/23/2022 CLINICAL DATA:  Head trauma, neck trauma EXAM: CT HEAD WITHOUT CONTRAST CT CERVICAL SPINE WITHOUT CONTRAST TECHNIQUE: Multidetector CT imaging of the head and cervical spine was performed following the standard protocol without intravenous contrast. Multiplanar CT image reconstructions of the cervical spine were also generated. RADIATION DOSE REDUCTION: This exam was performed according to the departmental dose-optimization program which includes automated exposure control, adjustment of the mA and/or kV according to patient size and/or use of iterative reconstruction technique. COMPARISON:  05/26/2022 FINDINGS: CT HEAD FINDINGS Brain: No evidence of acute infarction, hemorrhage, extra-axial collection, ventriculomegaly, or mass effect. Generalized cerebral atrophy. Periventricular white matter low attenuation likely secondary to microangiopathy. Vascular: Cerebrovascular atherosclerotic calcifications are noted. No hyperdense vessels. Skull: Negative for fracture or focal lesion. Sinuses/Orbits: Visualized portions of the orbits are unremarkable. Visualized portions of the paranasal sinuses are unremarkable. Visualized portions of the mastoid air cells are unremarkable. Other: None. CT CERVICAL SPINE FINDINGS Alignment: 3 mm anterolisthesis of C7 on T1 secondary to facet  disease. Skull base and vertebrae: No acute fracture. No primary bone lesion or focal pathologic process. Ununited right acromion fracture. Soft tissues and spinal canal: No prevertebral fluid or swelling. No visible canal hematoma. Disc levels: Degenerative disease with disc height loss C6-7 and to lesser extent C5-6 and C7-T1. At C2-3 there is moderate left facet arthropathy and mild left foraminal stenosis. At C3-4 there is moderate left facet arthropathy with mild left foraminal stenosis. At C4-5 there is moderate left facet arthropathy without significant foraminal stenosis. At C4-5 there is a small broad central disc protrusion. At C5-6 is a broad-based disc osteophyte complex, bilateral uncovertebral degenerative changes, and mild bilateral facet arthropathy. At C6-7 there is a broad-based disc osteophyte complex, bilateral uncovertebral degenerative changes and mild left foraminal stenosis. At C7-T1 there is bilateral facet arthropathy and mild left foraminal stenosis. Upper chest: Lung apices are clear. Other: No fluid collection or hematoma. IMPRESSION: 1. No acute intracranial pathology. 2. No acute osseous injury of the cervical spine. 3. Cervical spine spondylosis as described above. 4. Subacute-chronic ununited right acromion fracture. Electronically Signed   By: Kathreen Devoid M.D.   On: 06/23/2022 15:44   CT Cervical Spine Wo Contrast  Result Date: 06/23/2022 CLINICAL DATA:  Head trauma, neck trauma EXAM: CT HEAD WITHOUT CONTRAST CT CERVICAL SPINE WITHOUT CONTRAST TECHNIQUE: Multidetector CT imaging of the head and cervical spine was performed following the standard protocol without intravenous contrast. Multiplanar CT image reconstructions of the cervical spine were also generated. RADIATION DOSE REDUCTION: This exam was performed according to the departmental dose-optimization program which includes automated exposure control, adjustment of the mA and/or kV according to patient size and/or use of  iterative reconstruction technique. COMPARISON:  05/26/2022 FINDINGS: CT HEAD FINDINGS Brain: No evidence of acute infarction, hemorrhage, extra-axial collection, ventriculomegaly, or mass effect. Generalized cerebral atrophy. Periventricular white matter low attenuation likely secondary to microangiopathy. Vascular: Cerebrovascular atherosclerotic calcifications are noted. No hyperdense vessels. Skull: Negative for fracture or focal lesion. Sinuses/Orbits: Visualized portions of the orbits are unremarkable. Visualized portions of the paranasal sinuses are unremarkable. Visualized  portions of the mastoid air cells are unremarkable. Other: None. CT CERVICAL SPINE FINDINGS Alignment: 3 mm anterolisthesis of C7 on T1 secondary to facet disease. Skull base and vertebrae: No acute fracture. No primary bone lesion or focal pathologic process. Ununited right acromion fracture. Soft tissues and spinal canal: No prevertebral fluid or swelling. No visible canal hematoma. Disc levels: Degenerative disease with disc height loss C6-7 and to lesser extent C5-6 and C7-T1. At C2-3 there is moderate left facet arthropathy and mild left foraminal stenosis. At C3-4 there is moderate left facet arthropathy with mild left foraminal stenosis. At C4-5 there is moderate left facet arthropathy without significant foraminal stenosis. At C4-5 there is a small broad central disc protrusion. At C5-6 is a broad-based disc osteophyte complex, bilateral uncovertebral degenerative changes, and mild bilateral facet arthropathy. At C6-7 there is a broad-based disc osteophyte complex, bilateral uncovertebral degenerative changes and mild left foraminal stenosis. At C7-T1 there is bilateral facet arthropathy and mild left foraminal stenosis. Upper chest: Lung apices are clear. Other: No fluid collection or hematoma. IMPRESSION: 1. No acute intracranial pathology. 2. No acute osseous injury of the cervical spine. 3. Cervical spine spondylosis as  described above. 4. Subacute-chronic ununited right acromion fracture. Electronically Signed   By: Kathreen Devoid M.D.   On: 06/23/2022 15:44   DG Hip Unilat With Pelvis 2-3 Views Right  Result Date: 06/23/2022 CLINICAL DATA:  Fall with RIGHT hip pain. EXAM: DG HIP (WITH OR WITHOUT PELVIS) 2-3V RIGHT COMPARISON:  05/21/2022 and prior studies FINDINGS: There is no evidence of acute fracture or dislocation. RIGHT total hip arthroplasty changes noted. No focal bony lesions are present. IMPRESSION: No evidence of acute bony abnormality. Electronically Signed   By: Margarette Canada M.D.   On: 06/23/2022 14:54    Procedures Procedures    Medications Ordered in ED Medications  bacitracin ointment (1 Application Topical Given 06/23/22 1545)    ED Course/ Medical Decision Making/ A&P Clinical Course as of 06/23/22 1751  Sun Jun 23, 2022  1651 CT Head Wo Contrast I personally reviewed and interpreted the image.  No acute intracranial or cervical abnormalities [AS]  1652 DG Hip Unilat With Pelvis 2-3 Views Right I personally viewed and interpreted the image.  No acute osseous abnormalities [AS]  1654 Attempted to call [facility] multiple times to obtain more history and was unable to speak with anyone. [AS]    Clinical Course User Index [AS] Kyli Sorter, Grafton Folk, PA-C                             Medical Decision Making Amount and/or Complexity of Data Reviewed Labs: ordered. Radiology: ordered.  This patient presents to the ED for concern of fall, this involves an extensive number of treatment options, and is a complaint that carries with it a high risk of complications and morbidity.  The differential diagnosis includes UTI, mechanical fall, contusions secondary to fall, intracranial bleed  Co morbidities that complicate the patient evaluation  parkinsons, s/p R THA on 05/21/2022  My initial workup includes basic labs, urinalysis, CT head, x-ray right hip.  Patient declined pain  medication  Additional history obtained from: Nursing notes from this visit.  I ordered, reviewed and interpreted labs which include: BMP, CBC  I ordered imaging studies including CT head, C-spine, x-ray right hip I independently visualized and interpreted imaging which showed no abnormalities I agree with the radiologist interpretation  Afebrile,  hemodynamically stable.  87 year old male presents ED for evaluation of what sounds like a mechanical fall.  On my exam, he has 3 large skin tears to the left forearm and a small bruise overlying the left eyebrow that appear to be new. The skin tears were treated with bacitracin and non adherent dressings.  He has no complaints.  He has no tenderness to palpation anywhere.  He has intermittent episodes of brief confusion but is overall alert and oriented and answers questions appropriately.  His CT head, C-spine and x-ray right hip were unremarkable.  He declined pain medication in the ED.  Patient will be discharged back to nursing home in stable condition. Attempted to contact nursing facility for more history multiple times but was unable to speak with anyone.  He was given return precautions and encouraged to follow-up with his primary care provider. Stable at the time of admission.  At this time there does not appear to be any evidence of an acute emergency medical condition and the patient appears stable for discharge with appropriate outpatient follow up. Diagnosis was discussed with patient who verbalizes understanding of care plan and is agreeable to discharge. I have discussed return precautions with patient who verbalizes understanding. Patient encouraged to follow-up with their PCP within 1 week. All questions answered.  Patient's case discussed with Dr. Tamera Punt who agrees with plan to discharge with follow-up.   Note: Portions of this report may have been transcribed using voice recognition software. Every effort was made to ensure accuracy;  however, inadvertent computerized transcription errors may still be present.        Final Clinical Impression(s) / ED Diagnoses Final diagnoses:  Fall, initial encounter  Skin tear of left forearm without complication, initial encounter    Rx / DC Orders ED Discharge Orders     None         Roylene Reason, PA-C 06/23/22 1751    Malvin Johns, MD 06/23/22 [phone]

## 2022-06-23 NOTE — ED Notes (Signed)
Pt on call bell, went into room to check on patient and IV was pulled out with catheter intact and dressing placed on left forearm over skin tears pulled off. Pt stating, "I want to go home."

## 2022-06-23 NOTE — ED Notes (Signed)
[facility] onsite to transport pt home at this time

## 2022-06-23 NOTE — Discharge Instructions (Signed)
You have been seen today for your complaint of fall. Your lab work was reassuring. Your imaging was reassuring and showed no abnormalities. Home care instructions are as follows:  Try to have someone nearby when you get up. Change positions slowly. Follow up with: your primary care provider in one week for reevaluation. Please seek immediate medical care if you develop any of the following symptoms: You develop severe swelling around the wound. Your pain suddenly increases and is severe. You develop painful lumps near the wound or on skin anywhere else on your body. You have a red streak going away from your wound. The wound is on your hand or foot, and you cannot properly move a finger or toe. The wound is on your hand or foot, and you notice that your fingers or toes look pale or bluish. At this time there does not appear to be the presence of an emergent medical condition, however there is always the potential for conditions to change. Please read and follow the below instructions.  Do not take your medicine if  develop an itchy rash, swelling in your mouth or lips, or difficulty breathing; call 911 and seek immediate emergency medical attention if this occurs.  You may review your lab tests and imaging results in their entirety on your MyChart account.  Please discuss all results of fully with your primary care provider and other specialist at your follow-up visit.  Note: Portions of this text may have been transcribed using voice recognition software. Every effort was made to ensure accuracy; however, inadvertent computerized transcription errors may still be present.

## 2022-06-23 NOTE — ED Triage Notes (Signed)
Patient brought in by EMS after falling while at the SNF. Per report, unwitnessed fall. Staff at SNF stated when they went to check on patient at noon, noticed skin tear to the left forearm and bruising noted to the left eyelid. Also noted some bruising to the left abdominal area. Denies any blood thinners.

## 2022-06-23 NOTE — ED Notes (Addendum)
Called [facility] to inform them that we did a workup on the patient and everything checked out OK and that patient was being discharged. They stated they would call the nurse there to let them know and make sure it was OK for the patient to return. Gave the facility our phone number to call back if they have any issues or cannot accept the patient back. Will set up transportation with [facility] for patient to go home.

## 2022-06-23 NOTE — ED Notes (Signed)
Applied bacitracin ointment to left arm and rewrapped arm with telfa. Patient tolerated well.

## 2022-06-23 NOTE — ED Notes (Signed)
Called [facility] to arrange transportation home. Stated he was 8th in line to be taken home.

## 2022-06-25 DIAGNOSIS — I714 Abdominal aortic aneurysm, without rupture, unspecified: Secondary | ICD-10-CM | POA: Diagnosis not present

## 2022-06-25 DIAGNOSIS — R2681 Unsteadiness on feet: Secondary | ICD-10-CM | POA: Diagnosis not present

## 2022-06-25 DIAGNOSIS — F339 Major depressive disorder, recurrent, unspecified: Secondary | ICD-10-CM | POA: Diagnosis not present

## 2022-06-25 DIAGNOSIS — E43 Unspecified severe protein-calorie malnutrition: Secondary | ICD-10-CM | POA: Diagnosis not present

## 2022-06-25 DIAGNOSIS — S51802A Unspecified open wound of left forearm, initial encounter: Secondary | ICD-10-CM | POA: Diagnosis not present

## 2022-06-25 DIAGNOSIS — R1312 Dysphagia, oropharyngeal phase: Secondary | ICD-10-CM | POA: Diagnosis not present

## 2022-06-25 DIAGNOSIS — R338 Other retention of urine: Secondary | ICD-10-CM | POA: Diagnosis not present

## 2022-06-25 DIAGNOSIS — S72011D Unspecified intracapsular fracture of right femur, subsequent encounter for closed fracture with routine healing: Secondary | ICD-10-CM | POA: Diagnosis not present

## 2022-06-25 DIAGNOSIS — R41841 Cognitive communication deficit: Secondary | ICD-10-CM | POA: Diagnosis not present

## 2022-06-25 DIAGNOSIS — M6281 Muscle weakness (generalized): Secondary | ICD-10-CM | POA: Diagnosis not present

## 2022-06-25 DIAGNOSIS — R296 Repeated falls: Secondary | ICD-10-CM | POA: Diagnosis not present

## 2022-06-25 DIAGNOSIS — E46 Unspecified protein-calorie malnutrition: Secondary | ICD-10-CM | POA: Diagnosis not present

## 2022-06-25 DIAGNOSIS — G20A1 Parkinson's disease without dyskinesia, without mention of fluctuations: Secondary | ICD-10-CM | POA: Diagnosis not present

## 2022-06-25 DIAGNOSIS — R278 Other lack of coordination: Secondary | ICD-10-CM | POA: Diagnosis not present

## 2022-06-25 DIAGNOSIS — W010XXA Fall on same level from slipping, tripping and stumbling without subsequent striking against object, initial encounter: Secondary | ICD-10-CM | POA: Diagnosis not present

## 2022-06-25 DIAGNOSIS — N401 Enlarged prostate with lower urinary tract symptoms: Secondary | ICD-10-CM | POA: Diagnosis not present

## 2022-06-25 DIAGNOSIS — G20C Parkinsonism, unspecified: Secondary | ICD-10-CM | POA: Diagnosis not present

## 2022-06-26 DIAGNOSIS — S72011D Unspecified intracapsular fracture of right femur, subsequent encounter for closed fracture with routine healing: Secondary | ICD-10-CM | POA: Diagnosis not present

## 2022-06-26 DIAGNOSIS — F339 Major depressive disorder, recurrent, unspecified: Secondary | ICD-10-CM | POA: Diagnosis not present

## 2022-06-26 DIAGNOSIS — G20A1 Parkinson's disease without dyskinesia, without mention of fluctuations: Secondary | ICD-10-CM | POA: Diagnosis not present

## 2022-06-26 DIAGNOSIS — I714 Abdominal aortic aneurysm, without rupture, unspecified: Secondary | ICD-10-CM | POA: Diagnosis not present

## 2022-06-26 DIAGNOSIS — R338 Other retention of urine: Secondary | ICD-10-CM | POA: Diagnosis not present

## 2022-06-26 DIAGNOSIS — N401 Enlarged prostate with lower urinary tract symptoms: Secondary | ICD-10-CM | POA: Diagnosis not present

## 2022-06-26 DIAGNOSIS — R1312 Dysphagia, oropharyngeal phase: Secondary | ICD-10-CM | POA: Diagnosis not present

## 2022-06-26 DIAGNOSIS — R41841 Cognitive communication deficit: Secondary | ICD-10-CM | POA: Diagnosis not present

## 2022-06-26 DIAGNOSIS — E43 Unspecified severe protein-calorie malnutrition: Secondary | ICD-10-CM | POA: Diagnosis not present

## 2022-06-28 DIAGNOSIS — R296 Repeated falls: Secondary | ICD-10-CM | POA: Diagnosis not present

## 2022-06-28 DIAGNOSIS — F339 Major depressive disorder, recurrent, unspecified: Secondary | ICD-10-CM | POA: Diagnosis not present

## 2022-06-28 DIAGNOSIS — R41841 Cognitive communication deficit: Secondary | ICD-10-CM | POA: Diagnosis not present

## 2022-06-28 DIAGNOSIS — G20A1 Parkinson's disease without dyskinesia, without mention of fluctuations: Secondary | ICD-10-CM | POA: Diagnosis not present

## 2022-06-28 DIAGNOSIS — I714 Abdominal aortic aneurysm, without rupture, unspecified: Secondary | ICD-10-CM | POA: Diagnosis not present

## 2022-06-28 DIAGNOSIS — R2681 Unsteadiness on feet: Secondary | ICD-10-CM | POA: Diagnosis not present

## 2022-06-28 DIAGNOSIS — R1312 Dysphagia, oropharyngeal phase: Secondary | ICD-10-CM | POA: Diagnosis not present

## 2022-06-28 DIAGNOSIS — S72011D Unspecified intracapsular fracture of right femur, subsequent encounter for closed fracture with routine healing: Secondary | ICD-10-CM | POA: Diagnosis not present

## 2022-06-28 DIAGNOSIS — N401 Enlarged prostate with lower urinary tract symptoms: Secondary | ICD-10-CM | POA: Diagnosis not present

## 2022-06-28 DIAGNOSIS — R278 Other lack of coordination: Secondary | ICD-10-CM | POA: Diagnosis not present

## 2022-06-28 DIAGNOSIS — R338 Other retention of urine: Secondary | ICD-10-CM | POA: Diagnosis not present

## 2022-06-28 DIAGNOSIS — E43 Unspecified severe protein-calorie malnutrition: Secondary | ICD-10-CM | POA: Diagnosis not present

## 2022-07-01 DIAGNOSIS — R1312 Dysphagia, oropharyngeal phase: Secondary | ICD-10-CM | POA: Diagnosis not present

## 2022-07-01 DIAGNOSIS — R41841 Cognitive communication deficit: Secondary | ICD-10-CM | POA: Diagnosis not present

## 2022-07-01 DIAGNOSIS — N401 Enlarged prostate with lower urinary tract symptoms: Secondary | ICD-10-CM | POA: Diagnosis not present

## 2022-07-01 DIAGNOSIS — E43 Unspecified severe protein-calorie malnutrition: Secondary | ICD-10-CM | POA: Diagnosis not present

## 2022-07-01 DIAGNOSIS — I714 Abdominal aortic aneurysm, without rupture, unspecified: Secondary | ICD-10-CM | POA: Diagnosis not present

## 2022-07-01 DIAGNOSIS — S72011D Unspecified intracapsular fracture of right femur, subsequent encounter for closed fracture with routine healing: Secondary | ICD-10-CM | POA: Diagnosis not present

## 2022-07-01 DIAGNOSIS — G20A1 Parkinson's disease without dyskinesia, without mention of fluctuations: Secondary | ICD-10-CM | POA: Diagnosis not present

## 2022-07-01 DIAGNOSIS — F339 Major depressive disorder, recurrent, unspecified: Secondary | ICD-10-CM | POA: Diagnosis not present

## 2022-07-01 DIAGNOSIS — R338 Other retention of urine: Secondary | ICD-10-CM | POA: Diagnosis not present

## 2022-07-02 DIAGNOSIS — R278 Other lack of coordination: Secondary | ICD-10-CM | POA: Diagnosis not present

## 2022-07-02 DIAGNOSIS — R2681 Unsteadiness on feet: Secondary | ICD-10-CM | POA: Diagnosis not present

## 2022-07-02 DIAGNOSIS — R296 Repeated falls: Secondary | ICD-10-CM | POA: Diagnosis not present

## 2022-07-02 DIAGNOSIS — R41841 Cognitive communication deficit: Secondary | ICD-10-CM | POA: Diagnosis not present

## 2022-07-03 DIAGNOSIS — E43 Unspecified severe protein-calorie malnutrition: Secondary | ICD-10-CM | POA: Diagnosis not present

## 2022-07-03 DIAGNOSIS — S72011D Unspecified intracapsular fracture of right femur, subsequent encounter for closed fracture with routine healing: Secondary | ICD-10-CM | POA: Diagnosis not present

## 2022-07-03 DIAGNOSIS — R41841 Cognitive communication deficit: Secondary | ICD-10-CM | POA: Diagnosis not present

## 2022-07-03 DIAGNOSIS — R338 Other retention of urine: Secondary | ICD-10-CM | POA: Diagnosis not present

## 2022-07-03 DIAGNOSIS — G20A1 Parkinson's disease without dyskinesia, without mention of fluctuations: Secondary | ICD-10-CM | POA: Diagnosis not present

## 2022-07-03 DIAGNOSIS — I714 Abdominal aortic aneurysm, without rupture, unspecified: Secondary | ICD-10-CM | POA: Diagnosis not present

## 2022-07-03 DIAGNOSIS — N401 Enlarged prostate with lower urinary tract symptoms: Secondary | ICD-10-CM | POA: Diagnosis not present

## 2022-07-03 DIAGNOSIS — R1312 Dysphagia, oropharyngeal phase: Secondary | ICD-10-CM | POA: Diagnosis not present

## 2022-07-03 DIAGNOSIS — F339 Major depressive disorder, recurrent, unspecified: Secondary | ICD-10-CM | POA: Diagnosis not present

## 2022-07-04 DIAGNOSIS — R1312 Dysphagia, oropharyngeal phase: Secondary | ICD-10-CM | POA: Diagnosis not present

## 2022-07-04 DIAGNOSIS — R338 Other retention of urine: Secondary | ICD-10-CM | POA: Diagnosis not present

## 2022-07-04 DIAGNOSIS — F339 Major depressive disorder, recurrent, unspecified: Secondary | ICD-10-CM | POA: Diagnosis not present

## 2022-07-04 DIAGNOSIS — N401 Enlarged prostate with lower urinary tract symptoms: Secondary | ICD-10-CM | POA: Diagnosis not present

## 2022-07-04 DIAGNOSIS — G20A1 Parkinson's disease without dyskinesia, without mention of fluctuations: Secondary | ICD-10-CM | POA: Diagnosis not present

## 2022-07-04 DIAGNOSIS — S72011D Unspecified intracapsular fracture of right femur, subsequent encounter for closed fracture with routine healing: Secondary | ICD-10-CM | POA: Diagnosis not present

## 2022-07-04 DIAGNOSIS — R41841 Cognitive communication deficit: Secondary | ICD-10-CM | POA: Diagnosis not present

## 2022-07-04 DIAGNOSIS — I714 Abdominal aortic aneurysm, without rupture, unspecified: Secondary | ICD-10-CM | POA: Diagnosis not present

## 2022-07-04 DIAGNOSIS — E43 Unspecified severe protein-calorie malnutrition: Secondary | ICD-10-CM | POA: Diagnosis not present

## 2022-07-05 DIAGNOSIS — R41841 Cognitive communication deficit: Secondary | ICD-10-CM | POA: Diagnosis not present

## 2022-07-05 DIAGNOSIS — F339 Major depressive disorder, recurrent, unspecified: Secondary | ICD-10-CM | POA: Diagnosis not present

## 2022-07-05 DIAGNOSIS — R1312 Dysphagia, oropharyngeal phase: Secondary | ICD-10-CM | POA: Diagnosis not present

## 2022-07-05 DIAGNOSIS — S72011D Unspecified intracapsular fracture of right femur, subsequent encounter for closed fracture with routine healing: Secondary | ICD-10-CM | POA: Diagnosis not present

## 2022-07-05 DIAGNOSIS — R2681 Unsteadiness on feet: Secondary | ICD-10-CM | POA: Diagnosis not present

## 2022-07-05 DIAGNOSIS — N401 Enlarged prostate with lower urinary tract symptoms: Secondary | ICD-10-CM | POA: Diagnosis not present

## 2022-07-05 DIAGNOSIS — R296 Repeated falls: Secondary | ICD-10-CM | POA: Diagnosis not present

## 2022-07-05 DIAGNOSIS — R338 Other retention of urine: Secondary | ICD-10-CM | POA: Diagnosis not present

## 2022-07-05 DIAGNOSIS — G20A1 Parkinson's disease without dyskinesia, without mention of fluctuations: Secondary | ICD-10-CM | POA: Diagnosis not present

## 2022-07-05 DIAGNOSIS — R278 Other lack of coordination: Secondary | ICD-10-CM | POA: Diagnosis not present

## 2022-07-05 DIAGNOSIS — E43 Unspecified severe protein-calorie malnutrition: Secondary | ICD-10-CM | POA: Diagnosis not present

## 2022-07-05 DIAGNOSIS — I714 Abdominal aortic aneurysm, without rupture, unspecified: Secondary | ICD-10-CM | POA: Diagnosis not present

## 2022-07-09 DIAGNOSIS — E43 Unspecified severe protein-calorie malnutrition: Secondary | ICD-10-CM | POA: Diagnosis not present

## 2022-07-09 DIAGNOSIS — I714 Abdominal aortic aneurysm, without rupture, unspecified: Secondary | ICD-10-CM | POA: Diagnosis not present

## 2022-07-09 DIAGNOSIS — N401 Enlarged prostate with lower urinary tract symptoms: Secondary | ICD-10-CM | POA: Diagnosis not present

## 2022-07-09 DIAGNOSIS — G20A1 Parkinson's disease without dyskinesia, without mention of fluctuations: Secondary | ICD-10-CM | POA: Diagnosis not present

## 2022-07-09 DIAGNOSIS — R1312 Dysphagia, oropharyngeal phase: Secondary | ICD-10-CM | POA: Diagnosis not present

## 2022-07-09 DIAGNOSIS — F339 Major depressive disorder, recurrent, unspecified: Secondary | ICD-10-CM | POA: Diagnosis not present

## 2022-07-09 DIAGNOSIS — G20C Parkinsonism, unspecified: Secondary | ICD-10-CM | POA: Diagnosis not present

## 2022-07-09 DIAGNOSIS — S72011D Unspecified intracapsular fracture of right femur, subsequent encounter for closed fracture with routine healing: Secondary | ICD-10-CM | POA: Diagnosis not present

## 2022-07-09 DIAGNOSIS — R338 Other retention of urine: Secondary | ICD-10-CM | POA: Diagnosis not present

## 2022-07-09 DIAGNOSIS — R41841 Cognitive communication deficit: Secondary | ICD-10-CM | POA: Diagnosis not present

## 2022-07-10 DIAGNOSIS — S72011D Unspecified intracapsular fracture of right femur, subsequent encounter for closed fracture with routine healing: Secondary | ICD-10-CM | POA: Diagnosis not present

## 2022-07-10 DIAGNOSIS — N401 Enlarged prostate with lower urinary tract symptoms: Secondary | ICD-10-CM | POA: Diagnosis not present

## 2022-07-10 DIAGNOSIS — R338 Other retention of urine: Secondary | ICD-10-CM | POA: Diagnosis not present

## 2022-07-10 DIAGNOSIS — F339 Major depressive disorder, recurrent, unspecified: Secondary | ICD-10-CM | POA: Diagnosis not present

## 2022-07-10 DIAGNOSIS — R41841 Cognitive communication deficit: Secondary | ICD-10-CM | POA: Diagnosis not present

## 2022-07-10 DIAGNOSIS — I714 Abdominal aortic aneurysm, without rupture, unspecified: Secondary | ICD-10-CM | POA: Diagnosis not present

## 2022-07-10 DIAGNOSIS — G20A1 Parkinson's disease without dyskinesia, without mention of fluctuations: Secondary | ICD-10-CM | POA: Diagnosis not present

## 2022-07-10 DIAGNOSIS — R1312 Dysphagia, oropharyngeal phase: Secondary | ICD-10-CM | POA: Diagnosis not present

## 2022-07-10 DIAGNOSIS — E43 Unspecified severe protein-calorie malnutrition: Secondary | ICD-10-CM | POA: Diagnosis not present

## 2022-07-11 DIAGNOSIS — F339 Major depressive disorder, recurrent, unspecified: Secondary | ICD-10-CM | POA: Diagnosis not present

## 2022-07-11 DIAGNOSIS — N401 Enlarged prostate with lower urinary tract symptoms: Secondary | ICD-10-CM | POA: Diagnosis not present

## 2022-07-11 DIAGNOSIS — R338 Other retention of urine: Secondary | ICD-10-CM | POA: Diagnosis not present

## 2022-07-11 DIAGNOSIS — R41841 Cognitive communication deficit: Secondary | ICD-10-CM | POA: Diagnosis not present

## 2022-07-11 DIAGNOSIS — E43 Unspecified severe protein-calorie malnutrition: Secondary | ICD-10-CM | POA: Diagnosis not present

## 2022-07-11 DIAGNOSIS — G20A1 Parkinson's disease without dyskinesia, without mention of fluctuations: Secondary | ICD-10-CM | POA: Diagnosis not present

## 2022-07-11 DIAGNOSIS — R1312 Dysphagia, oropharyngeal phase: Secondary | ICD-10-CM | POA: Diagnosis not present

## 2022-07-11 DIAGNOSIS — I714 Abdominal aortic aneurysm, without rupture, unspecified: Secondary | ICD-10-CM | POA: Diagnosis not present

## 2022-07-11 DIAGNOSIS — S72011D Unspecified intracapsular fracture of right femur, subsequent encounter for closed fracture with routine healing: Secondary | ICD-10-CM | POA: Diagnosis not present

## 2022-07-12 DIAGNOSIS — R1312 Dysphagia, oropharyngeal phase: Secondary | ICD-10-CM | POA: Diagnosis not present

## 2022-07-12 DIAGNOSIS — N401 Enlarged prostate with lower urinary tract symptoms: Secondary | ICD-10-CM | POA: Diagnosis not present

## 2022-07-12 DIAGNOSIS — S72011D Unspecified intracapsular fracture of right femur, subsequent encounter for closed fracture with routine healing: Secondary | ICD-10-CM | POA: Diagnosis not present

## 2022-07-12 DIAGNOSIS — F339 Major depressive disorder, recurrent, unspecified: Secondary | ICD-10-CM | POA: Diagnosis not present

## 2022-07-12 DIAGNOSIS — R338 Other retention of urine: Secondary | ICD-10-CM | POA: Diagnosis not present

## 2022-07-12 DIAGNOSIS — E43 Unspecified severe protein-calorie malnutrition: Secondary | ICD-10-CM | POA: Diagnosis not present

## 2022-07-12 DIAGNOSIS — I714 Abdominal aortic aneurysm, without rupture, unspecified: Secondary | ICD-10-CM | POA: Diagnosis not present

## 2022-07-12 DIAGNOSIS — R41841 Cognitive communication deficit: Secondary | ICD-10-CM | POA: Diagnosis not present

## 2022-07-12 DIAGNOSIS — G20A1 Parkinson's disease without dyskinesia, without mention of fluctuations: Secondary | ICD-10-CM | POA: Diagnosis not present

## 2022-07-16 DIAGNOSIS — G20C Parkinsonism, unspecified: Secondary | ICD-10-CM | POA: Diagnosis not present

## 2022-07-16 DIAGNOSIS — F339 Major depressive disorder, recurrent, unspecified: Secondary | ICD-10-CM | POA: Diagnosis not present

## 2022-07-16 DIAGNOSIS — R338 Other retention of urine: Secondary | ICD-10-CM | POA: Diagnosis not present

## 2022-07-16 DIAGNOSIS — R1312 Dysphagia, oropharyngeal phase: Secondary | ICD-10-CM | POA: Diagnosis not present

## 2022-07-16 DIAGNOSIS — N401 Enlarged prostate with lower urinary tract symptoms: Secondary | ICD-10-CM | POA: Diagnosis not present

## 2022-07-16 DIAGNOSIS — R41841 Cognitive communication deficit: Secondary | ICD-10-CM | POA: Diagnosis not present

## 2022-07-16 DIAGNOSIS — E43 Unspecified severe protein-calorie malnutrition: Secondary | ICD-10-CM | POA: Diagnosis not present

## 2022-07-16 DIAGNOSIS — I714 Abdominal aortic aneurysm, without rupture, unspecified: Secondary | ICD-10-CM | POA: Diagnosis not present

## 2022-07-16 DIAGNOSIS — G20A1 Parkinson's disease without dyskinesia, without mention of fluctuations: Secondary | ICD-10-CM | POA: Diagnosis not present

## 2022-07-16 DIAGNOSIS — S72011D Unspecified intracapsular fracture of right femur, subsequent encounter for closed fracture with routine healing: Secondary | ICD-10-CM | POA: Diagnosis not present

## 2022-07-18 DIAGNOSIS — F339 Major depressive disorder, recurrent, unspecified: Secondary | ICD-10-CM | POA: Diagnosis not present

## 2022-07-18 DIAGNOSIS — S72011D Unspecified intracapsular fracture of right femur, subsequent encounter for closed fracture with routine healing: Secondary | ICD-10-CM | POA: Diagnosis not present

## 2022-07-18 DIAGNOSIS — I714 Abdominal aortic aneurysm, without rupture, unspecified: Secondary | ICD-10-CM | POA: Diagnosis not present

## 2022-07-18 DIAGNOSIS — G20A1 Parkinson's disease without dyskinesia, without mention of fluctuations: Secondary | ICD-10-CM | POA: Diagnosis not present

## 2022-07-18 DIAGNOSIS — R338 Other retention of urine: Secondary | ICD-10-CM | POA: Diagnosis not present

## 2022-07-18 DIAGNOSIS — E43 Unspecified severe protein-calorie malnutrition: Secondary | ICD-10-CM | POA: Diagnosis not present

## 2022-07-18 DIAGNOSIS — F331 Major depressive disorder, recurrent, moderate: Secondary | ICD-10-CM | POA: Diagnosis not present

## 2022-07-18 DIAGNOSIS — R1312 Dysphagia, oropharyngeal phase: Secondary | ICD-10-CM | POA: Diagnosis not present

## 2022-07-18 DIAGNOSIS — F02818 Dementia in other diseases classified elsewhere, unspecified severity, with other behavioral disturbance: Secondary | ICD-10-CM | POA: Diagnosis not present

## 2022-07-18 DIAGNOSIS — R41841 Cognitive communication deficit: Secondary | ICD-10-CM | POA: Diagnosis not present

## 2022-07-18 DIAGNOSIS — N401 Enlarged prostate with lower urinary tract symptoms: Secondary | ICD-10-CM | POA: Diagnosis not present

## 2022-07-18 DIAGNOSIS — G20B1 Parkinson's disease with dyskinesia, without mention of fluctuations: Secondary | ICD-10-CM | POA: Diagnosis not present

## 2022-07-18 DIAGNOSIS — G3183 Dementia with Lewy bodies: Secondary | ICD-10-CM | POA: Diagnosis not present

## 2022-07-19 DIAGNOSIS — R338 Other retention of urine: Secondary | ICD-10-CM | POA: Diagnosis not present

## 2022-07-19 DIAGNOSIS — I714 Abdominal aortic aneurysm, without rupture, unspecified: Secondary | ICD-10-CM | POA: Diagnosis not present

## 2022-07-19 DIAGNOSIS — F339 Major depressive disorder, recurrent, unspecified: Secondary | ICD-10-CM | POA: Diagnosis not present

## 2022-07-19 DIAGNOSIS — E43 Unspecified severe protein-calorie malnutrition: Secondary | ICD-10-CM | POA: Diagnosis not present

## 2022-07-19 DIAGNOSIS — G20A1 Parkinson's disease without dyskinesia, without mention of fluctuations: Secondary | ICD-10-CM | POA: Diagnosis not present

## 2022-07-19 DIAGNOSIS — R1312 Dysphagia, oropharyngeal phase: Secondary | ICD-10-CM | POA: Diagnosis not present

## 2022-07-19 DIAGNOSIS — R41841 Cognitive communication deficit: Secondary | ICD-10-CM | POA: Diagnosis not present

## 2022-07-19 DIAGNOSIS — S72011D Unspecified intracapsular fracture of right femur, subsequent encounter for closed fracture with routine healing: Secondary | ICD-10-CM | POA: Diagnosis not present

## 2022-07-19 DIAGNOSIS — N401 Enlarged prostate with lower urinary tract symptoms: Secondary | ICD-10-CM | POA: Diagnosis not present

## 2022-07-23 DIAGNOSIS — G20A1 Parkinson's disease without dyskinesia, without mention of fluctuations: Secondary | ICD-10-CM | POA: Diagnosis not present

## 2022-07-23 DIAGNOSIS — E43 Unspecified severe protein-calorie malnutrition: Secondary | ICD-10-CM | POA: Diagnosis not present

## 2022-07-23 DIAGNOSIS — R338 Other retention of urine: Secondary | ICD-10-CM | POA: Diagnosis not present

## 2022-07-23 DIAGNOSIS — F339 Major depressive disorder, recurrent, unspecified: Secondary | ICD-10-CM | POA: Diagnosis not present

## 2022-07-23 DIAGNOSIS — R1312 Dysphagia, oropharyngeal phase: Secondary | ICD-10-CM | POA: Diagnosis not present

## 2022-07-23 DIAGNOSIS — I714 Abdominal aortic aneurysm, without rupture, unspecified: Secondary | ICD-10-CM | POA: Diagnosis not present

## 2022-07-23 DIAGNOSIS — N401 Enlarged prostate with lower urinary tract symptoms: Secondary | ICD-10-CM | POA: Diagnosis not present

## 2022-07-23 DIAGNOSIS — S72011D Unspecified intracapsular fracture of right femur, subsequent encounter for closed fracture with routine healing: Secondary | ICD-10-CM | POA: Diagnosis not present

## 2022-07-23 DIAGNOSIS — R41841 Cognitive communication deficit: Secondary | ICD-10-CM | POA: Diagnosis not present

## 2022-07-24 DIAGNOSIS — E559 Vitamin D deficiency, unspecified: Secondary | ICD-10-CM | POA: Diagnosis not present

## 2022-07-24 DIAGNOSIS — Z79899 Other long term (current) drug therapy: Secondary | ICD-10-CM | POA: Diagnosis not present

## 2022-07-26 ENCOUNTER — Emergency Department (HOSPITAL_COMMUNITY): Payer: Medicare HMO

## 2022-07-26 ENCOUNTER — Other Ambulatory Visit: Payer: Self-pay

## 2022-07-26 ENCOUNTER — Emergency Department (HOSPITAL_COMMUNITY)
Admission: EM | Admit: 2022-07-26 | Discharge: 2022-07-26 | Disposition: A | Discharge status: Home / Self Care | Payer: Medicare HMO | Attending: Emergency Medicine | Admitting: Emergency Medicine

## 2022-07-26 ENCOUNTER — Encounter (HOSPITAL_COMMUNITY): Payer: Self-pay

## 2022-07-26 DIAGNOSIS — E43 Unspecified severe protein-calorie malnutrition: Secondary | ICD-10-CM | POA: Diagnosis not present

## 2022-07-26 DIAGNOSIS — K5730 Diverticulosis of large intestine without perforation or abscess without bleeding: Secondary | ICD-10-CM | POA: Diagnosis not present

## 2022-07-26 DIAGNOSIS — R9431 Abnormal electrocardiogram [ECG] [EKG]: Secondary | ICD-10-CM | POA: Diagnosis not present

## 2022-07-26 DIAGNOSIS — M25551 Pain in right hip: Secondary | ICD-10-CM | POA: Diagnosis not present

## 2022-07-26 DIAGNOSIS — R319 Hematuria, unspecified: Secondary | ICD-10-CM | POA: Insufficient documentation

## 2022-07-26 DIAGNOSIS — K8689 Other specified diseases of pancreas: Secondary | ICD-10-CM

## 2022-07-26 DIAGNOSIS — R1312 Dysphagia, oropharyngeal phase: Secondary | ICD-10-CM | POA: Diagnosis not present

## 2022-07-26 DIAGNOSIS — F339 Major depressive disorder, recurrent, unspecified: Secondary | ICD-10-CM | POA: Diagnosis not present

## 2022-07-26 DIAGNOSIS — M25572 Pain in left ankle and joints of left foot: Secondary | ICD-10-CM | POA: Diagnosis not present

## 2022-07-26 DIAGNOSIS — K869 Disease of pancreas, unspecified: Secondary | ICD-10-CM | POA: Diagnosis not present

## 2022-07-26 DIAGNOSIS — R531 Weakness: Secondary | ICD-10-CM | POA: Insufficient documentation

## 2022-07-26 DIAGNOSIS — R2681 Unsteadiness on feet: Secondary | ICD-10-CM | POA: Diagnosis not present

## 2022-07-26 DIAGNOSIS — S72011D Unspecified intracapsular fracture of right femur, subsequent encounter for closed fracture with routine healing: Secondary | ICD-10-CM | POA: Diagnosis not present

## 2022-07-26 DIAGNOSIS — R4182 Altered mental status, unspecified: Secondary | ICD-10-CM | POA: Diagnosis not present

## 2022-07-26 DIAGNOSIS — G20C Parkinsonism, unspecified: Secondary | ICD-10-CM | POA: Insufficient documentation

## 2022-07-26 DIAGNOSIS — I714 Abdominal aortic aneurysm, without rupture, unspecified: Secondary | ICD-10-CM | POA: Diagnosis not present

## 2022-07-26 DIAGNOSIS — G20A1 Parkinson's disease without dyskinesia, without mention of fluctuations: Secondary | ICD-10-CM | POA: Diagnosis not present

## 2022-07-26 DIAGNOSIS — N401 Enlarged prostate with lower urinary tract symptoms: Secondary | ICD-10-CM | POA: Diagnosis not present

## 2022-07-26 DIAGNOSIS — K449 Diaphragmatic hernia without obstruction or gangrene: Secondary | ICD-10-CM | POA: Diagnosis not present

## 2022-07-26 DIAGNOSIS — R338 Other retention of urine: Secondary | ICD-10-CM | POA: Diagnosis not present

## 2022-07-26 DIAGNOSIS — R41841 Cognitive communication deficit: Secondary | ICD-10-CM | POA: Diagnosis not present

## 2022-07-26 LAB — COMPREHENSIVE METABOLIC PANEL
ALT: <5 U/L (ref 0–44)
AST: 16 U/L (ref 15–41)
Albumin: 3.5 g/dL (ref 3.5–5.0)
Alkaline Phosphatase: 102 U/L (ref 38–126)
Anion gap: 7 (ref 5–15)
BUN: 16 mg/dL (ref 8–23)
CO2: 26 mmol/L (ref 22–32)
Calcium: 8.9 mg/dL (ref 8.9–10.3)
Chloride: 106 mmol/L (ref 98–111)
Creatinine, Ser: 1.1 mg/dL (ref 0.61–1.24)
GFR, Estimated: >60 mL/min (ref >60)
Glucose, Bld: 74 mg/dL (ref 70–99)
Potassium: 4 mmol/L (ref 3.5–5.1)
Sodium: 139 mmol/L (ref 135–145)
Total Bilirubin: 0.8 mg/dL (ref 0.3–1.2)
Total Protein: 6 g/dL — ABNORMAL LOW (ref 6.5–8.1)

## 2022-07-26 LAB — URINALYSIS, ROUTINE W REFLEX MICROSCOPIC
Bilirubin Urine: NEGATIVE
Glucose, UA: NEGATIVE mg/dL
Ketones, ur: 5 mg/dL — AB
Leukocytes,Ua: NEGATIVE
Nitrite: NEGATIVE
Protein, ur: NEGATIVE mg/dL
Specific Gravity, Urine: 1.013 (ref 1.005–1.030)
pH: 5 (ref 5.0–8.0)

## 2022-07-26 LAB — CBC
HCT: 38.3 % — ABNORMAL LOW (ref 39.0–52.0)
Hemoglobin: 12.3 g/dL — ABNORMAL LOW (ref 13.0–17.0)
MCH: 31.1 pg (ref 26.0–34.0)
MCHC: 32.1 g/dL (ref 30.0–36.0)
MCV: 96.7 fL (ref 80.0–100.0)
Platelets: 195 10*3/uL (ref 150–400)
RBC: 3.96 MIL/uL — ABNORMAL LOW (ref 4.22–5.81)
RDW: 13.6 % (ref 11.5–15.5)
WBC: 8.9 10*3/uL (ref 4.0–10.5)
nRBC: 0 % (ref 0.0–0.2)

## 2022-07-26 MED ORDER — SODIUM CHLORIDE 0.9 % IV BOLUS
500.0000 mL | Freq: Once | INTRAVENOUS | Status: AC
  Administered 2022-07-26: 500 mL via INTRAVENOUS

## 2022-07-26 MED ORDER — IOHEXOL 300 MG/ML  SOLN
100.0000 mL | Freq: Once | INTRAMUSCULAR | Status: AC | PRN
  Administered 2022-07-26: 100 mL via INTRAVENOUS

## 2022-07-26 NOTE — Discharge Instructions (Addendum)
Your workup today was reassuring however you were found to have a mass on your pancreas which was nonacute finding is likely been there for some time he will need to have further evaluation with an MRI in the outpatient basis.  Please follow-up with your primary care doctor.  Return to the emergency room if needed for any new or concerning symptoms.  I am glad you are feeling so well today.  Remember to hydrate and eat plenty of food.  You also were noted to have a small amount of blood in your urine please have this rechecked with your primary care doctor as well

## 2022-07-26 NOTE — ED Provider Notes (Signed)
[place] [facility] DEPARTMENT AT [facility] Provider Note   CSN: [id number] Arrival date & time: 07/26/22  1045     History {Add pertinent medical, surgical, social history, OB history to HPI:1} Chief Complaint  Patient presents with   Altered Mental Status    Lee Martinez is a 87 y.o. male This is an 87 year old male who presents with concern for altered mental status from rehab facility where he is being seen for generalized weakness.  The report from ambulance was that he was having some slurred speech.  No other neurologic deficits were noted.  I attempted to obtain collateral from his rehab facility and was unable to speak to anyone on the phone.  On my exam patient denies any pain, nausea, vomiting, weakness, dizziness, chest pain, shortness of breath.  He reports that he overall feels normal.  He does have a history of Parkinson's.  He reports he has been taking his medication   Altered Mental Status      Home Medications Prior to Admission medications   Medication Sig Start Date End Date Taking? Authorizing Provider  acetaminophen (TYLENOL) 500 MG tablet Take 1 tablet (500 mg total) by mouth every 6 (six) hours as needed. 02/16/22   Jeanell Sparrow, DO  atorvastatin (LIPITOR) 10 MG tablet Take 1 tab daily in evening MWF 02/07/22   [name], FNP  carbidopa-levodopa (SINEMET IR) 25-100 MG tablet Take 3 tablets by mouth 3 (three) times daily. 05/28/22   Shalhoub, Sherryll Burger, MD  docusate sodium (COLACE) 100 MG capsule Take 1 capsule (100 mg total) by mouth 2 (two) times daily. 05/28/22   Shalhoub, Sherryll Burger, MD  ferrous sulfate 325 (65 FE) MG tablet Take 1 tablet (325 mg total) by mouth daily before breakfast. Take with a cup of orange juice or juice if possible 05/29/22   Shalhoub, Sherryll Burger, MD  HYDROcodone-acetaminophen (NORCO/VICODIN) 5-325 MG tablet Take 1 tablet by mouth every 6 (six) hours as needed for severe pain. 05/28/22   Shalhoub, Sherryll Burger, MD  lactose free  nutrition (BOOST PLUS) LIQD Take 237 mLs by mouth 3 (three) times daily between meals. 05/28/22   Shalhoub, Sherryll Burger, MD  methocarbamol (ROBAXIN) 500 MG tablet Take 1 tablet (500 mg total) by mouth every 6 (six) hours as needed for muscle spasms. 05/22/22   Irving Copas, PA-C  multivitamin (PROSIGHT) TABS tablet Take 1 tablet by mouth daily. 05/29/22   Shalhoub, Sherryll Burger, MD  ondansetron (ZOFRAN) 4 MG tablet Take 1 tablet (4 mg total) by mouth every 6 (six) hours as needed for nausea. 05/28/22   Shalhoub, Sherryll Burger, MD  polyethylene glycol (MIRALAX / GLYCOLAX) 17 g packet Take 17 g by mouth daily. Do not give if patient is experiencing loose stools, diarrhea or frequent stools that day 05/28/22   Vernelle Emerald, MD  sertraline (ZOLOFT) 50 MG tablet Take 1 tablet (50 mg total) by mouth daily. 02/07/22 02/07/23  [name], FNP  tamsulosin (FLOMAX) 0.4 MG CAPS capsule Take 1 capsule (0.4 mg total) by mouth daily. 05/29/22   Shalhoub, Sherryll Burger, MD      Allergies    Patient has no known allergies.    Review of Systems   Review of Systems  All other systems reviewed and are negative.   Physical Exam Updated Vital Signs BP (!) 92/57   Pulse 68   Temp (!) 97.3 F (36.3 C) (Oral)   Resp 18   Ht 5\' 11"  (1.803  m)   Wt 64.5 kg   SpO2 96%   BMI 19.83 kg/m  Physical Exam Vitals and nursing note reviewed.  Constitutional:      General: He is not in acute distress.    Appearance: Normal appearance.  HENT:     Head: Normocephalic and atraumatic.  Eyes:     General:        Right eye: No discharge.        Left eye: No discharge.  Cardiovascular:     Rate and Rhythm: Normal rate and regular rhythm.     Heart sounds: No murmur heard.    No friction rub. No gallop.  Pulmonary:     Effort: Pulmonary effort is normal.     Breath sounds: Normal breath sounds.  Abdominal:     General: Bowel sounds are normal.     Palpations: Abdomen is soft.  Skin:    General: Skin is warm and dry.      Capillary Refill: Capillary refill takes less than 2 seconds.  Neurological:     Mental Status: He is alert and oriented to person, place, and time.     Comments: Cranial nerves II through XII grossly intact.  Intact finger-nose, intact heel-to-shin.  Romberg negative, gait normal.  Alert and oriented x3.  Moves all 4 limbs spontaneously, normal coordination.  No pronator drift.  Intact strength 5 out of 5 bilateral upper and lower extremities.    Psychiatric:        Mood and Affect: Mood normal.        Behavior: Behavior normal.     ED Results / Procedures / Treatments   Labs (all labs ordered are listed, but only abnormal results are displayed) Labs Reviewed - No data to display  EKG None  Radiology No results found.  Procedures Procedures  {Document cardiac monitor, telemetry assessment procedure when appropriate:1}  Medications Ordered in ED Medications  sodium chloride 0.9 % bolus 500 mL (has no administration in time range)    ED Course/ Medical Decision Making/ A&P Clinical Course as of 07/26/22 1506  Fri Jul 26, 2022  1500 Patient was hesitant to have abdomen palpated, somewhat guarded, unclear whether any intraabdominal pathology. Will obtain CT abd pelvis prior to DC. [CP]    Clinical Course User Index [CP] Leilan Bochenek H, PA-C   {   Click here for ABCD2, HEART and other calculatorsREFRESH Note before signing :1}                          Medical Decision Making Amount and/or Complexity of Data Reviewed Labs: ordered. Radiology: ordered.   This patient is a 87 y.o. male who presents to the ED for concern of ***, this involves an extensive number of treatment options, and is a complaint that carries with it a high risk of complications and morbidity. The emergent differential diagnosis prior to evaluation includes, but is not limited to,  *** . This is not an exhaustive differential.   Past Medical History / Co-morbidities / Social  History: ***  Additional history: Chart reviewed. Pertinent results include: ***  Physical Exam: Physical exam performed. The pertinent findings include: ***  Lab Tests: I ordered, and personally interpreted labs.  The pertinent results include:  ***   Imaging Studies: I ordered imaging studies including ***. I independently visualized and interpreted imaging which showed ***. I agree with the radiologist interpretation.   Cardiac Monitoring:  The patient was maintained  on a cardiac monitor.  My attending physician Dr. Marland Kitchen viewed and interpreted the cardiac monitored which showed an underlying rhythm of: ***. I agree with this interpretation.   Medications: I ordered medication including ***  for ***. Reevaluation of the patient after these medicines showed that the patient {resolved/improved/worsened:23923::"improved"}. I have reviewed the patients home medicines and have made adjustments as needed.  Consultations Obtained: I requested consultation with the ***,  and discussed lab and imaging findings as well as pertinent plan - they recommend: ***   Disposition: After consideration of the diagnostic results and the patients response to treatment, I feel that *** .   ***emergency department workup does not suggest an emergent condition requiring admission or immediate intervention beyond what has been performed at this time. The plan is: ***. The patient is safe for discharge and has been instructed to return immediately for worsening symptoms, change in symptoms or any other concerns.  I discussed this case with my attending physician Dr. Marland Kitchen who cosigned this note including patient's presenting symptoms, physical exam, and planned diagnostics and interventions. Attending physician stated agreement with plan or made changes to plan which were implemented.    Final Clinical Impression(s) / ED Diagnoses Final diagnoses:  None    Rx / DC Orders ED Discharge Orders     None

## 2022-07-26 NOTE — ED Notes (Signed)
Patient transported to CT 

## 2022-07-26 NOTE — ED Notes (Signed)
[facility] called  

## 2022-07-26 NOTE — ED Notes (Signed)
[facility] here to transport patient back to facility

## 2022-07-26 NOTE — ED Provider Notes (Signed)
     Physical Exam  BP 131/76   Pulse 67   Temp (!) 97.5 F (36.4 C) (Oral)   Resp 10   Ht 5\' 11"  (1.803 m)   Wt 64.5 kg   SpO2 96%   BMI 19.83 kg/m   Physical Exam Vitals and nursing note reviewed.  Constitutional:      General: He is not in acute distress.    Comments: Pleasant cachectic 87 year old male in no acute distress.  HENT:     Head: Normocephalic and atraumatic.     Nose: Nose normal.  Eyes:     General: No scleral icterus. Cardiovascular:     Rate and Rhythm: Normal rate and regular rhythm.     Pulses: Normal pulses.     Heart sounds: Normal heart sounds.  Pulmonary:     Effort: Pulmonary effort is normal. No respiratory distress.     Breath sounds: No wheezing.  Abdominal:     Palpations: Abdomen is soft.     Tenderness: There is no abdominal tenderness. There is no guarding or rebound.     Comments: Abdomen soft nontender  Musculoskeletal:     Cervical back: Normal range of motion.     Right lower leg: No edema.     Left lower leg: No edema.  Skin:    General: Skin is warm and dry.     Capillary Refill: Capillary refill takes less than 2 seconds.  Neurological:     Mental Status: He is alert. Mental status is at baseline.  Psychiatric:        Mood and Affect: Mood normal.        Behavior: Behavior normal.     Procedures  Procedures  ED Course / MDM   Clinical Course as of 07/26/22 1731  Fri Jul 26, 2022  1500 Patient was hesitant to have abdomen palpated, somewhat guarded, unclear whether any intraabdominal pathology. Will obtain CT abd pelvis prior to DC. [CP]    Clinical Course User Index [CP] Anselmo Pickler, PA-C   Medical Decision Making Amount and/or Complexity of Data Reviewed Labs: ordered. Radiology: ordered.  Risk Prescription drug management.   Patient brought in by EMS for altered mental status from rehab facility.  Seems that generalized weakness was more of his specific symptoms.  He had a thorough evaluation  here in the emergency room.  He did have a CT abdomen pelvis obtained because he was hesitant to have his abdomen palpated earlier.  On my reevaluation he seems to be much improved from a mood standpoint.  He allows me to palpate his abdomen which is nontender.  CT abdomen pelvis was found to show an incidental pancreatic mass.  This will need to be evaluated by MRI in the outpatient basis.  I discussed this with him and placed a diagnosis of pancreatic mass in his chart and provided specific discharge instructions for this.  Also noted to have some hematuria he has no urinary symptoms per patient no evidence of infection on urinalysis will need to have repeat UA done with PCP as well.  Patient agreeable to plan.  Patient is answering my questions, he is following my commands and is oriented.  He tells me that the nursing home staff sent to the ER because he was a bit unsteady on his feet earlier.  He feels that he was dehydrated     Tedd Sias, [place] 07/26/22 1738    Tegeler, Gwenyth Allegra, MD 07/27/22 [phone]

## 2022-07-26 NOTE — ED Triage Notes (Signed)
From [facility] rehab. Staff reports slur speech, AMS this morning. Aox4 currently. Pt c/o positional hip pain, no recent falls/trauma.

## 2022-07-29 DIAGNOSIS — E43 Unspecified severe protein-calorie malnutrition: Secondary | ICD-10-CM | POA: Diagnosis not present

## 2022-07-29 DIAGNOSIS — R41841 Cognitive communication deficit: Secondary | ICD-10-CM | POA: Diagnosis not present

## 2022-07-29 DIAGNOSIS — F339 Major depressive disorder, recurrent, unspecified: Secondary | ICD-10-CM | POA: Diagnosis not present

## 2022-07-29 DIAGNOSIS — G20A1 Parkinson's disease without dyskinesia, without mention of fluctuations: Secondary | ICD-10-CM | POA: Diagnosis not present

## 2022-07-29 DIAGNOSIS — S72011D Unspecified intracapsular fracture of right femur, subsequent encounter for closed fracture with routine healing: Secondary | ICD-10-CM | POA: Diagnosis not present

## 2022-07-29 DIAGNOSIS — I714 Abdominal aortic aneurysm, without rupture, unspecified: Secondary | ICD-10-CM | POA: Diagnosis not present

## 2022-07-29 DIAGNOSIS — N401 Enlarged prostate with lower urinary tract symptoms: Secondary | ICD-10-CM | POA: Diagnosis not present

## 2022-07-29 DIAGNOSIS — R338 Other retention of urine: Secondary | ICD-10-CM | POA: Diagnosis not present

## 2022-07-29 DIAGNOSIS — R1312 Dysphagia, oropharyngeal phase: Secondary | ICD-10-CM | POA: Diagnosis not present

## 2022-07-30 ENCOUNTER — Telehealth: Payer: Self-pay

## 2022-07-30 DIAGNOSIS — G20B1 Parkinson's disease with dyskinesia, without mention of fluctuations: Secondary | ICD-10-CM | POA: Diagnosis not present

## 2022-07-30 DIAGNOSIS — G3183 Dementia with Lewy bodies: Secondary | ICD-10-CM | POA: Diagnosis not present

## 2022-07-30 DIAGNOSIS — N401 Enlarged prostate with lower urinary tract symptoms: Secondary | ICD-10-CM | POA: Diagnosis not present

## 2022-07-30 DIAGNOSIS — F331 Major depressive disorder, recurrent, moderate: Secondary | ICD-10-CM | POA: Diagnosis not present

## 2022-07-30 NOTE — Telephone Encounter (Signed)
        Patient  visited [facility] on 3/15   Telephone encounter attempt :   1st  A HIPAA compliant voice message was left requesting a return call.  Instructed patient to call back .    [facility] [phone] [address], [place], [place] [address] Phone: [phone] Email: [email or web address]

## 2022-07-31 ENCOUNTER — Telehealth: Payer: Self-pay

## 2022-07-31 DIAGNOSIS — I714 Abdominal aortic aneurysm, without rupture, unspecified: Secondary | ICD-10-CM | POA: Diagnosis not present

## 2022-07-31 DIAGNOSIS — R1312 Dysphagia, oropharyngeal phase: Secondary | ICD-10-CM | POA: Diagnosis not present

## 2022-07-31 DIAGNOSIS — G20A1 Parkinson's disease without dyskinesia, without mention of fluctuations: Secondary | ICD-10-CM | POA: Diagnosis not present

## 2022-07-31 DIAGNOSIS — F339 Major depressive disorder, recurrent, unspecified: Secondary | ICD-10-CM | POA: Diagnosis not present

## 2022-07-31 DIAGNOSIS — R338 Other retention of urine: Secondary | ICD-10-CM | POA: Diagnosis not present

## 2022-07-31 DIAGNOSIS — E43 Unspecified severe protein-calorie malnutrition: Secondary | ICD-10-CM | POA: Diagnosis not present

## 2022-07-31 DIAGNOSIS — N401 Enlarged prostate with lower urinary tract symptoms: Secondary | ICD-10-CM | POA: Diagnosis not present

## 2022-07-31 DIAGNOSIS — S72011D Unspecified intracapsular fracture of right femur, subsequent encounter for closed fracture with routine healing: Secondary | ICD-10-CM | POA: Diagnosis not present

## 2022-07-31 DIAGNOSIS — R41841 Cognitive communication deficit: Secondary | ICD-10-CM | POA: Diagnosis not present

## 2022-07-31 NOTE — Telephone Encounter (Signed)
        Patient  visited [facility] on 3/15     Telephone encounter attempt : 2nd   A HIPAA compliant voice message was left requesting a return call.  Instructed patient to call back.    [facility] [phone] [address], [place], [place] [address] Phone: [phone] Email: [email or web address]

## 2022-08-01 ENCOUNTER — Telehealth: Payer: Self-pay

## 2022-08-01 NOTE — Telephone Encounter (Signed)
        Patient  visited [facility] on 3/15  for    Telephone encounter attempt :  3rd  A HIPAA compliant voice message was left requesting a return call.  Instructed patient to call back .   [facility] [phone] [address], [place], [place] [address] Phone: [phone] Email: [email or web address]

## 2022-08-02 DIAGNOSIS — S72091D Other fracture of head and neck of right femur, subsequent encounter for closed fracture with routine healing: Secondary | ICD-10-CM | POA: Diagnosis not present

## 2022-08-02 DIAGNOSIS — I714 Abdominal aortic aneurysm, without rupture, unspecified: Secondary | ICD-10-CM | POA: Diagnosis not present

## 2022-08-02 DIAGNOSIS — G20A1 Parkinson's disease without dyskinesia, without mention of fluctuations: Secondary | ICD-10-CM | POA: Diagnosis not present

## 2022-08-02 DIAGNOSIS — Z9181 History of falling: Secondary | ICD-10-CM | POA: Diagnosis not present

## 2022-08-02 DIAGNOSIS — R1312 Dysphagia, oropharyngeal phase: Secondary | ICD-10-CM | POA: Diagnosis not present

## 2022-08-02 DIAGNOSIS — S72011D Unspecified intracapsular fracture of right femur, subsequent encounter for closed fracture with routine healing: Secondary | ICD-10-CM | POA: Diagnosis not present

## 2022-08-02 DIAGNOSIS — M6281 Muscle weakness (generalized): Secondary | ICD-10-CM | POA: Diagnosis not present

## 2022-08-09 DIAGNOSIS — R338 Other retention of urine: Secondary | ICD-10-CM | POA: Diagnosis not present

## 2022-08-09 DIAGNOSIS — F339 Major depressive disorder, recurrent, unspecified: Secondary | ICD-10-CM | POA: Diagnosis not present

## 2022-08-09 DIAGNOSIS — G20A1 Parkinson's disease without dyskinesia, without mention of fluctuations: Secondary | ICD-10-CM | POA: Diagnosis not present

## 2022-08-09 DIAGNOSIS — R41841 Cognitive communication deficit: Secondary | ICD-10-CM | POA: Diagnosis not present

## 2022-08-09 DIAGNOSIS — I714 Abdominal aortic aneurysm, without rupture, unspecified: Secondary | ICD-10-CM | POA: Diagnosis not present

## 2022-08-09 DIAGNOSIS — E43 Unspecified severe protein-calorie malnutrition: Secondary | ICD-10-CM | POA: Diagnosis not present

## 2022-08-09 DIAGNOSIS — R1312 Dysphagia, oropharyngeal phase: Secondary | ICD-10-CM | POA: Diagnosis not present

## 2022-08-09 DIAGNOSIS — N401 Enlarged prostate with lower urinary tract symptoms: Secondary | ICD-10-CM | POA: Diagnosis not present

## 2022-08-09 DIAGNOSIS — S72011D Unspecified intracapsular fracture of right femur, subsequent encounter for closed fracture with routine healing: Secondary | ICD-10-CM | POA: Diagnosis not present

## 2022-08-13 DIAGNOSIS — N401 Enlarged prostate with lower urinary tract symptoms: Secondary | ICD-10-CM | POA: Diagnosis not present

## 2022-08-13 DIAGNOSIS — R1312 Dysphagia, oropharyngeal phase: Secondary | ICD-10-CM | POA: Diagnosis not present

## 2022-08-13 DIAGNOSIS — E43 Unspecified severe protein-calorie malnutrition: Secondary | ICD-10-CM | POA: Diagnosis not present

## 2022-08-13 DIAGNOSIS — F339 Major depressive disorder, recurrent, unspecified: Secondary | ICD-10-CM | POA: Diagnosis not present

## 2022-08-13 DIAGNOSIS — S72011D Unspecified intracapsular fracture of right femur, subsequent encounter for closed fracture with routine healing: Secondary | ICD-10-CM | POA: Diagnosis not present

## 2022-08-13 DIAGNOSIS — R41841 Cognitive communication deficit: Secondary | ICD-10-CM | POA: Diagnosis not present

## 2022-08-13 DIAGNOSIS — R338 Other retention of urine: Secondary | ICD-10-CM | POA: Diagnosis not present

## 2022-08-13 DIAGNOSIS — I714 Abdominal aortic aneurysm, without rupture, unspecified: Secondary | ICD-10-CM | POA: Diagnosis not present

## 2022-08-13 DIAGNOSIS — G20A1 Parkinson's disease without dyskinesia, without mention of fluctuations: Secondary | ICD-10-CM | POA: Diagnosis not present

## 2022-08-18 ENCOUNTER — Emergency Department (HOSPITAL_COMMUNITY)
Admission: EM | Admit: 2022-08-18 | Discharge: 2022-08-19 | Disposition: A | Discharge status: Skilled Nursing Facility | Payer: Medicare HMO | Attending: Emergency Medicine | Admitting: Emergency Medicine

## 2022-08-18 ENCOUNTER — Emergency Department (HOSPITAL_COMMUNITY): Payer: Medicare HMO

## 2022-08-18 ENCOUNTER — Other Ambulatory Visit: Payer: Self-pay

## 2022-08-18 DIAGNOSIS — W19XXXA Unspecified fall, initial encounter: Secondary | ICD-10-CM

## 2022-08-18 DIAGNOSIS — W010XXA Fall on same level from slipping, tripping and stumbling without subsequent striking against object, initial encounter: Secondary | ICD-10-CM | POA: Diagnosis not present

## 2022-08-18 DIAGNOSIS — R0781 Pleurodynia: Secondary | ICD-10-CM | POA: Diagnosis not present

## 2022-08-18 DIAGNOSIS — S4992XA Unspecified injury of left shoulder and upper arm, initial encounter: Secondary | ICD-10-CM | POA: Diagnosis not present

## 2022-08-18 DIAGNOSIS — S199XXA Unspecified injury of neck, initial encounter: Secondary | ICD-10-CM | POA: Diagnosis not present

## 2022-08-18 DIAGNOSIS — M79622 Pain in left upper arm: Secondary | ICD-10-CM | POA: Insufficient documentation

## 2022-08-18 DIAGNOSIS — M546 Pain in thoracic spine: Secondary | ICD-10-CM | POA: Insufficient documentation

## 2022-08-18 DIAGNOSIS — G20C Parkinsonism, unspecified: Secondary | ICD-10-CM | POA: Insufficient documentation

## 2022-08-18 DIAGNOSIS — I499 Cardiac arrhythmia, unspecified: Secondary | ICD-10-CM | POA: Diagnosis not present

## 2022-08-18 DIAGNOSIS — M545 Low back pain, unspecified: Secondary | ICD-10-CM | POA: Diagnosis not present

## 2022-08-18 DIAGNOSIS — R0902 Hypoxemia: Secondary | ICD-10-CM | POA: Diagnosis not present

## 2022-08-18 DIAGNOSIS — R6889 Other general symptoms and signs: Secondary | ICD-10-CM | POA: Diagnosis not present

## 2022-08-18 DIAGNOSIS — R0789 Other chest pain: Secondary | ICD-10-CM | POA: Diagnosis not present

## 2022-08-18 DIAGNOSIS — Z743 Need for continuous supervision: Secondary | ICD-10-CM | POA: Diagnosis not present

## 2022-08-18 DIAGNOSIS — I6789 Other cerebrovascular disease: Secondary | ICD-10-CM | POA: Diagnosis not present

## 2022-08-18 DIAGNOSIS — M25512 Pain in left shoulder: Secondary | ICD-10-CM | POA: Diagnosis not present

## 2022-08-18 LAB — BASIC METABOLIC PANEL
Anion gap: 8 (ref 5–15)
BUN: 29 mg/dL — ABNORMAL HIGH (ref 8–23)
CO2: 25 mmol/L (ref 22–32)
Calcium: 8.5 mg/dL — ABNORMAL LOW (ref 8.9–10.3)
Chloride: 106 mmol/L (ref 98–111)
Creatinine, Ser: 1.34 mg/dL — ABNORMAL HIGH (ref 0.61–1.24)
GFR, Estimated: 52 mL/min — ABNORMAL LOW (ref >60)
Glucose, Bld: 118 mg/dL — ABNORMAL HIGH (ref 70–99)
Potassium: 3.6 mmol/L (ref 3.5–5.1)
Sodium: 139 mmol/L (ref 135–145)

## 2022-08-18 LAB — CBC
HCT: 37.7 % — ABNORMAL LOW (ref 39.0–52.0)
Hemoglobin: 11.9 g/dL — ABNORMAL LOW (ref 13.0–17.0)
MCH: 30.4 pg (ref 26.0–34.0)
MCHC: 31.6 g/dL (ref 30.0–36.0)
MCV: 96.4 fL (ref 80.0–100.0)
Platelets: 195 10*3/uL (ref 150–400)
RBC: 3.91 MIL/uL — ABNORMAL LOW (ref 4.22–5.81)
RDW: 14 % (ref 11.5–15.5)
WBC: 9.8 10*3/uL (ref 4.0–10.5)
nRBC: 0 % (ref 0.0–0.2)

## 2022-08-18 MED ORDER — LIDOCAINE 5 % EX PTCH: 1.0000 | MEDICATED_PATCH | CUTANEOUS | 0 refills | Status: DC

## 2022-08-18 MED ORDER — MORPHINE SULFATE (PF) 4 MG/ML IV SOLN
4.0000 mg | Freq: Once | INTRAVENOUS | Status: AC
  Administered 2022-08-18: 4 mg via INTRAVENOUS
  Filled 2022-08-18: qty 1

## 2022-08-18 NOTE — ED Provider Notes (Signed)
[place] [facility] Provider [facility]   CSN: [id number] Arrival date & time: 08/18/22  1955     History {Add pertinent medical, surgical, social history, OB history to HPI:1} Chief Complaint  Patient presents with   Lee LeaFall    Lee Martinez is a 87 y.o. male.   Fall Pertinent negatives include no chest pain and no shortness of breath.     Patient has a history of Parkinson's disease, depression, BPH, anemia, protein calorie malnutrition, metabolic encephalopathy, pneumonia who presents to the ED for evaluation of pain after a fall.  Patient states he tripped and fell yesterday.  He does not think he lost consciousness.  Since that time he has been having pain in his back and rib area.  He is also having pain in his left upper arm.  He states they have been giving him medications for pain at his senior living facility for is not sure what specifically.  The pain increases.  At times it is very sharp.  It increases with movement.  Patient states he has been able to get up and walk since the fall.  Home Medications Prior to Admission medications   Medication Sig Start Date End Date Taking? Authorizing Provider  lidocaine (LIDODERM) 5 % Place 1 patch onto the skin daily. Remove & Discard patch within 12 hours or as directed by MD 08/18/22  Yes Linwood DibblesKnapp, Parminder Trapani, MD  acetaminophen (TYLENOL) 500 MG tablet Take 1 tablet (500 mg total) by mouth every 6 (six) hours as needed. 02/16/22   Sloan LeiterGray, Samuel A, DO  ARIPiprazole (ABILIFY) 2 MG tablet Take 2 mg by mouth daily. 07/09/22   [provider]  atorvastatin (LIPITOR) 10 MG tablet Take 1 tab daily in evening MWF Patient taking differently: Take 10 mg by mouth. Monday Wednesday Friday 02/07/22   Arnette FeltsMoore, Janece, FNP  carbidopa-levodopa (SINEMET IR) 25-100 MG tablet Take 3 tablets by mouth 3 (three) times daily. 05/28/22   Shalhoub, Deno LungerGeorge J, MD  docusate sodium (COLACE) 100 MG capsule Take 1 capsule (100 mg total) by mouth  2 (two) times daily. 05/28/22   Shalhoub, Deno LungerGeorge J, MD  ferrous sulfate 325 (65 FE) MG tablet Take 1 tablet (325 mg total) by mouth daily before breakfast. Take with a cup of orange juice or juice if possible 05/29/22   Shalhoub, Deno LungerGeorge J, MD  lactose free nutrition (BOOST PLUS) LIQD Take 237 mLs by mouth 3 (three) times daily between meals. 05/28/22   Shalhoub, Deno LungerGeorge J, MD  methocarbamol (ROBAXIN) 500 MG tablet Take 1 tablet (500 mg total) by mouth every 6 (six) hours as needed for muscle spasms. 05/22/22   Cassandria Angeronovan, Ashley R, PA-C  multivitamin (PROSIGHT) TABS tablet Take 1 tablet by mouth daily. 05/29/22   Shalhoub, Deno LungerGeorge J, MD  ondansetron (ZOFRAN) 4 MG tablet Take 1 tablet (4 mg total) by mouth every 6 (six) hours as needed for nausea. 05/28/22   Shalhoub, Deno LungerGeorge J, MD  polyethylene glycol (MIRALAX / GLYCOLAX) 17 g packet Take 17 g by mouth daily. Do not give if patient is experiencing loose stools, diarrhea or frequent stools that day 05/28/22   Marinda ElkShalhoub, George J, MD  sertraline (ZOLOFT) 50 MG tablet Take 1 tablet (50 mg total) by mouth daily. 02/07/22 02/07/23  Arnette FeltsMoore, Janece, FNP  tamsulosin (FLOMAX) 0.4 MG CAPS capsule Take 1 capsule (0.4 mg total) by mouth daily. 05/29/22   Shalhoub, Deno LungerGeorge J, MD      Allergies    Patient  has no known allergies.    Review of Systems   Review of Systems  Constitutional:  Negative for fever.  Respiratory:  Negative for shortness of breath.   Cardiovascular:  Negative for chest pain.    Physical Exam Updated Vital Signs BP 111/74 (BP Location: Right Arm)   Pulse 82   Temp (!) 97.5 F (36.4 C) (Oral)   Resp 16   SpO2 96%  Physical Exam Vitals and nursing note reviewed.  Constitutional:      Appearance: He is well-developed. He is not diaphoretic.     Comments: Elderly, frail  HENT:     Head: Normocephalic and atraumatic.     Right Ear: External ear normal.     Left Ear: External ear normal.  Eyes:     General: No scleral icterus.       Right eye:  No discharge.        Left eye: No discharge.     Conjunctiva/sclera: Conjunctivae normal.  Neck:     Trachea: No tracheal deviation.  Cardiovascular:     Rate and Rhythm: Normal rate and regular rhythm.  Pulmonary:     Effort: Pulmonary effort is normal. No respiratory distress.     Breath sounds: Normal breath sounds. No stridor. No wheezing or rales.  Abdominal:     General: Bowel sounds are normal. There is no distension.     Palpations: Abdomen is soft.     Tenderness: There is no abdominal tenderness. There is no guarding or rebound.  Musculoskeletal:        General: No deformity.     Left shoulder: Tenderness present. No swelling or deformity.     Left upper arm: Tenderness present. No swelling or deformity.     Cervical back: Neck supple. Tenderness present. Pain with movement present.     Thoracic back: Tenderness present.     Lumbar back: Tenderness present.     Comments: No tenderness palpation bilateral lower extremities, no tenderness palpation right upper extremity  Skin:    General: Skin is warm and dry.     Findings: No rash.  Neurological:     General: No focal deficit present.     Mental Status: He is alert.     Cranial Nerves: No cranial nerve deficit, dysarthria or facial asymmetry.     Sensory: No sensory deficit.     Motor: No abnormal muscle tone or seizure activity.     Coordination: Coordination normal.  Psychiatric:        Mood and Affect: Mood normal.     ED Results / Procedures / Treatments   Labs (all labs ordered are listed, but only abnormal results are displayed) Labs Reviewed  CBC - Abnormal; Notable for the following components:      Result Value   RBC 3.91 (*)    Hemoglobin 11.9 (*)    HCT 37.7 (*)    All other components within normal limits  BASIC METABOLIC PANEL - Abnormal; Notable for the following components:   Glucose, Bld 118 (*)    BUN 29 (*)    Creatinine, Ser 1.34 (*)    Calcium 8.5 (*)    GFR, Estimated 52 (*)    All  other components within normal limits    EKG EKG Interpretation  Date/Time:  Sunday August 18 2022 21:15:59 EDT Ventricular Rate:  85 PR Interval:    QRS Duration: 100 QT Interval:  391 QTC Calculation: 465 R Axis:   -32 Text Interpretation:  Sinus rhythm Inferior infarct, old Lateral leads are also involved No significant change since last tracing Confirmed by Linwood Dibbles [phone]) on 08/18/2022 9:26:50 PM  Radiology CT Cervical Spine Wo Contrast  Result Date: 08/18/2022 CLINICAL DATA:  Status post trauma. EXAM: CT CERVICAL SPINE WITHOUT CONTRAST TECHNIQUE: Multidetector CT imaging of the cervical spine was performed without intravenous contrast. Multiplanar CT image reconstructions were also generated. RADIATION DOSE REDUCTION: This exam was performed according to the departmental dose-optimization program which includes automated exposure control, adjustment of the mA and/or kV according to patient size and/or use of iterative reconstruction technique. COMPARISON:  June 23, 2022 FINDINGS: Alignment: There is stable 1 mm to 2 mm anterolisthesis of the C7 vertebral body on T1. Skull base and vertebrae: No acute fracture. No primary bone lesion or focal pathologic process. Soft tissues and spinal canal: No prevertebral fluid or swelling. No visible canal hematoma. Disc levels: Moderate to marked severity endplate sclerosis is seen throughout all levels of the cervical spine. Mild to moderate severity anterior osteophyte formation and marked severity posterior bone spurring are also seen at the levels of C5-C6 and C6-C7. There is moderate severity narrowing of the anterior atlantoaxial articulation. Marked severity intervertebral disc space narrowing is seen at the level of C6-C7, with moderate severity intervertebral disc space narrowing seen at C3-C4 and C5-C6. Bilateral marked severity multilevel facet joint hypertrophy is noted. Upper chest: Negative. Other: None. IMPRESSION: 1. No acute fracture or  traumatic subluxation of the cervical spine. 2. Stable 1 mm to 2 mm anterolisthesis of the C7 vertebral body on T1. 3. Moderate to marked severity multilevel degenerative changes, as described above. Electronically Signed   By: Aram Candela M.D.   On: 08/18/2022 21:38   CT Head Wo Contrast  Result Date: 08/18/2022 CLINICAL DATA:  Status post fall. EXAM: CT HEAD WITHOUT CONTRAST TECHNIQUE: Contiguous axial images were obtained from the base of the skull through the vertex without intravenous contrast. RADIATION DOSE REDUCTION: This exam was performed according to the departmental dose-optimization program which includes automated exposure control, adjustment of the mA and/or kV according to patient size and/or use of iterative reconstruction technique. COMPARISON:  July 26, 2022 FINDINGS: Brain: There is mild to moderate severity cerebral atrophy with widening of the extra-axial spaces and ventricular dilatation. There are areas of decreased attenuation within the white matter tracts of the supratentorial brain, consistent with microvascular disease changes. Vascular: No hyperdense vessel or unexpected calcification. Skull: Normal. Negative for fracture or focal lesion. Sinuses/Orbits: No acute finding. Other: None. IMPRESSION: 1. No acute intracranial abnormality. 2. Generalize cerebral atrophy and microvascular disease changes of the supratentorial brain. Electronically Signed   By: Aram Candela M.D.   On: 08/18/2022 21:30   DG Lumbar Spine Complete  Result Date: 08/18/2022 CLINICAL DATA:  Recent fall with low back pain, initial encounter EXAM: LUMBAR SPINE - COMPLETE 4+ VIEW COMPARISON:  05/20/2022 FINDINGS: Five lumbar type vertebral bodies are well visualized. Mild osteophytic changes are noted. No pars defects are seen. No compression deformity is seen. Prior right hip replacement is noted. No soft tissue abnormality is noted. IMPRESSION: Mild degenerative change without acute abnormality.  Electronically Signed   By: Alcide Clever M.D.   On: 08/18/2022 21:05   DG Thoracic Spine 2 View  Result Date: 08/18/2022 CLINICAL DATA:  Fall yesterday with upper back pain, initial encounter EXAM: THORACIC SPINE 2 VIEWS COMPARISON:  None Available. FINDINGS: Vertebral body height is well maintained. No pedicle abnormality is noted. No paraspinal  mass is seen. Visualize ribcage is within normal limits. IMPRESSION: No acute abnormality noted. Electronically Signed   By: Alcide Clever M.D.   On: 08/18/2022 21:04   DG Humerus Left  Result Date: 08/18/2022 CLINICAL DATA:  Trauma, fall EXAM: LEFT HUMERUS - 2+ VIEW COMPARISON:  None Available. FINDINGS: No recent fracture or dislocation is seen. Small linear calcific density is seen adjacent to the greater tuberosity of proximal left humerus. IMPRESSION: No recent fracture or dislocation is seen in left humerus. Small linear calcific density adjacent to the greater tuberosity of proximal left humerus may suggest calcific bursitis or calcific tendinosis. Electronically Signed   By: Ernie Avena M.D.   On: 08/18/2022 21:01   DG Shoulder Left  Result Date: 08/18/2022 CLINICAL DATA:  Trauma, fall EXAM: LEFT SHOULDER - 2+ VIEW COMPARISON:  02/16/2022 FINDINGS: No displaced fracture or dislocation is seen. Small linear calcific density is noted adjacent to the greater tuberosity. This may suggest calcific bursitis or calcific tendinosis. Similar finding was seen in the previous study. IMPRESSION: No fracture or dislocation is seen. Small linear calcification adjacent to the greater tuberosity of proximal left humerus may suggest calcific bursitis or calcific tendinosis. Electronically Signed   By: Ernie Avena M.D.   On: 08/18/2022 20:59    Procedures Procedures  {Document cardiac monitor, telemetry assessment procedure when appropriate:1}  Medications Ordered in ED Medications  morphine (PF) 4 MG/ML injection 4 mg (4 mg Intravenous Given 08/18/22  2107)    ED Course/ Medical Decision Making/ A&P Clinical Course as of 08/18/22 2225  Sun Aug 18, 2022  2214 CBC(!) Stable hemoglobin.  No leukocytosis [JK]  2215 Basic metabolic panel(!) Creatinine slightly elevated [JK]  2215 CT of the cervical spine does not show any acute fracture.  Degenerative changes noted [JK]  2215 CT of the head without acute findings [JK]  2215 X-rays of thoracic and lumbar spine without acute findings [JK]  2215 Shoulder x-ray and humerus x-ray without acute fracture or dislocation.  There may be calcific bursitis or calcific tendinosis [JK]    Clinical Course User Index [JK] Linwood Dibbles, MD   {   Click here for ABCD2, HEART and other calculatorsREFRESH Note before signing :1}                          Medical Decision Making Amount and/or Complexity of Data Reviewed Labs: ordered. Decision-making details documented in ED Course. Radiology: ordered.  Risk Prescription drug management.   ***  {Document critical care time when appropriate:1} {Document review of labs and clinical decision tools ie heart score, Chads2Vasc2 etc:1}  {Document your independent review of radiology images, and any outside records:1} {Document your discussion with family members, caretakers, and with consultants:1} {Document social determinants of health affecting pt's care:1} {Document your decision making why or why not admission, treatments were needed:1} Final Clinical Impression(s) / ED Diagnoses Final diagnoses:  Fall, initial encounter    Rx / DC Orders ED Discharge Orders          Ordered    lidocaine (LIDODERM) 5 %  Every 24 hours        08/18/22 2225

## 2022-08-18 NOTE — ED Triage Notes (Signed)
Patient BIB from [facility] s/p fall yesterday.  Fall was unwitnessed.  C/o pain to L back and L rib pain.  No reports of LOC.

## 2022-08-18 NOTE — Discharge Instructions (Addendum)
The x-rays fortunately did not show any acute fracture.  You do have some evidence of tendinitis and bursitis in your left shoulder.  This may have been exacerbated by your fall.  Take Tylenol for pain.  Use the lidocaine patch in addition to help manage the pain.  Follow-up with your doctor or an orthopedic doctor to be rechecked if the symptoms do not improve in the next week

## 2022-08-19 DIAGNOSIS — Z743 Need for continuous supervision: Secondary | ICD-10-CM | POA: Diagnosis not present

## 2022-08-19 DIAGNOSIS — H8110 Benign paroxysmal vertigo, unspecified ear: Secondary | ICD-10-CM | POA: Diagnosis not present

## 2022-08-19 DIAGNOSIS — N401 Enlarged prostate with lower urinary tract symptoms: Secondary | ICD-10-CM | POA: Diagnosis not present

## 2022-08-19 DIAGNOSIS — M549 Dorsalgia, unspecified: Secondary | ICD-10-CM | POA: Diagnosis not present

## 2022-08-19 DIAGNOSIS — F339 Major depressive disorder, recurrent, unspecified: Secondary | ICD-10-CM | POA: Diagnosis not present

## 2022-08-19 DIAGNOSIS — R1312 Dysphagia, oropharyngeal phase: Secondary | ICD-10-CM | POA: Diagnosis not present

## 2022-08-19 DIAGNOSIS — G20A1 Parkinson's disease without dyskinesia, without mention of fluctuations: Secondary | ICD-10-CM | POA: Diagnosis not present

## 2022-08-19 DIAGNOSIS — E43 Unspecified severe protein-calorie malnutrition: Secondary | ICD-10-CM | POA: Diagnosis not present

## 2022-08-19 DIAGNOSIS — I714 Abdominal aortic aneurysm, without rupture, unspecified: Secondary | ICD-10-CM | POA: Diagnosis not present

## 2022-08-19 DIAGNOSIS — S72011D Unspecified intracapsular fracture of right femur, subsequent encounter for closed fracture with routine healing: Secondary | ICD-10-CM | POA: Diagnosis not present

## 2022-08-19 DIAGNOSIS — R338 Other retention of urine: Secondary | ICD-10-CM | POA: Diagnosis not present

## 2022-08-22 DIAGNOSIS — R1312 Dysphagia, oropharyngeal phase: Secondary | ICD-10-CM | POA: Diagnosis not present

## 2022-08-22 DIAGNOSIS — I714 Abdominal aortic aneurysm, without rupture, unspecified: Secondary | ICD-10-CM | POA: Diagnosis not present

## 2022-08-22 DIAGNOSIS — S72011D Unspecified intracapsular fracture of right femur, subsequent encounter for closed fracture with routine healing: Secondary | ICD-10-CM | POA: Diagnosis not present

## 2022-08-22 DIAGNOSIS — G20A1 Parkinson's disease without dyskinesia, without mention of fluctuations: Secondary | ICD-10-CM | POA: Diagnosis not present

## 2022-08-22 DIAGNOSIS — R338 Other retention of urine: Secondary | ICD-10-CM | POA: Diagnosis not present

## 2022-08-22 DIAGNOSIS — H8110 Benign paroxysmal vertigo, unspecified ear: Secondary | ICD-10-CM | POA: Diagnosis not present

## 2022-08-22 DIAGNOSIS — N401 Enlarged prostate with lower urinary tract symptoms: Secondary | ICD-10-CM | POA: Diagnosis not present

## 2022-08-22 DIAGNOSIS — E43 Unspecified severe protein-calorie malnutrition: Secondary | ICD-10-CM | POA: Diagnosis not present

## 2022-08-22 DIAGNOSIS — F339 Major depressive disorder, recurrent, unspecified: Secondary | ICD-10-CM | POA: Diagnosis not present

## 2022-08-23 ENCOUNTER — Telehealth: Payer: Self-pay

## 2022-08-23 DIAGNOSIS — G20A1 Parkinson's disease without dyskinesia, without mention of fluctuations: Secondary | ICD-10-CM | POA: Diagnosis not present

## 2022-08-23 DIAGNOSIS — S72011D Unspecified intracapsular fracture of right femur, subsequent encounter for closed fracture with routine healing: Secondary | ICD-10-CM | POA: Diagnosis not present

## 2022-08-23 DIAGNOSIS — H8110 Benign paroxysmal vertigo, unspecified ear: Secondary | ICD-10-CM | POA: Diagnosis not present

## 2022-08-23 NOTE — Transitions of Care (Post Inpatient/ED Visit) (Signed)
   08/23/2022  Name: Lee Martinez MRN: [id number] DOB: 1935/09/28  Today's TOC FU Call Status: Today's TOC FU Call Status:: Successful TOC FU Call Competed TOC FU Call Complete Date: 08/23/22  Transition Care Management Follow-up Telephone Call Date of Discharge: 08/19/22 Discharge Facility: [facility]) Type of Discharge: Inpatient Admission Primary Inpatient Discharge Diagnosis:: FALL How have you been since you were released from the hospital?: Better Any questions or concerns?: No  Items Reviewed: Did you receive and understand the discharge instructions provided?: Yes Any new allergies since your discharge?: Yes Dietary orders reviewed?: NA Do you have support at home?: Yes Name of Support/Comfort Primary Source: at nursing home  Home Care and Equipment/Supplies: Were Home Health Services Ordered?: No Any new equipment or medical supplies ordered?: No  Functional Questionnaire: Do you need assistance with bathing/showering or dressing?: Yes Do you need assistance with meal preparation?: Yes Do you need assistance with eating?: Yes Do you have difficulty maintaining continence: Yes Do you need assistance with getting out of bed/getting out of a chair/moving?: Yes Do you have difficulty managing or taking your medications?: Yes  Follow up appointments reviewed: PCP Follow-up appointment confirmed?: No MD Provider Line [phone] Given: Yes Specialist Hospital Follow-up appointment confirmed?: No Do you need transportation to your follow-up appointment?: No Do you understand care options if your condition(s) worsen?: Yes-patient verbalized understanding    SIGNATURE  Lisabeth Devoid, CMA

## 2022-08-27 DIAGNOSIS — F331 Major depressive disorder, recurrent, moderate: Secondary | ICD-10-CM | POA: Diagnosis not present

## 2022-08-27 DIAGNOSIS — N401 Enlarged prostate with lower urinary tract symptoms: Secondary | ICD-10-CM | POA: Diagnosis not present

## 2022-08-27 DIAGNOSIS — G20A1 Parkinson's disease without dyskinesia, without mention of fluctuations: Secondary | ICD-10-CM | POA: Diagnosis not present

## 2022-08-28 DIAGNOSIS — H8110 Benign paroxysmal vertigo, unspecified ear: Secondary | ICD-10-CM | POA: Diagnosis not present

## 2022-08-28 DIAGNOSIS — E43 Unspecified severe protein-calorie malnutrition: Secondary | ICD-10-CM | POA: Diagnosis not present

## 2022-08-28 DIAGNOSIS — I714 Abdominal aortic aneurysm, without rupture, unspecified: Secondary | ICD-10-CM | POA: Diagnosis not present

## 2022-08-28 DIAGNOSIS — R1312 Dysphagia, oropharyngeal phase: Secondary | ICD-10-CM | POA: Diagnosis not present

## 2022-08-28 DIAGNOSIS — N401 Enlarged prostate with lower urinary tract symptoms: Secondary | ICD-10-CM | POA: Diagnosis not present

## 2022-08-28 DIAGNOSIS — S72011D Unspecified intracapsular fracture of right femur, subsequent encounter for closed fracture with routine healing: Secondary | ICD-10-CM | POA: Diagnosis not present

## 2022-08-28 DIAGNOSIS — F339 Major depressive disorder, recurrent, unspecified: Secondary | ICD-10-CM | POA: Diagnosis not present

## 2022-08-28 DIAGNOSIS — R338 Other retention of urine: Secondary | ICD-10-CM | POA: Diagnosis not present

## 2022-08-28 DIAGNOSIS — G20A1 Parkinson's disease without dyskinesia, without mention of fluctuations: Secondary | ICD-10-CM | POA: Diagnosis not present

## 2022-08-29 DIAGNOSIS — N401 Enlarged prostate with lower urinary tract symptoms: Secondary | ICD-10-CM | POA: Diagnosis not present

## 2022-08-29 DIAGNOSIS — E43 Unspecified severe protein-calorie malnutrition: Secondary | ICD-10-CM | POA: Diagnosis not present

## 2022-08-29 DIAGNOSIS — S72011D Unspecified intracapsular fracture of right femur, subsequent encounter for closed fracture with routine healing: Secondary | ICD-10-CM | POA: Diagnosis not present

## 2022-08-29 DIAGNOSIS — R338 Other retention of urine: Secondary | ICD-10-CM | POA: Diagnosis not present

## 2022-08-29 DIAGNOSIS — H8110 Benign paroxysmal vertigo, unspecified ear: Secondary | ICD-10-CM | POA: Diagnosis not present

## 2022-08-29 DIAGNOSIS — F339 Major depressive disorder, recurrent, unspecified: Secondary | ICD-10-CM | POA: Diagnosis not present

## 2022-08-29 DIAGNOSIS — R1312 Dysphagia, oropharyngeal phase: Secondary | ICD-10-CM | POA: Diagnosis not present

## 2022-08-29 DIAGNOSIS — I714 Abdominal aortic aneurysm, without rupture, unspecified: Secondary | ICD-10-CM | POA: Diagnosis not present

## 2022-08-29 DIAGNOSIS — G20A1 Parkinson's disease without dyskinesia, without mention of fluctuations: Secondary | ICD-10-CM | POA: Diagnosis not present

## 2022-09-05 DIAGNOSIS — I714 Abdominal aortic aneurysm, without rupture, unspecified: Secondary | ICD-10-CM | POA: Diagnosis not present

## 2022-09-05 DIAGNOSIS — H8110 Benign paroxysmal vertigo, unspecified ear: Secondary | ICD-10-CM | POA: Diagnosis not present

## 2022-09-05 DIAGNOSIS — G20A1 Parkinson's disease without dyskinesia, without mention of fluctuations: Secondary | ICD-10-CM | POA: Diagnosis not present

## 2022-09-05 DIAGNOSIS — E43 Unspecified severe protein-calorie malnutrition: Secondary | ICD-10-CM | POA: Diagnosis not present

## 2022-09-05 DIAGNOSIS — R1312 Dysphagia, oropharyngeal phase: Secondary | ICD-10-CM | POA: Diagnosis not present

## 2022-09-05 DIAGNOSIS — S72011D Unspecified intracapsular fracture of right femur, subsequent encounter for closed fracture with routine healing: Secondary | ICD-10-CM | POA: Diagnosis not present

## 2022-09-05 DIAGNOSIS — N401 Enlarged prostate with lower urinary tract symptoms: Secondary | ICD-10-CM | POA: Diagnosis not present

## 2022-09-05 DIAGNOSIS — F339 Major depressive disorder, recurrent, unspecified: Secondary | ICD-10-CM | POA: Diagnosis not present

## 2022-09-05 DIAGNOSIS — R338 Other retention of urine: Secondary | ICD-10-CM | POA: Diagnosis not present

## 2022-09-10 DIAGNOSIS — I714 Abdominal aortic aneurysm, without rupture, unspecified: Secondary | ICD-10-CM | POA: Diagnosis not present

## 2022-09-10 DIAGNOSIS — R1312 Dysphagia, oropharyngeal phase: Secondary | ICD-10-CM | POA: Diagnosis not present

## 2022-09-10 DIAGNOSIS — F331 Major depressive disorder, recurrent, moderate: Secondary | ICD-10-CM | POA: Diagnosis not present

## 2022-09-10 DIAGNOSIS — H8110 Benign paroxysmal vertigo, unspecified ear: Secondary | ICD-10-CM | POA: Diagnosis not present

## 2022-09-10 DIAGNOSIS — R338 Other retention of urine: Secondary | ICD-10-CM | POA: Diagnosis not present

## 2022-09-10 DIAGNOSIS — E43 Unspecified severe protein-calorie malnutrition: Secondary | ICD-10-CM | POA: Diagnosis not present

## 2022-09-10 DIAGNOSIS — F339 Major depressive disorder, recurrent, unspecified: Secondary | ICD-10-CM | POA: Diagnosis not present

## 2022-09-10 DIAGNOSIS — N401 Enlarged prostate with lower urinary tract symptoms: Secondary | ICD-10-CM | POA: Diagnosis not present

## 2022-09-10 DIAGNOSIS — G20A1 Parkinson's disease without dyskinesia, without mention of fluctuations: Secondary | ICD-10-CM | POA: Diagnosis not present

## 2022-09-10 DIAGNOSIS — S72011D Unspecified intracapsular fracture of right femur, subsequent encounter for closed fracture with routine healing: Secondary | ICD-10-CM | POA: Diagnosis not present

## 2022-09-10 DIAGNOSIS — D508 Other iron deficiency anemias: Secondary | ICD-10-CM | POA: Diagnosis not present

## 2022-09-16 DIAGNOSIS — E43 Unspecified severe protein-calorie malnutrition: Secondary | ICD-10-CM | POA: Diagnosis not present

## 2022-09-16 DIAGNOSIS — I714 Abdominal aortic aneurysm, without rupture, unspecified: Secondary | ICD-10-CM | POA: Diagnosis not present

## 2022-09-16 DIAGNOSIS — F339 Major depressive disorder, recurrent, unspecified: Secondary | ICD-10-CM | POA: Diagnosis not present

## 2022-09-16 DIAGNOSIS — N401 Enlarged prostate with lower urinary tract symptoms: Secondary | ICD-10-CM | POA: Diagnosis not present

## 2022-09-16 DIAGNOSIS — R1312 Dysphagia, oropharyngeal phase: Secondary | ICD-10-CM | POA: Diagnosis not present

## 2022-09-16 DIAGNOSIS — S72011D Unspecified intracapsular fracture of right femur, subsequent encounter for closed fracture with routine healing: Secondary | ICD-10-CM | POA: Diagnosis not present

## 2022-09-16 DIAGNOSIS — H8110 Benign paroxysmal vertigo, unspecified ear: Secondary | ICD-10-CM | POA: Diagnosis not present

## 2022-09-16 DIAGNOSIS — G20A1 Parkinson's disease without dyskinesia, without mention of fluctuations: Secondary | ICD-10-CM | POA: Diagnosis not present

## 2022-09-16 DIAGNOSIS — R338 Other retention of urine: Secondary | ICD-10-CM | POA: Diagnosis not present

## 2022-09-24 DIAGNOSIS — D508 Other iron deficiency anemias: Secondary | ICD-10-CM | POA: Diagnosis not present

## 2022-09-24 DIAGNOSIS — N401 Enlarged prostate with lower urinary tract symptoms: Secondary | ICD-10-CM | POA: Diagnosis not present

## 2022-09-24 DIAGNOSIS — F331 Major depressive disorder, recurrent, moderate: Secondary | ICD-10-CM | POA: Diagnosis not present

## 2022-09-25 DIAGNOSIS — I714 Abdominal aortic aneurysm, without rupture, unspecified: Secondary | ICD-10-CM | POA: Diagnosis not present

## 2022-09-25 DIAGNOSIS — G20A1 Parkinson's disease without dyskinesia, without mention of fluctuations: Secondary | ICD-10-CM | POA: Diagnosis not present

## 2022-09-25 DIAGNOSIS — H8110 Benign paroxysmal vertigo, unspecified ear: Secondary | ICD-10-CM | POA: Diagnosis not present

## 2022-09-25 DIAGNOSIS — R338 Other retention of urine: Secondary | ICD-10-CM | POA: Diagnosis not present

## 2022-09-25 DIAGNOSIS — E43 Unspecified severe protein-calorie malnutrition: Secondary | ICD-10-CM | POA: Diagnosis not present

## 2022-09-25 DIAGNOSIS — F339 Major depressive disorder, recurrent, unspecified: Secondary | ICD-10-CM | POA: Diagnosis not present

## 2022-09-25 DIAGNOSIS — S72011D Unspecified intracapsular fracture of right femur, subsequent encounter for closed fracture with routine healing: Secondary | ICD-10-CM | POA: Diagnosis not present

## 2022-09-25 DIAGNOSIS — N401 Enlarged prostate with lower urinary tract symptoms: Secondary | ICD-10-CM | POA: Diagnosis not present

## 2022-09-25 DIAGNOSIS — R1312 Dysphagia, oropharyngeal phase: Secondary | ICD-10-CM | POA: Diagnosis not present

## 2022-09-30 ENCOUNTER — Ambulatory Visit: Payer: Medicare HMO | Admitting: Diagnostic Neuroimaging

## 2022-09-30 ENCOUNTER — Encounter: Payer: Self-pay | Admitting: Diagnostic Neuroimaging

## 2022-09-30 VITALS — BP 95/60 | HR 79 | Ht 71.0 in | Wt 146.8 lb

## 2022-09-30 DIAGNOSIS — G20A1 Parkinson's disease without dyskinesia, without mention of fluctuations: Secondary | ICD-10-CM

## 2022-09-30 NOTE — Patient Instructions (Signed)
  PARKINSONS DISEASE (advanced, with cognitive decline) - continue carbidopa-levodopa 25/100 --> 3 tabs 3 times per day - continue supportive care at assisted living   DEPRESSION / ANXIETY - sertrailine 50mg  daily

## 2022-09-30 NOTE — Progress Notes (Signed)
    Chief Complaint  Patient presents with   Follow-up    Patient in room #7 with two friends from [facility]. Patient states he here today to f/u with parkinson's.     History of Present Illness:  UPDATE (09/30/22, VRP): Since last visit, had multiple falls, now in assisted living [facility]). More memory loss issues. Tolerating meds.   UPDATE (02/05/22, VRP): Since last visit, doing about the same. Continues with tremors.   UPDATE (09/25/20, VRP): Since last visit, doing well. Symptoms are moderate. Severity is mild. No alleviating or aggravating factors. Tolerating meds.     UPDATE (04/26/20, VRP): Since last visit, PD is slightly progressing. Now taking carb/levo 25/100 5x per day. No alleviating or aggravating factors. Tolerating meds.     Observations/Objective:  GENERAL EXAM/CONSTITUTIONAL: Vitals:  Vitals:   09/30/22 1510  BP: 95/60  Pulse: 79  Weight: 146 lb 12.8 oz (66.6 kg)  Height: 5\' 11"  (1.803 m)   Body mass index is 20.47 kg/m. Wt Readings from Last 3 Encounters:  09/30/22 146 lb 12.8 oz (66.6 kg)  07/26/22 142 lb 3.2 oz (64.5 kg)  06/23/22 142 lb 3.2 oz (64.5 kg)   FRAIL APPEARING  CARDIOVASCULAR: Regular rate and rhythm, no murmurs  EYES: Ophthalmoscopic exam of optic discs and posterior segments is normal; no papilledema or hemorrhages No results found.  MUSCULOSKELETAL: Gait, strength, tone, movements noted in Neurologic exam below  NEUROLOGIC: MENTAL STATUS:      No data to display         awake, alert, oriented to person DECR ATTENTION, FLUENCY  CRANIAL NERVE:  2nd - no papilledema on fundoscopic exam 2nd, 3rd, 4th, 6th - pupils equal and reactive to light, visual fields full to confrontation, extraocular muscles intact, no nystagmus 5th - facial sensation symmetric 7th - facial strength symmetric 8th - hearing intact 9th - palate elevates symmetrically, uvula midline 11th - shoulder shrug symmetric 12th - tongue protrusion  midline  MOTOR:  MILD RESTING TREMOR; BRADYKINESIA IN BUE AND BLE  SENSORY:  normal and symmetric to light touch  COORDINATION:  finger-nose-finger, fine finger movements SLOW  REFLEXES:  deep tendon reflexes TRACE and symmetric  GAIT/STATION:  IN WHEELCHAIR; UNSTEADY    Assessment and Plan:  Dx:  1. Parkinson's disease without dyskinesia or fluctuating manifestations     PARKINSONS DISEASE (advanced, with cognitive decline) - continue carbidopa-levodopa 25/100 --> 3 tabs 3 times per day - continue supportive care at assisted living   DEPRESSION / ANXIETY - sertrailine 50mg  daily   Follow Up Instructions:  - Return for pending if symptoms worsen or fail to improve.        Suanne Marker, MD 09/30/2022, 4:00 PM Certified in Neurology, Neurophysiology and Neuroimaging  [facility] [address], [address] [place], [place] [address] [phone]

## 2022-10-02 DIAGNOSIS — I714 Abdominal aortic aneurysm, without rupture, unspecified: Secondary | ICD-10-CM | POA: Diagnosis not present

## 2022-10-02 DIAGNOSIS — G20A1 Parkinson's disease without dyskinesia, without mention of fluctuations: Secondary | ICD-10-CM | POA: Diagnosis not present

## 2022-10-02 DIAGNOSIS — R338 Other retention of urine: Secondary | ICD-10-CM | POA: Diagnosis not present

## 2022-10-02 DIAGNOSIS — R1312 Dysphagia, oropharyngeal phase: Secondary | ICD-10-CM | POA: Diagnosis not present

## 2022-10-02 DIAGNOSIS — G3183 Dementia with Lewy bodies: Secondary | ICD-10-CM | POA: Diagnosis not present

## 2022-10-02 DIAGNOSIS — N401 Enlarged prostate with lower urinary tract symptoms: Secondary | ICD-10-CM | POA: Diagnosis not present

## 2022-10-02 DIAGNOSIS — F339 Major depressive disorder, recurrent, unspecified: Secondary | ICD-10-CM | POA: Diagnosis not present

## 2022-10-02 DIAGNOSIS — S72011D Unspecified intracapsular fracture of right femur, subsequent encounter for closed fracture with routine healing: Secondary | ICD-10-CM | POA: Diagnosis not present

## 2022-10-02 DIAGNOSIS — E43 Unspecified severe protein-calorie malnutrition: Secondary | ICD-10-CM | POA: Diagnosis not present

## 2022-10-02 DIAGNOSIS — F331 Major depressive disorder, recurrent, moderate: Secondary | ICD-10-CM | POA: Diagnosis not present

## 2022-10-02 DIAGNOSIS — G20B2 Parkinson's disease with dyskinesia, with fluctuations: Secondary | ICD-10-CM | POA: Diagnosis not present

## 2022-10-02 DIAGNOSIS — H8110 Benign paroxysmal vertigo, unspecified ear: Secondary | ICD-10-CM | POA: Diagnosis not present

## 2022-10-02 DIAGNOSIS — F0280 Dementia in other diseases classified elsewhere without behavioral disturbance: Secondary | ICD-10-CM | POA: Diagnosis not present

## 2022-10-05 DIAGNOSIS — G20A1 Parkinson's disease without dyskinesia, without mention of fluctuations: Secondary | ICD-10-CM | POA: Diagnosis not present

## 2022-10-05 DIAGNOSIS — H8110 Benign paroxysmal vertigo, unspecified ear: Secondary | ICD-10-CM | POA: Diagnosis not present

## 2022-10-05 DIAGNOSIS — I714 Abdominal aortic aneurysm, without rupture, unspecified: Secondary | ICD-10-CM | POA: Diagnosis not present

## 2022-10-05 DIAGNOSIS — R1312 Dysphagia, oropharyngeal phase: Secondary | ICD-10-CM | POA: Diagnosis not present

## 2022-10-05 DIAGNOSIS — S72011D Unspecified intracapsular fracture of right femur, subsequent encounter for closed fracture with routine healing: Secondary | ICD-10-CM | POA: Diagnosis not present

## 2022-10-05 DIAGNOSIS — E43 Unspecified severe protein-calorie malnutrition: Secondary | ICD-10-CM | POA: Diagnosis not present

## 2022-10-05 DIAGNOSIS — F339 Major depressive disorder, recurrent, unspecified: Secondary | ICD-10-CM | POA: Diagnosis not present

## 2022-10-05 DIAGNOSIS — N401 Enlarged prostate with lower urinary tract symptoms: Secondary | ICD-10-CM | POA: Diagnosis not present

## 2022-10-05 DIAGNOSIS — R338 Other retention of urine: Secondary | ICD-10-CM | POA: Diagnosis not present

## 2022-10-08 DIAGNOSIS — H8110 Benign paroxysmal vertigo, unspecified ear: Secondary | ICD-10-CM | POA: Diagnosis not present

## 2022-10-08 DIAGNOSIS — F339 Major depressive disorder, recurrent, unspecified: Secondary | ICD-10-CM | POA: Diagnosis not present

## 2022-10-08 DIAGNOSIS — R338 Other retention of urine: Secondary | ICD-10-CM | POA: Diagnosis not present

## 2022-10-08 DIAGNOSIS — G20A1 Parkinson's disease without dyskinesia, without mention of fluctuations: Secondary | ICD-10-CM | POA: Diagnosis not present

## 2022-10-08 DIAGNOSIS — I714 Abdominal aortic aneurysm, without rupture, unspecified: Secondary | ICD-10-CM | POA: Diagnosis not present

## 2022-10-08 DIAGNOSIS — R1312 Dysphagia, oropharyngeal phase: Secondary | ICD-10-CM | POA: Diagnosis not present

## 2022-10-08 DIAGNOSIS — S72011D Unspecified intracapsular fracture of right femur, subsequent encounter for closed fracture with routine healing: Secondary | ICD-10-CM | POA: Diagnosis not present

## 2022-10-08 DIAGNOSIS — E43 Unspecified severe protein-calorie malnutrition: Secondary | ICD-10-CM | POA: Diagnosis not present

## 2022-10-08 DIAGNOSIS — N401 Enlarged prostate with lower urinary tract symptoms: Secondary | ICD-10-CM | POA: Diagnosis not present

## 2022-10-09 DIAGNOSIS — D508 Other iron deficiency anemias: Secondary | ICD-10-CM | POA: Diagnosis not present

## 2022-10-09 DIAGNOSIS — F331 Major depressive disorder, recurrent, moderate: Secondary | ICD-10-CM | POA: Diagnosis not present

## 2022-10-22 DIAGNOSIS — N400 Enlarged prostate without lower urinary tract symptoms: Secondary | ICD-10-CM | POA: Diagnosis not present

## 2022-10-22 DIAGNOSIS — G20B2 Parkinson's disease with dyskinesia, with fluctuations: Secondary | ICD-10-CM | POA: Diagnosis not present

## 2022-10-22 DIAGNOSIS — K5900 Constipation, unspecified: Secondary | ICD-10-CM | POA: Diagnosis not present

## 2022-10-29 DIAGNOSIS — I7143 Infrarenal abdominal aortic aneurysm, without rupture: Secondary | ICD-10-CM | POA: Diagnosis not present

## 2022-10-29 DIAGNOSIS — G20B2 Parkinson's disease with dyskinesia, with fluctuations: Secondary | ICD-10-CM | POA: Diagnosis not present

## 2022-10-29 DIAGNOSIS — N401 Enlarged prostate with lower urinary tract symptoms: Secondary | ICD-10-CM | POA: Diagnosis not present

## 2022-10-29 DIAGNOSIS — K8689 Other specified diseases of pancreas: Secondary | ICD-10-CM | POA: Diagnosis not present

## 2022-10-30 DIAGNOSIS — G20A1 Parkinson's disease without dyskinesia, without mention of fluctuations: Secondary | ICD-10-CM | POA: Diagnosis not present

## 2022-10-30 DIAGNOSIS — R2681 Unsteadiness on feet: Secondary | ICD-10-CM | POA: Diagnosis not present

## 2022-11-01 DIAGNOSIS — R2681 Unsteadiness on feet: Secondary | ICD-10-CM | POA: Diagnosis not present

## 2022-11-01 DIAGNOSIS — G20A1 Parkinson's disease without dyskinesia, without mention of fluctuations: Secondary | ICD-10-CM | POA: Diagnosis not present

## 2022-11-07 DIAGNOSIS — G20A1 Parkinson's disease without dyskinesia, without mention of fluctuations: Secondary | ICD-10-CM | POA: Diagnosis not present

## 2022-11-07 DIAGNOSIS — R2681 Unsteadiness on feet: Secondary | ICD-10-CM | POA: Diagnosis not present

## 2022-11-08 ENCOUNTER — Encounter: Payer: Self-pay | Admitting: Gastroenterology

## 2022-11-08 DIAGNOSIS — G20A1 Parkinson's disease without dyskinesia, without mention of fluctuations: Secondary | ICD-10-CM | POA: Diagnosis not present

## 2022-11-08 DIAGNOSIS — R2681 Unsteadiness on feet: Secondary | ICD-10-CM | POA: Diagnosis not present

## 2022-11-08 DIAGNOSIS — E782 Mixed hyperlipidemia: Secondary | ICD-10-CM | POA: Diagnosis not present

## 2022-11-08 DIAGNOSIS — D649 Anemia, unspecified: Secondary | ICD-10-CM | POA: Diagnosis not present

## 2022-11-11 DIAGNOSIS — G20A1 Parkinson's disease without dyskinesia, without mention of fluctuations: Secondary | ICD-10-CM | POA: Diagnosis not present

## 2022-11-11 DIAGNOSIS — R2681 Unsteadiness on feet: Secondary | ICD-10-CM | POA: Diagnosis not present

## 2022-11-13 DIAGNOSIS — R2681 Unsteadiness on feet: Secondary | ICD-10-CM | POA: Diagnosis not present

## 2022-11-13 DIAGNOSIS — G20A1 Parkinson's disease without dyskinesia, without mention of fluctuations: Secondary | ICD-10-CM | POA: Diagnosis not present

## 2022-11-19 DIAGNOSIS — E782 Mixed hyperlipidemia: Secondary | ICD-10-CM | POA: Diagnosis not present

## 2022-11-19 DIAGNOSIS — G20B2 Parkinson's disease with dyskinesia, with fluctuations: Secondary | ICD-10-CM | POA: Diagnosis not present

## 2022-11-19 DIAGNOSIS — Z79899 Other long term (current) drug therapy: Secondary | ICD-10-CM | POA: Diagnosis not present

## 2022-11-19 DIAGNOSIS — R2681 Unsteadiness on feet: Secondary | ICD-10-CM | POA: Diagnosis not present

## 2022-11-19 DIAGNOSIS — F331 Major depressive disorder, recurrent, moderate: Secondary | ICD-10-CM | POA: Diagnosis not present

## 2022-11-19 DIAGNOSIS — G20A1 Parkinson's disease without dyskinesia, without mention of fluctuations: Secondary | ICD-10-CM | POA: Diagnosis not present

## 2022-11-20 DIAGNOSIS — E785 Hyperlipidemia, unspecified: Secondary | ICD-10-CM | POA: Diagnosis not present

## 2022-11-20 DIAGNOSIS — G20A1 Parkinson's disease without dyskinesia, without mention of fluctuations: Secondary | ICD-10-CM | POA: Diagnosis not present

## 2022-11-20 DIAGNOSIS — N401 Enlarged prostate with lower urinary tract symptoms: Secondary | ICD-10-CM | POA: Diagnosis not present

## 2022-11-20 DIAGNOSIS — R2681 Unsteadiness on feet: Secondary | ICD-10-CM | POA: Diagnosis not present

## 2022-11-21 DIAGNOSIS — R2681 Unsteadiness on feet: Secondary | ICD-10-CM | POA: Diagnosis not present

## 2022-11-21 DIAGNOSIS — G20A1 Parkinson's disease without dyskinesia, without mention of fluctuations: Secondary | ICD-10-CM | POA: Diagnosis not present

## 2022-11-27 DIAGNOSIS — R2681 Unsteadiness on feet: Secondary | ICD-10-CM | POA: Diagnosis not present

## 2022-11-27 DIAGNOSIS — G20A1 Parkinson's disease without dyskinesia, without mention of fluctuations: Secondary | ICD-10-CM | POA: Diagnosis not present

## 2022-11-28 DIAGNOSIS — R2681 Unsteadiness on feet: Secondary | ICD-10-CM | POA: Diagnosis not present

## 2022-11-28 DIAGNOSIS — G20A1 Parkinson's disease without dyskinesia, without mention of fluctuations: Secondary | ICD-10-CM | POA: Diagnosis not present

## 2022-12-03 DIAGNOSIS — H2513 Age-related nuclear cataract, bilateral: Secondary | ICD-10-CM | POA: Diagnosis not present

## 2022-12-03 DIAGNOSIS — H04123 Dry eye syndrome of bilateral lacrimal glands: Secondary | ICD-10-CM | POA: Diagnosis not present

## 2022-12-04 DIAGNOSIS — G20A1 Parkinson's disease without dyskinesia, without mention of fluctuations: Secondary | ICD-10-CM | POA: Diagnosis not present

## 2022-12-04 DIAGNOSIS — R2681 Unsteadiness on feet: Secondary | ICD-10-CM | POA: Diagnosis not present

## 2022-12-06 DIAGNOSIS — G20A1 Parkinson's disease without dyskinesia, without mention of fluctuations: Secondary | ICD-10-CM | POA: Diagnosis not present

## 2022-12-06 DIAGNOSIS — R2681 Unsteadiness on feet: Secondary | ICD-10-CM | POA: Diagnosis not present

## 2022-12-10 DIAGNOSIS — G20A1 Parkinson's disease without dyskinesia, without mention of fluctuations: Secondary | ICD-10-CM | POA: Diagnosis not present

## 2022-12-10 DIAGNOSIS — R2681 Unsteadiness on feet: Secondary | ICD-10-CM | POA: Diagnosis not present

## 2022-12-11 DIAGNOSIS — G20A1 Parkinson's disease without dyskinesia, without mention of fluctuations: Secondary | ICD-10-CM | POA: Diagnosis not present

## 2022-12-11 DIAGNOSIS — R2681 Unsteadiness on feet: Secondary | ICD-10-CM | POA: Diagnosis not present

## 2022-12-16 DIAGNOSIS — R2681 Unsteadiness on feet: Secondary | ICD-10-CM | POA: Diagnosis not present

## 2022-12-16 DIAGNOSIS — G20A1 Parkinson's disease without dyskinesia, without mention of fluctuations: Secondary | ICD-10-CM | POA: Diagnosis not present

## 2022-12-17 DIAGNOSIS — F331 Major depressive disorder, recurrent, moderate: Secondary | ICD-10-CM | POA: Diagnosis not present

## 2022-12-17 DIAGNOSIS — Z79899 Other long term (current) drug therapy: Secondary | ICD-10-CM | POA: Diagnosis not present

## 2022-12-17 DIAGNOSIS — G20B2 Parkinson's disease with dyskinesia, with fluctuations: Secondary | ICD-10-CM | POA: Diagnosis not present

## 2022-12-17 DIAGNOSIS — N401 Enlarged prostate with lower urinary tract symptoms: Secondary | ICD-10-CM | POA: Diagnosis not present

## 2022-12-20 DIAGNOSIS — R2681 Unsteadiness on feet: Secondary | ICD-10-CM | POA: Diagnosis not present

## 2022-12-20 DIAGNOSIS — G20A1 Parkinson's disease without dyskinesia, without mention of fluctuations: Secondary | ICD-10-CM | POA: Diagnosis not present

## 2022-12-23 DIAGNOSIS — R2681 Unsteadiness on feet: Secondary | ICD-10-CM | POA: Diagnosis not present

## 2022-12-23 DIAGNOSIS — G20A1 Parkinson's disease without dyskinesia, without mention of fluctuations: Secondary | ICD-10-CM | POA: Diagnosis not present

## 2022-12-25 DIAGNOSIS — R2681 Unsteadiness on feet: Secondary | ICD-10-CM | POA: Diagnosis not present

## 2022-12-25 DIAGNOSIS — G20A1 Parkinson's disease without dyskinesia, without mention of fluctuations: Secondary | ICD-10-CM | POA: Diagnosis not present

## 2022-12-31 DIAGNOSIS — R2681 Unsteadiness on feet: Secondary | ICD-10-CM | POA: Diagnosis not present

## 2022-12-31 DIAGNOSIS — G20A1 Parkinson's disease without dyskinesia, without mention of fluctuations: Secondary | ICD-10-CM | POA: Diagnosis not present

## 2023-01-01 DIAGNOSIS — R2681 Unsteadiness on feet: Secondary | ICD-10-CM | POA: Diagnosis not present

## 2023-01-01 DIAGNOSIS — G20A1 Parkinson's disease without dyskinesia, without mention of fluctuations: Secondary | ICD-10-CM | POA: Diagnosis not present

## 2023-01-06 DIAGNOSIS — R2681 Unsteadiness on feet: Secondary | ICD-10-CM | POA: Diagnosis not present

## 2023-01-06 DIAGNOSIS — G20A1 Parkinson's disease without dyskinesia, without mention of fluctuations: Secondary | ICD-10-CM | POA: Diagnosis not present

## 2023-01-08 DIAGNOSIS — R2681 Unsteadiness on feet: Secondary | ICD-10-CM | POA: Diagnosis not present

## 2023-01-08 DIAGNOSIS — G20A1 Parkinson's disease without dyskinesia, without mention of fluctuations: Secondary | ICD-10-CM | POA: Diagnosis not present

## 2023-01-10 DIAGNOSIS — R2681 Unsteadiness on feet: Secondary | ICD-10-CM | POA: Diagnosis not present

## 2023-01-10 DIAGNOSIS — G20A1 Parkinson's disease without dyskinesia, without mention of fluctuations: Secondary | ICD-10-CM | POA: Diagnosis not present

## 2023-01-10 DIAGNOSIS — N401 Enlarged prostate with lower urinary tract symptoms: Secondary | ICD-10-CM | POA: Diagnosis not present

## 2023-01-10 DIAGNOSIS — E785 Hyperlipidemia, unspecified: Secondary | ICD-10-CM | POA: Diagnosis not present

## 2023-01-14 DIAGNOSIS — N401 Enlarged prostate with lower urinary tract symptoms: Secondary | ICD-10-CM | POA: Diagnosis not present

## 2023-01-14 DIAGNOSIS — G20B2 Parkinson's disease with dyskinesia, with fluctuations: Secondary | ICD-10-CM | POA: Diagnosis not present

## 2023-01-14 DIAGNOSIS — F331 Major depressive disorder, recurrent, moderate: Secondary | ICD-10-CM | POA: Diagnosis not present

## 2023-01-14 DIAGNOSIS — R2681 Unsteadiness on feet: Secondary | ICD-10-CM | POA: Diagnosis not present

## 2023-01-14 DIAGNOSIS — G20A1 Parkinson's disease without dyskinesia, without mention of fluctuations: Secondary | ICD-10-CM | POA: Diagnosis not present

## 2023-01-14 DIAGNOSIS — Z79899 Other long term (current) drug therapy: Secondary | ICD-10-CM | POA: Diagnosis not present

## 2023-01-15 DIAGNOSIS — G20A1 Parkinson's disease without dyskinesia, without mention of fluctuations: Secondary | ICD-10-CM | POA: Diagnosis not present

## 2023-01-15 DIAGNOSIS — R2681 Unsteadiness on feet: Secondary | ICD-10-CM | POA: Diagnosis not present

## 2023-01-21 DIAGNOSIS — G20A1 Parkinson's disease without dyskinesia, without mention of fluctuations: Secondary | ICD-10-CM | POA: Diagnosis not present

## 2023-01-21 DIAGNOSIS — R2681 Unsteadiness on feet: Secondary | ICD-10-CM | POA: Diagnosis not present

## 2023-01-22 DIAGNOSIS — N401 Enlarged prostate with lower urinary tract symptoms: Secondary | ICD-10-CM | POA: Diagnosis not present

## 2023-01-22 DIAGNOSIS — G20A1 Parkinson's disease without dyskinesia, without mention of fluctuations: Secondary | ICD-10-CM | POA: Diagnosis not present

## 2023-01-22 DIAGNOSIS — G3183 Dementia with Lewy bodies: Secondary | ICD-10-CM | POA: Diagnosis not present

## 2023-01-22 DIAGNOSIS — F331 Major depressive disorder, recurrent, moderate: Secondary | ICD-10-CM | POA: Diagnosis not present

## 2023-01-22 DIAGNOSIS — K869 Disease of pancreas, unspecified: Secondary | ICD-10-CM | POA: Diagnosis not present

## 2023-01-22 DIAGNOSIS — G20B1 Parkinson's disease with dyskinesia, without mention of fluctuations: Secondary | ICD-10-CM | POA: Diagnosis not present

## 2023-01-22 DIAGNOSIS — F0280 Dementia in other diseases classified elsewhere without behavioral disturbance: Secondary | ICD-10-CM | POA: Diagnosis not present

## 2023-01-22 DIAGNOSIS — R2681 Unsteadiness on feet: Secondary | ICD-10-CM | POA: Diagnosis not present

## 2023-01-27 DIAGNOSIS — R2681 Unsteadiness on feet: Secondary | ICD-10-CM | POA: Diagnosis not present

## 2023-01-27 DIAGNOSIS — G20A1 Parkinson's disease without dyskinesia, without mention of fluctuations: Secondary | ICD-10-CM | POA: Diagnosis not present

## 2023-01-28 DIAGNOSIS — N401 Enlarged prostate with lower urinary tract symptoms: Secondary | ICD-10-CM | POA: Diagnosis not present

## 2023-01-28 DIAGNOSIS — G20B1 Parkinson's disease with dyskinesia, without mention of fluctuations: Secondary | ICD-10-CM | POA: Diagnosis not present

## 2023-01-28 DIAGNOSIS — R627 Adult failure to thrive: Secondary | ICD-10-CM | POA: Diagnosis not present

## 2023-01-29 DIAGNOSIS — G20A1 Parkinson's disease without dyskinesia, without mention of fluctuations: Secondary | ICD-10-CM | POA: Diagnosis not present

## 2023-01-29 DIAGNOSIS — R2681 Unsteadiness on feet: Secondary | ICD-10-CM | POA: Diagnosis not present

## 2023-02-04 DIAGNOSIS — R2681 Unsteadiness on feet: Secondary | ICD-10-CM | POA: Diagnosis not present

## 2023-02-04 DIAGNOSIS — G20A1 Parkinson's disease without dyskinesia, without mention of fluctuations: Secondary | ICD-10-CM | POA: Diagnosis not present

## 2023-02-05 DIAGNOSIS — R2681 Unsteadiness on feet: Secondary | ICD-10-CM | POA: Diagnosis not present

## 2023-02-05 DIAGNOSIS — G20A1 Parkinson's disease without dyskinesia, without mention of fluctuations: Secondary | ICD-10-CM | POA: Diagnosis not present

## 2023-02-06 DIAGNOSIS — Z515 Encounter for palliative care: Secondary | ICD-10-CM | POA: Diagnosis not present

## 2023-02-06 DIAGNOSIS — G20A1 Parkinson's disease without dyskinesia, without mention of fluctuations: Secondary | ICD-10-CM | POA: Diagnosis not present

## 2023-02-07 DIAGNOSIS — R2681 Unsteadiness on feet: Secondary | ICD-10-CM | POA: Diagnosis not present

## 2023-02-07 DIAGNOSIS — G20A1 Parkinson's disease without dyskinesia, without mention of fluctuations: Secondary | ICD-10-CM | POA: Diagnosis not present

## 2023-02-10 DIAGNOSIS — G20A1 Parkinson's disease without dyskinesia, without mention of fluctuations: Secondary | ICD-10-CM | POA: Diagnosis not present

## 2023-02-10 DIAGNOSIS — N401 Enlarged prostate with lower urinary tract symptoms: Secondary | ICD-10-CM | POA: Diagnosis not present

## 2023-02-10 DIAGNOSIS — E785 Hyperlipidemia, unspecified: Secondary | ICD-10-CM | POA: Diagnosis not present

## 2023-02-10 DIAGNOSIS — R2681 Unsteadiness on feet: Secondary | ICD-10-CM | POA: Diagnosis not present

## 2023-02-11 DIAGNOSIS — G20B2 Parkinson's disease with dyskinesia, with fluctuations: Secondary | ICD-10-CM | POA: Diagnosis not present

## 2023-02-11 DIAGNOSIS — K5900 Constipation, unspecified: Secondary | ICD-10-CM | POA: Diagnosis not present

## 2023-02-11 DIAGNOSIS — F0280 Dementia in other diseases classified elsewhere without behavioral disturbance: Secondary | ICD-10-CM | POA: Diagnosis not present

## 2023-02-12 ENCOUNTER — Ambulatory Visit (INDEPENDENT_AMBULATORY_CARE_PROVIDER_SITE_OTHER): Payer: Medicare Other | Admitting: Gastroenterology

## 2023-02-12 ENCOUNTER — Encounter: Payer: Self-pay | Admitting: Gastroenterology

## 2023-02-12 ENCOUNTER — Other Ambulatory Visit (INDEPENDENT_AMBULATORY_CARE_PROVIDER_SITE_OTHER): Payer: Medicare Other

## 2023-02-12 VITALS — BP 84/48 | HR 99 | Ht 71.0 in | Wt 142.2 lb

## 2023-02-12 DIAGNOSIS — R978 Other abnormal tumor markers: Secondary | ICD-10-CM

## 2023-02-12 DIAGNOSIS — K449 Diaphragmatic hernia without obstruction or gangrene: Secondary | ICD-10-CM

## 2023-02-12 DIAGNOSIS — K869 Disease of pancreas, unspecified: Secondary | ICD-10-CM

## 2023-02-12 LAB — COMPREHENSIVE METABOLIC PANEL
ALT: 2 U/L (ref 0–53)
AST: 13 U/L (ref 0–37)
Albumin: 3.9 g/dL (ref 3.5–5.2)
Alkaline Phosphatase: 93 U/L (ref 39–117)
BUN: 30 mg/dL — ABNORMAL HIGH (ref 6–23)
CO2: 29 meq/L (ref 19–32)
Calcium: 9.6 mg/dL (ref 8.4–10.5)
Chloride: 106 meq/L (ref 96–112)
Creatinine, Ser: 1.29 mg/dL (ref 0.40–1.50)
GFR: 50.78 mL/min — ABNORMAL LOW (ref >60.00)
Glucose, Bld: 120 mg/dL — ABNORMAL HIGH (ref 70–99)
Potassium: 4.3 meq/L (ref 3.5–5.1)
Sodium: 142 meq/L (ref 135–145)
Total Bilirubin: 0.4 mg/dL (ref 0.2–1.2)
Total Protein: 6.9 g/dL (ref 6.0–8.3)

## 2023-02-12 LAB — CBC
HCT: 44.6 % (ref 39.0–52.0)
Hemoglobin: 14.6 g/dL (ref 13.0–17.0)
MCHC: 32.6 g/dL (ref 30.0–36.0)
MCV: 93.4 fL (ref 78.0–100.0)
Platelets: 290 10*3/uL (ref 150.0–400.0)
RBC: 4.78 Mil/uL (ref 4.22–5.81)
RDW: 14.4 % (ref 11.5–15.5)
WBC: 7.4 10*3/uL (ref 4.0–10.5)

## 2023-02-12 NOTE — Progress Notes (Unsigned)
GASTROENTEROLOGY OUTPATIENT CLINIC VISIT   Primary Care Provider Arnette Felts, FNP [address] [place] [address] [phone]  Referring Provider Arnette Felts, FNP [address] [place],  [place] [address] [phone]  Patient Profile: Lee Martinez is a 87 y.o. male with a pmh significant for  The patient presents to the [facility] for an evaluation and management of problem(s) noted below:  Problem List No diagnosis found.  History of Present Illness    The patient does/does not take NSAIDs or BC/Goody Powder. Patient has/has not had an EGD. Patient has/has not had a Colonoscopy.  GI Review of Systems Positive as above Negative for  Pyrosis; Reflux; Regurgitation; Dysphagia; Odynophagia; Globus; Post-prandial cough; Nocturnal cough; Nasal regurgitation; Epigastric pain; Nausea; Vomiting; Hematemesis; Jaundice; Change in Appetite; Early satiety; Abdominal pain; Abdominal bloating; Eructation; Flatulence; Change in BM Frequency; Change in BM Consistency; Constipation; Diarrhea; Incontinence; Urgency; Tenesmus; Hematochezia; Melena  Review of Systems General: Denies fevers/chills/weight loss/night sweats HEENT: Denies oral lesions/sore throat/headaches/visual changes Cardiovascular: Denies chest pain/palpitations Pulmonary: Denies shortness of breath/cough Gastroenterological: See HPI Genitourinary: Denies darkened urine or hematuria Hematological: Denies easy bruising/bleeding Endocrine: Denies temperature intolerance Dermatological: Denies skin changes Psychological: Mood is stable Allergy & Immunology: Denies severe allergic reactions Musculoskeletal: Denies new arthralgias   Medications Current Outpatient Medications  Medication Sig Dispense Refill   acetaminophen (TYLENOL) 500 MG tablet Take 1 tablet (500 mg total) by mouth every 6 (six) hours as needed. 30 tablet 0   ARIPiprazole (ABILIFY) 2 MG tablet  Take 2 mg by mouth daily.     atorvastatin (LIPITOR) 10 MG tablet Take 1 tab daily in evening MWF (Patient taking differently: Take 10 mg by mouth. Monday Wednesday Friday) 45 tablet 1   carbidopa-levodopa (SINEMET IR) 25-100 MG tablet Take 3 tablets by mouth 3 (three) times daily.     docusate sodium (COLACE) 100 MG capsule Take 1 capsule (100 mg total) by mouth 2 (two) times daily. 10 capsule 0   ferrous sulfate 325 (65 FE) MG tablet Take 1 tablet (325 mg total) by mouth daily before breakfast. Take with a cup of orange juice or juice if possible  3   lactose free nutrition (BOOST PLUS) LIQD Take 237 mLs by mouth 3 (three) times daily between meals.  0   lidocaine (LIDODERM) 5 % Place 1 patch onto the skin daily. Remove & Discard patch within 12 hours or as directed by MD 10 patch 0   methocarbamol (ROBAXIN) 500 MG tablet Take 1 tablet (500 mg total) by mouth every 6 (six) hours as needed for muscle spasms. 40 tablet 1   multivitamin (PROSIGHT) TABS tablet Take 1 tablet by mouth daily. 30 tablet 0   ondansetron (ZOFRAN) 4 MG tablet Take 1 tablet (4 mg total) by mouth every 6 (six) hours as needed for nausea. 20 tablet 0   polyethylene glycol (MIRALAX / GLYCOLAX) 17 g packet Take 17 g by mouth daily. Do not give if patient is experiencing loose stools, diarrhea or frequent stools that day 14 each 0   tamsulosin (FLOMAX) 0.4 MG CAPS capsule Take 1 capsule (0.4 mg total) by mouth daily. 30 capsule    sertraline (ZOLOFT) 50 MG tablet Take 1 tablet (50 mg total) by mouth daily. 90 tablet 1   No current facility-administered medications for this visit.    Allergies No Known Allergies  Histories Past Medical History:  Diagnosis Date   Parkinson's disease (HCC) 07/2011   followed by  [facility]   Past Surgical History:  Procedure Laterality Date   basil removal     HERNIA REPAIR     THYROID SURGERY     TOTAL HIP ARTHROPLASTY Right 05/21/2022   Procedure: TOTAL HIP ARTHROPLASTY ANTERIOR  APPROACH;  Surgeon: Durene Romans, MD;  Location: [facility];  Service: Orthopedics;  Laterality: Right;   Social History   Socioeconomic History   Marital status: Widowed    Spouse name: Casimer Bilis   Number of children: 0   Years of education: 12   Highest education level: Not on file  Occupational History   Occupation: retired    Associate Professor: RETIRED  Tobacco Use   Smoking status: Never   Smokeless tobacco: Never  Vaping Use   Vaping status: Never Used  Substance and Sexual Activity   Alcohol use: Not Currently    Alcohol/week: 1.0 standard drink of alcohol    Types: 1 Shots of liquor per week   Drug use: No   Sexual activity: Not Currently    Partners: Female  Other Topics Concern   Not on file  Social History Narrative   04/26/20 Lives alone, wife passed this year   Social Determinants of Health   Financial Resource Strain: Low Risk  (02/07/2022)   Overall Financial Resource Strain (CARDIA)    Difficulty of Paying Living Expenses: Not hard at all  Food Insecurity: No Food Insecurity (05/20/2022)   Hunger Vital Sign    Worried About Running Out of Food in the Last Year: Never true    Ran Out of Food in the Last Year: Never true  Transportation Needs: No Transportation Needs (05/20/2022)   PRAPARE - Administrator, Civil Service (Medical): No    Lack of Transportation (Non-Medical): No  Physical Activity: Inactive (02/07/2022)   Exercise Vital Sign    Days of Exercise per Week: 0 days    Minutes of Exercise per Session: 0 min  Stress: No Stress Concern Present (02/07/2022)   [employer] of Occupational Health - Occupational Stress Questionnaire    Feeling of Stress : Not at all  Social Connections: Unknown (09/23/2021)   [facility]   Social Network    Social Network: Not on file  Intimate Partner Violence: Not At Risk (05/20/2022)   Humiliation, Afraid, Rape, and Kick questionnaire    Fear of Current or Ex-Partner: No     Emotionally Abused: No    Physically Abused: No    Sexually Abused: No   Family History  Problem Relation Age of Onset   Heart failure Mother    Breast cancer Sister    I have reviewed his medical, social, and family history in detail and updated the electronic medical record as necessary.    PHYSICAL EXAMINATION  BP (!) 84/48 (BP Location: Left Arm, Patient Position: Sitting, Cuff Size: Normal)   Pulse 99   Ht 5\' 11"  (1.803 m)   Wt 142 lb 4 oz (64.5 kg)   BMI 19.84 kg/m  Wt Readings from Last 3 Encounters:  02/12/23 142 lb 4 oz (64.5 kg)  09/30/22 146 lb 12.8 oz (66.6 kg)  07/26/22 142 lb 3.2 oz (64.5 kg)   GEN: NAD, appears stated age, doesn't appear chronically ill PSYCH: Cooperative, without pressured speech EYE: Conjunctivae pink, sclerae anicteric ENT: MMM, without oral ulcers, no erythema or exudates noted NECK: Supple CV: RR without R/Gs  RESP: CTAB posteriorly, without wheezing GI: NABS, soft, NT/ND, without rebound or guarding, no  HSM appreciated GU: DRE shows MSK/EXT: _ edema, no palmar erythema SKIN: No jaundice, no spider angiomata, no concerning rashes NEURO:  Alert & Oriented x 3, no focal deficits, no evidence of asterixis   REVIEW OF DATA  I reviewed the following data at the time of this encounter:  GI Procedures and Studies  ***  Laboratory Studies  ***  Imaging Studies  ***   ASSESSMENT  Mr. Sagona is a 87 y.o. male with a pmh significant for The patient is seen today for evaluation and management of:  No diagnosis found.  ***   PLAN  There are no diagnoses linked to this encounter.   No orders of the defined types were placed in this encounter.   New Prescriptions   No medications on file   Modified Medications   No medications on file    Planned Follow Up No follow-ups on file.   Total Time in Face-to-Face and in Coordination of Care for patient including independent/personal interpretation/review of prior testing,  medical history, examination, medication adjustment, communicating results with the patient directly, and documentation within the EHR is ***.   Corliss Parish, MD [facility] Office # [phone]

## 2023-02-12 NOTE — Patient Instructions (Addendum)
Your provider has requested that you go to the basement level for lab work before leaving today. Press "B" on the elevator. The lab is located at the first door on the left as you exit the elevator.  Due to recent changes in healthcare laws, you may see the results of your imaging and laboratory studies on MyChart before your provider has had a chance to review them.  We understand that in some cases there may be results that are confusing or concerning to you. Not all laboratory results come back in the same time frame and the provider may be waiting for multiple results in order to interpret others.  Please give [place] 48 hours in order for your provider to thoroughly review all the results before contacting the office for clarification of your results.   You will be contacted by [facility] Scheduling in the next 2 days to arrange a CT Abdomen -Pancreas   The number on your caller ID will be [phone], please answer when they call.  If you have not heard from them in 2 days please call [phone] to schedule.    _______________________________________________________  If your blood pressure at your visit was 140/90 or greater, please contact your primary care physician to follow up on this.  _______________________________________________________  If you are age 50 or older, your body mass index should be between 23-30. Your Body mass index is 19.84 kg/m. If this is out of the aforementioned range listed, please consider follow up with your Primary Care Provider.  If you are age 29 or younger, your body mass index should be between 19-25. Your Body mass index is 19.84 kg/m. If this is out of the aformentioned range listed, please consider follow up with your Primary Care Provider.   ________________________________________________________  The [facility] providers would like to encourage you to use [facility] to communicate with providers for non-urgent requests or questions.  Due to  long hold times on the telephone, sending your provider a message by [facility] may be a faster and more efficient way to get a response.  Please allow 48 business hours for a response.  Please remember that this is for non-urgent requests.  _______________________________________________________  Due to recent changes in healthcare laws, you may see the results of your imaging and laboratory studies on MyChart before your provider has had a chance to review them.  We understand that in some cases there may be results that are confusing or concerning to you. Not all laboratory results come back in the same time frame and the provider may be waiting for multiple results in order to interpret others.  Please give [place] 48 hours in order for your provider to thoroughly review all the results before contacting the office for clarification of your results.   Thank you for choosing me and [facility].  Dr. Meridee Score

## 2023-02-13 ENCOUNTER — Encounter: Payer: Self-pay | Admitting: Gastroenterology

## 2023-02-13 DIAGNOSIS — R2681 Unsteadiness on feet: Secondary | ICD-10-CM | POA: Diagnosis not present

## 2023-02-13 DIAGNOSIS — G20A1 Parkinson's disease without dyskinesia, without mention of fluctuations: Secondary | ICD-10-CM | POA: Diagnosis not present

## 2023-02-13 LAB — CANCER ANTIGEN 19-9: CA 19-9: 29 U/mL (ref <34)

## 2023-02-17 DIAGNOSIS — G20A1 Parkinson's disease without dyskinesia, without mention of fluctuations: Secondary | ICD-10-CM | POA: Diagnosis not present

## 2023-02-17 DIAGNOSIS — R2681 Unsteadiness on feet: Secondary | ICD-10-CM | POA: Diagnosis not present

## 2023-02-20 DIAGNOSIS — G20A1 Parkinson's disease without dyskinesia, without mention of fluctuations: Secondary | ICD-10-CM | POA: Diagnosis not present

## 2023-02-20 DIAGNOSIS — R2681 Unsteadiness on feet: Secondary | ICD-10-CM | POA: Diagnosis not present

## 2023-02-25 DIAGNOSIS — G20A1 Parkinson's disease without dyskinesia, without mention of fluctuations: Secondary | ICD-10-CM | POA: Diagnosis not present

## 2023-02-25 DIAGNOSIS — R2681 Unsteadiness on feet: Secondary | ICD-10-CM | POA: Diagnosis not present

## 2023-02-27 ENCOUNTER — Ambulatory Visit: Payer: Self-pay | Admitting: Nurse Practitioner

## 2023-02-27 DIAGNOSIS — G20A1 Parkinson's disease without dyskinesia, without mention of fluctuations: Secondary | ICD-10-CM | POA: Diagnosis not present

## 2023-02-27 DIAGNOSIS — R2681 Unsteadiness on feet: Secondary | ICD-10-CM | POA: Diagnosis not present

## 2023-03-03 DIAGNOSIS — G20A1 Parkinson's disease without dyskinesia, without mention of fluctuations: Secondary | ICD-10-CM | POA: Diagnosis not present

## 2023-03-03 DIAGNOSIS — R2681 Unsteadiness on feet: Secondary | ICD-10-CM | POA: Diagnosis not present

## 2023-03-05 DIAGNOSIS — G20A1 Parkinson's disease without dyskinesia, without mention of fluctuations: Secondary | ICD-10-CM | POA: Diagnosis not present

## 2023-03-05 DIAGNOSIS — R2681 Unsteadiness on feet: Secondary | ICD-10-CM | POA: Diagnosis not present

## 2023-03-06 DIAGNOSIS — Z23 Encounter for immunization: Secondary | ICD-10-CM | POA: Diagnosis not present

## 2023-03-10 DIAGNOSIS — R2681 Unsteadiness on feet: Secondary | ICD-10-CM | POA: Diagnosis not present

## 2023-03-10 DIAGNOSIS — G20A1 Parkinson's disease without dyskinesia, without mention of fluctuations: Secondary | ICD-10-CM | POA: Diagnosis not present

## 2023-03-11 DIAGNOSIS — K5900 Constipation, unspecified: Secondary | ICD-10-CM | POA: Diagnosis not present

## 2023-03-11 DIAGNOSIS — F339 Major depressive disorder, recurrent, unspecified: Secondary | ICD-10-CM | POA: Diagnosis not present

## 2023-03-11 DIAGNOSIS — G20B2 Parkinson's disease with dyskinesia, with fluctuations: Secondary | ICD-10-CM | POA: Diagnosis not present

## 2023-03-12 DIAGNOSIS — G20A1 Parkinson's disease without dyskinesia, without mention of fluctuations: Secondary | ICD-10-CM | POA: Diagnosis not present

## 2023-03-12 DIAGNOSIS — R2681 Unsteadiness on feet: Secondary | ICD-10-CM | POA: Diagnosis not present

## 2023-03-18 DIAGNOSIS — G20A1 Parkinson's disease without dyskinesia, without mention of fluctuations: Secondary | ICD-10-CM | POA: Diagnosis not present

## 2023-03-18 DIAGNOSIS — R2681 Unsteadiness on feet: Secondary | ICD-10-CM | POA: Diagnosis not present

## 2023-03-27 DIAGNOSIS — N401 Enlarged prostate with lower urinary tract symptoms: Secondary | ICD-10-CM | POA: Diagnosis not present

## 2023-03-27 DIAGNOSIS — G20B2 Parkinson's disease with dyskinesia, with fluctuations: Secondary | ICD-10-CM | POA: Diagnosis not present

## 2023-03-27 DIAGNOSIS — Z91148 Patient's other noncompliance with medication regimen for other reason: Secondary | ICD-10-CM | POA: Diagnosis not present

## 2023-03-30 ENCOUNTER — Encounter (HOSPITAL_COMMUNITY): Payer: Self-pay

## 2023-03-30 ENCOUNTER — Other Ambulatory Visit (HOSPITAL_COMMUNITY): Payer: Medicare Other

## 2023-03-30 ENCOUNTER — Emergency Department (HOSPITAL_COMMUNITY): Payer: Medicare Other

## 2023-03-30 ENCOUNTER — Inpatient Hospital Stay (HOSPITAL_COMMUNITY)
Admission: EM | Admit: 2023-03-30 | Discharge: 2023-04-06 | DRG: 871 | Disposition: A | Discharge status: Hospice | Payer: Medicare Other | Attending: Internal Medicine | Admitting: Internal Medicine

## 2023-03-30 ENCOUNTER — Other Ambulatory Visit: Payer: Self-pay

## 2023-03-30 DIAGNOSIS — I959 Hypotension, unspecified: Secondary | ICD-10-CM | POA: Diagnosis present

## 2023-03-30 DIAGNOSIS — Z7401 Bed confinement status: Secondary | ICD-10-CM | POA: Diagnosis not present

## 2023-03-30 DIAGNOSIS — Z8249 Family history of ischemic heart disease and other diseases of the circulatory system: Secondary | ICD-10-CM | POA: Diagnosis not present

## 2023-03-30 DIAGNOSIS — Z79899 Other long term (current) drug therapy: Secondary | ICD-10-CM

## 2023-03-30 DIAGNOSIS — N179 Acute kidney failure, unspecified: Secondary | ICD-10-CM | POA: Diagnosis not present

## 2023-03-30 DIAGNOSIS — E43 Unspecified severe protein-calorie malnutrition: Secondary | ICD-10-CM | POA: Diagnosis present

## 2023-03-30 DIAGNOSIS — I4510 Unspecified right bundle-branch block: Secondary | ICD-10-CM | POA: Diagnosis not present

## 2023-03-30 DIAGNOSIS — E870 Hyperosmolality and hypernatremia: Secondary | ICD-10-CM | POA: Diagnosis present

## 2023-03-30 DIAGNOSIS — R1310 Dysphagia, unspecified: Secondary | ICD-10-CM | POA: Diagnosis present

## 2023-03-30 DIAGNOSIS — Z66 Do not resuscitate: Secondary | ICD-10-CM | POA: Diagnosis present

## 2023-03-30 DIAGNOSIS — N138 Other obstructive and reflux uropathy: Secondary | ICD-10-CM | POA: Diagnosis not present

## 2023-03-30 DIAGNOSIS — E8729 Other acidosis: Secondary | ICD-10-CM | POA: Diagnosis not present

## 2023-03-30 DIAGNOSIS — E861 Hypovolemia: Secondary | ICD-10-CM

## 2023-03-30 DIAGNOSIS — A419 Sepsis, unspecified organism: Principal | ICD-10-CM | POA: Diagnosis present

## 2023-03-30 DIAGNOSIS — J690 Pneumonitis due to inhalation of food and vomit: Secondary | ICD-10-CM | POA: Diagnosis present

## 2023-03-30 DIAGNOSIS — G9341 Metabolic encephalopathy: Secondary | ICD-10-CM | POA: Diagnosis not present

## 2023-03-30 DIAGNOSIS — Z9181 History of falling: Secondary | ICD-10-CM

## 2023-03-30 DIAGNOSIS — F02C0 Dementia in other diseases classified elsewhere, severe, without behavioral disturbance, psychotic disturbance, mood disturbance, and anxiety: Secondary | ICD-10-CM | POA: Diagnosis present

## 2023-03-30 DIAGNOSIS — R404 Transient alteration of awareness: Secondary | ICD-10-CM | POA: Diagnosis not present

## 2023-03-30 DIAGNOSIS — Z7189 Other specified counseling: Secondary | ICD-10-CM | POA: Diagnosis not present

## 2023-03-30 DIAGNOSIS — R339 Retention of urine, unspecified: Secondary | ICD-10-CM | POA: Diagnosis not present

## 2023-03-30 DIAGNOSIS — R1311 Dysphagia, oral phase: Secondary | ICD-10-CM | POA: Diagnosis not present

## 2023-03-30 DIAGNOSIS — N170 Acute kidney failure with tubular necrosis: Secondary | ICD-10-CM | POA: Diagnosis present

## 2023-03-30 DIAGNOSIS — J9811 Atelectasis: Secondary | ICD-10-CM | POA: Diagnosis present

## 2023-03-30 DIAGNOSIS — R6520 Severe sepsis without septic shock: Secondary | ICD-10-CM | POA: Diagnosis not present

## 2023-03-30 DIAGNOSIS — N401 Enlarged prostate with lower urinary tract symptoms: Secondary | ICD-10-CM | POA: Diagnosis present

## 2023-03-30 DIAGNOSIS — Z515 Encounter for palliative care: Secondary | ICD-10-CM

## 2023-03-30 DIAGNOSIS — Z1152 Encounter for screening for COVID-19: Secondary | ICD-10-CM

## 2023-03-30 DIAGNOSIS — G20A1 Parkinson's disease without dyskinesia, without mention of fluctuations: Secondary | ICD-10-CM | POA: Diagnosis present

## 2023-03-30 DIAGNOSIS — Z803 Family history of malignant neoplasm of breast: Secondary | ICD-10-CM | POA: Diagnosis not present

## 2023-03-30 DIAGNOSIS — E872 Acidosis, unspecified: Secondary | ICD-10-CM | POA: Diagnosis present

## 2023-03-30 DIAGNOSIS — I10 Essential (primary) hypertension: Secondary | ICD-10-CM | POA: Diagnosis not present

## 2023-03-30 DIAGNOSIS — G20B2 Parkinson's disease with dyskinesia, with fluctuations: Secondary | ICD-10-CM | POA: Diagnosis not present

## 2023-03-30 DIAGNOSIS — R338 Other retention of urine: Secondary | ICD-10-CM | POA: Diagnosis not present

## 2023-03-30 DIAGNOSIS — N400 Enlarged prostate without lower urinary tract symptoms: Secondary | ICD-10-CM | POA: Diagnosis not present

## 2023-03-30 DIAGNOSIS — R918 Other nonspecific abnormal finding of lung field: Secondary | ICD-10-CM | POA: Diagnosis not present

## 2023-03-30 DIAGNOSIS — R000 Tachycardia, unspecified: Secondary | ICD-10-CM | POA: Diagnosis not present

## 2023-03-30 DIAGNOSIS — F0390 Unspecified dementia without behavioral disturbance: Secondary | ICD-10-CM | POA: Diagnosis not present

## 2023-03-30 DIAGNOSIS — R0902 Hypoxemia: Secondary | ICD-10-CM | POA: Diagnosis not present

## 2023-03-30 DIAGNOSIS — Z681 Body mass index (BMI) 19 or less, adult: Secondary | ICD-10-CM | POA: Diagnosis not present

## 2023-03-30 DIAGNOSIS — D72819 Decreased white blood cell count, unspecified: Secondary | ICD-10-CM | POA: Diagnosis present

## 2023-03-30 DIAGNOSIS — K869 Disease of pancreas, unspecified: Secondary | ICD-10-CM | POA: Diagnosis present

## 2023-03-30 DIAGNOSIS — E785 Hyperlipidemia, unspecified: Secondary | ICD-10-CM | POA: Diagnosis present

## 2023-03-30 DIAGNOSIS — G311 Senile degeneration of brain, not elsewhere classified: Secondary | ICD-10-CM | POA: Diagnosis not present

## 2023-03-30 DIAGNOSIS — N390 Urinary tract infection, site not specified: Secondary | ICD-10-CM | POA: Diagnosis present

## 2023-03-30 DIAGNOSIS — F32A Depression, unspecified: Secondary | ICD-10-CM | POA: Diagnosis not present

## 2023-03-30 DIAGNOSIS — R55 Syncope and collapse: Secondary | ICD-10-CM | POA: Diagnosis not present

## 2023-03-30 DIAGNOSIS — R4182 Altered mental status, unspecified: Secondary | ICD-10-CM | POA: Diagnosis not present

## 2023-03-30 DIAGNOSIS — Z96641 Presence of right artificial hip joint: Secondary | ICD-10-CM | POA: Diagnosis present

## 2023-03-30 LAB — URINALYSIS, W/ REFLEX TO CULTURE (INFECTION SUSPECTED)
Bilirubin Urine: NEGATIVE
Glucose, UA: NEGATIVE mg/dL
Ketones, ur: NEGATIVE mg/dL
Nitrite: NEGATIVE
Protein, ur: NEGATIVE mg/dL
Specific Gravity, Urine: 1.013 (ref 1.005–1.030)
WBC, UA: >50 WBC/hpf (ref 0–5)
pH: 5 (ref 5.0–8.0)

## 2023-03-30 LAB — CBC
HCT: 38.1 % — ABNORMAL LOW (ref 39.0–52.0)
Hemoglobin: 12 g/dL — ABNORMAL LOW (ref 13.0–17.0)
MCH: 29.8 pg (ref 26.0–34.0)
MCHC: 31.5 g/dL (ref 30.0–36.0)
MCV: 94.5 fL (ref 80.0–100.0)
Platelets: 140 10*3/uL — ABNORMAL LOW (ref 150–400)
RBC: 4.03 MIL/uL — ABNORMAL LOW (ref 4.22–5.81)
RDW: 14.8 % (ref 11.5–15.5)
WBC: 2.8 10*3/uL — ABNORMAL LOW (ref 4.0–10.5)
nRBC: 0 % (ref 0.0–0.2)

## 2023-03-30 LAB — HEPATIC FUNCTION PANEL
ALT: 13 U/L (ref 0–44)
AST: 44 U/L — ABNORMAL HIGH (ref 15–41)
Albumin: 2.5 g/dL — ABNORMAL LOW (ref 3.5–5.0)
Alkaline Phosphatase: 58 U/L (ref 38–126)
Bilirubin, Direct: 0.1 mg/dL (ref 0.0–0.2)
Indirect Bilirubin: 0.3 mg/dL (ref 0.3–0.9)
Total Bilirubin: 0.4 mg/dL (ref <1.2)
Total Protein: 6.2 g/dL — ABNORMAL LOW (ref 6.5–8.1)

## 2023-03-30 LAB — BASIC METABOLIC PANEL
Anion gap: 16 — ABNORMAL HIGH (ref 5–15)
BUN: 122 mg/dL — ABNORMAL HIGH (ref 8–23)
CO2: 16 mmol/L — ABNORMAL LOW (ref 22–32)
Calcium: 8.7 mg/dL — ABNORMAL LOW (ref 8.9–10.3)
Chloride: 110 mmol/L (ref 98–111)
Creatinine, Ser: 4.68 mg/dL — ABNORMAL HIGH (ref 0.61–1.24)
GFR, Estimated: 12 mL/min — ABNORMAL LOW (ref >60)
Glucose, Bld: 194 mg/dL — ABNORMAL HIGH (ref 70–99)
Potassium: 4.2 mmol/L (ref 3.5–5.1)
Sodium: 142 mmol/L (ref 135–145)

## 2023-03-30 LAB — DIFFERENTIAL
Abs Immature Granulocytes: 0.01 10*3/uL (ref 0.00–0.07)
Basophils Absolute: 0 10*3/uL (ref 0.0–0.1)
Basophils Relative: 0 %
Eosinophils Absolute: 0 10*3/uL (ref 0.0–0.5)
Eosinophils Relative: 1 %
Immature Granulocytes: 0 %
Lymphocytes Relative: 10 %
Lymphs Abs: 0.3 10*3/uL — ABNORMAL LOW (ref 0.7–4.0)
Monocytes Absolute: 0.7 10*3/uL (ref 0.1–1.0)
Monocytes Relative: 25 %
Neutro Abs: 1.8 10*3/uL (ref 1.7–7.7)
Neutrophils Relative %: 64 %

## 2023-03-30 LAB — RESP PANEL BY RT-PCR (RSV, FLU A&B, COVID)  RVPGX2
Influenza A by PCR: NEGATIVE
Influenza B by PCR: NEGATIVE
Resp Syncytial Virus by PCR: NEGATIVE
SARS Coronavirus 2 by RT PCR: NEGATIVE

## 2023-03-30 LAB — PROTIME-INR
INR: 1.3 — ABNORMAL HIGH (ref 0.8–1.2)
Prothrombin Time: 16.4 s — ABNORMAL HIGH (ref 11.4–15.2)

## 2023-03-30 LAB — APTT: aPTT: 31 s (ref 24–36)

## 2023-03-30 LAB — I-STAT CG4 LACTIC ACID, ED
Lactic Acid, Venous: 3.3 mmol/L (ref 0.5–1.9)
Lactic Acid, Venous: 1.8 mmol/L (ref 0.5–1.9)

## 2023-03-30 LAB — CBG MONITORING, ED: Glucose-Capillary: 158 mg/dL — ABNORMAL HIGH (ref 70–99)

## 2023-03-30 MED ORDER — SODIUM CHLORIDE 0.9 % IV SOLN
1.0000 g | INTRAVENOUS | Status: DC
  Administered 2023-03-31 – 2023-04-01 (×2): 1 g via INTRAVENOUS
  Filled 2023-03-30 (×2): qty 10

## 2023-03-30 MED ORDER — LACTATED RINGERS IV BOLUS (SEPSIS)
1000.0000 mL | Freq: Once | INTRAVENOUS | Status: AC
  Administered 2023-03-30: 1000 mL via INTRAVENOUS

## 2023-03-30 MED ORDER — ENOXAPARIN SODIUM 30 MG/0.3ML IJ SOSY
30.0000 mg | PREFILLED_SYRINGE | INTRAMUSCULAR | Status: DC
  Administered 2023-03-30 – 2023-04-05 (×7): 30 mg via SUBCUTANEOUS
  Filled 2023-03-30 (×7): qty 0.3

## 2023-03-30 MED ORDER — DEXTROSE 5 % IV SOLN
500.0000 mg | INTRAVENOUS | Status: DC
  Administered 2023-03-30 – 2023-04-01 (×3): 500 mg via INTRAVENOUS
  Filled 2023-03-30: qty 5

## 2023-03-30 MED ORDER — VANCOMYCIN HCL 1250 MG/250ML IV SOLN
1250.0000 mg | Freq: Once | INTRAVENOUS | Status: AC
  Administered 2023-03-30: 1250 mg via INTRAVENOUS
  Filled 2023-03-30: qty 250

## 2023-03-30 MED ORDER — METRONIDAZOLE 500 MG/100ML IV SOLN
500.0000 mg | Freq: Once | INTRAVENOUS | Status: AC
  Administered 2023-03-30: 500 mg via INTRAVENOUS
  Filled 2023-03-30: qty 100

## 2023-03-30 MED ORDER — VANCOMYCIN HCL IN DEXTROSE 1-5 GM/200ML-% IV SOLN: 1000.0000 mg | Freq: Once | INTRAVENOUS | Status: DC

## 2023-03-30 MED ORDER — LACTATED RINGERS IV SOLN: INTRAVENOUS | Status: DC

## 2023-03-30 MED ORDER — SODIUM CHLORIDE 0.9 % IV SOLN
2.0000 g | Freq: Once | INTRAVENOUS | Status: AC
  Administered 2023-03-30: 2 g via INTRAVENOUS
  Filled 2023-03-30: qty 12.5

## 2023-03-30 NOTE — Assessment & Plan Note (Signed)
-   Secondary to hypotension and acute renal failure - Continue IV fluids

## 2023-03-30 NOTE — ED Notes (Signed)
Phlebotomy at bedside for cultures

## 2023-03-30 NOTE — Assessment & Plan Note (Signed)
-   Secondary to sepsis with community-acquired pneumonia versus UTI - Continue on IV antibiotics and follow clinically

## 2023-03-30 NOTE — ED Notes (Signed)
ED TO INPATIENT HANDOFF REPORT  ED Nurse Name and Phone #: Jeannett Senior [phone]  S Name/Age/Gender Lee Martinez 87 y.o. male Room/Bed: 017C/017C  Code Status   Code Status: Limited: Do not attempt resuscitation (DNR) -DNR-LIMITED -Do Not Intubate/DNI   Home/SNF/Other Nursing Home Patient oriented to: self, place, time, and situation Is this baseline? Yes   Triage Complete: Triage complete  Chief Complaint Sepsis [facility]) [A41.9]  Triage Note [facility] reports pt coming from [facility] nursing. Pt got up to go to bathroom and when he came back to bed he was unresponsive but breathing. Upon EMS arrival pt still unresponsive with weak radials.    Allergies No Known Allergies  Level of Care/Admitting Diagnosis ED Disposition     ED Disposition  Admit   Condition  --   Comment  Hospital [facility]: [facility] [[id number]]  Level of Care: Telemetry Medical [104]  May admit patient to [facility] or [facility] if equivalent level of care is available:: No  Covid Evaluation: Asymptomatic - no recent exposure (last 10 days) testing not required  Diagnosis: Sepsis [facility]) [[id number]]  Admitting Physician: Anselm Jungling [[id number]]  Attending Physician: Anselm Jungling [[id number]]  Certification:: I certify this patient will need inpatient services for at least 2 midnights  Expected Medical Readiness: 04/02/2023          B Medical/Surgery History Past Medical History:  Diagnosis Date   Parkinson's disease (HCC) 07/2011   followed by [facility]   Past Surgical History:  Procedure Laterality Date   basil removal     HERNIA REPAIR     THYROID SURGERY     TOTAL HIP ARTHROPLASTY Right 05/21/2022   Procedure: TOTAL HIP ARTHROPLASTY ANTERIOR APPROACH;  Surgeon: Durene Romans, MD;  Location: [facility];  Service: Orthopedics;  Laterality: Right;     A IV Location/Drains/Wounds Patient Lines/Drains/Airways Status     Active Line/Drains/Airways     Name Placement date Placement  time Site Days   Peripheral IV 03/30/23 20 G 1" Anterior;Distal;Left;Upper Arm 03/30/23  1613  Arm  less than 1   Urethral Catheter Latex 16 Fr. 03/30/23  1737  Latex  less than 1            Intake/Output Last 24 hours  Intake/Output Summary (Last 24 hours) at 03/30/2023 2128 Last data filed at 03/30/2023 1850 Gross per 24 hour  Intake --  Output 1100 ml  Net -1100 ml    Labs/Imaging Results for orders placed or performed during the hospital encounter of 03/30/23 (from the past 48 hour(s))  Urinalysis, w/ Reflex to Culture (Infection Suspected) -Urine, Catheterized     Status: Abnormal   Collection Time: 03/30/23  4:55 PM  Result Value Ref Range   Specimen Source URINE, CATHETERIZED    Color, Urine YELLOW YELLOW   APPearance HAZY (A) CLEAR   Specific Gravity, Urine 1.013 1.005 - 1.030   pH 5.0 5.0 - 8.0   Glucose, UA NEGATIVE NEGATIVE mg/dL   Hgb urine dipstick MODERATE (A) NEGATIVE   Bilirubin Urine NEGATIVE NEGATIVE   Ketones, ur NEGATIVE NEGATIVE mg/dL   Protein, ur NEGATIVE NEGATIVE mg/dL   Nitrite NEGATIVE NEGATIVE   Leukocytes,Ua LARGE (A) NEGATIVE   RBC / HPF 21-50 0 - 5 RBC/hpf   WBC, UA >50 0 - 5 WBC/hpf    Comment:        Reflex urine culture not performed if WBC <=10, OR if Squamous epithelial cells >5. If Squamous epithelial cells >  5 suggest recollection.    Bacteria, UA RARE (A) NONE SEEN   Squamous Epithelial / HPF 0-5 0 - 5 /HPF   Mucus PRESENT     Comment: Performed at [facility], [address] [place], [place] [address]  Basic metabolic panel     Status: Abnormal   Collection Time: 03/30/23  5:00 PM  Result Value Ref Range   Sodium 142 135 - 145 mmol/L   Potassium 4.2 3.5 - 5.1 mmol/L   Chloride 110 98 - 111 mmol/L   CO2 16 (L) 22 - 32 mmol/L   Glucose, Bld 194 (H) 70 - 99 mg/dL    Comment: Glucose reference range applies only to samples taken after fasting for at least 8 hours.   BUN 122 (H) 8 - 23 mg/dL   Creatinine, Ser 1.91 (H)  0.61 - 1.24 mg/dL   Calcium 8.7 (L) 8.9 - 10.3 mg/dL   GFR, Estimated 12 (L) >60 mL/min    Comment: (NOTE) Calculated using the CKD-EPI Creatinine Equation (2021)    Anion gap 16 (H) 5 - 15    Comment: Performed at [facility], [address] [place], [place] [address]  CBC     Status: Abnormal   Collection Time: 03/30/23  5:00 PM  Result Value Ref Range   WBC 2.8 (L) 4.0 - 10.5 K/uL   RBC 4.03 (L) 4.22 - 5.81 MIL/uL   Hemoglobin 12.0 (L) 13.0 - 17.0 g/dL   HCT 56.2 (L) 13.0 - 86.5 %   MCV 94.5 80.0 - 100.0 fL   MCH 29.8 26.0 - 34.0 pg   MCHC 31.5 30.0 - 36.0 g/dL   RDW 78.4 69.6 - 29.5 %   Platelets 140 (L) 150 - 400 K/uL   nRBC 0.0 0.0 - 0.2 %    Comment: Performed at [facility], [address] [place], [place] [address]  Protime-INR     Status: Abnormal   Collection Time: 03/30/23  5:00 PM  Result Value Ref Range   Prothrombin Time 16.4 (H) 11.4 - 15.2 seconds   INR 1.3 (H) 0.8 - 1.2    Comment: (NOTE) INR goal varies based on device and disease states. Performed at [facility], [address], [place], [place] [address]   APTT     Status: None   Collection Time: 03/30/23  5:00 PM  Result Value Ref Range   aPTT 31 24 - 36 seconds    Comment: Performed at [facility], [address] [place], [place] [address]  Differential     Status: Abnormal   Collection Time: 03/30/23  5:00 PM  Result Value Ref Range   Neutrophils Relative % 64 %   Neutro Abs 1.8 1.7 - 7.7 K/uL   Lymphocytes Relative 10 %   Lymphs Abs 0.3 (L) 0.7 - 4.0 K/uL   Monocytes Relative 25 %   Monocytes Absolute 0.7 0.1 - 1.0 K/uL   Eosinophils Relative 1 %   Eosinophils Absolute 0.0 0.0 - 0.5 K/uL   Basophils Relative 0 %   Basophils Absolute 0.0 0.0 - 0.1 K/uL   Immature Granulocytes 0 %   Abs Immature Granulocytes 0.01 0.00 - 0.07 K/uL    Comment: Performed at [facility], [address] [place], [place] [address]  Hepatic function panel     Status: Abnormal    Collection Time: 03/30/23  5:00 PM  Result Value Ref Range   Total  Protein 6.2 (L) 6.5 - 8.1 g/dL   Albumin 2.5 (L) 3.5 - 5.0 g/dL   AST 44 (H) 15 - 41 U/L   ALT 13 0 - 44 U/L   Alkaline Phosphatase 58 38 - 126 U/L   Total Bilirubin 0.4 <1.2 mg/dL   Bilirubin, Direct 0.1 0.0 - 0.2 mg/dL   Indirect Bilirubin 0.3 0.3 - 0.9 mg/dL    Comment: Performed at [facility], [address], [place], [place] [address]  Resp panel by RT-PCR (RSV, Flu A&B, Covid) Anterior Nasal Swab     Status: None   Collection Time: 03/30/23  5:13 PM   Specimen: Anterior Nasal Swab  Result Value Ref Range   SARS Coronavirus 2 by RT PCR NEGATIVE NEGATIVE   Influenza A by PCR NEGATIVE NEGATIVE   Influenza B by PCR NEGATIVE NEGATIVE    Comment: (NOTE) The Xpert Xpress SARS-CoV-2/FLU/RSV plus assay is intended as an aid in the diagnosis of influenza from Nasopharyngeal swab specimens and should not be used as a sole basis for treatment. Nasal washings and aspirates are unacceptable for Xpert Xpress SARS-CoV-2/FLU/RSV testing.  Fact Sheet for Patients: BloggerCourse.com  Fact Sheet for Healthcare Providers: SeriousBroker.it  This test is not yet approved or cleared by the [place] FDA and has been authorized for detection and/or diagnosis of SARS-CoV-2 by FDA under an Emergency Use Authorization (EUA). This EUA will remain in effect (meaning this test can be used) for the duration of the COVID-19 declaration under Section 564(b)(1) of the Act, 21 U.S.C. section 360bbb-3(b)(1), unless the authorization is terminated or revoked.     Resp Syncytial Virus by PCR NEGATIVE NEGATIVE    Comment: (NOTE) Fact Sheet for Patients: BloggerCourse.com  Fact Sheet for Healthcare Providers: SeriousBroker.it  This test is not yet approved or cleared by the [place] FDA and has been authorized for  detection and/or diagnosis of SARS-CoV-2 by FDA under an Emergency Use Authorization (EUA). This EUA will remain in effect (meaning this test can be used) for the duration of the COVID-19 declaration under Section 564(b)(1) of the Act, 21 U.S.C. section 360bbb-3(b)(1), unless the authorization is terminated or revoked.  Performed at [facility], [address], [place], [place] [address]   CBG monitoring, ED     Status: Abnormal   Collection Time: 03/30/23  6:18 PM  Result Value Ref Range   Glucose-Capillary 158 (H) 70 - 99 mg/dL    Comment: Glucose reference range applies only to samples taken after fasting for at least 8 hours.  I-Stat Lactic Acid, ED     Status: Abnormal   Collection Time: 03/30/23  6:19 PM  Result Value Ref Range   Lactic Acid, Venous 3.3 (HH) 0.5 - 1.9 mmol/L   Comment NOTIFIED PHYSICIAN   I-Stat Lactic Acid, ED     Status: None   Collection Time: 03/30/23  7:53 PM  Result Value Ref Range   Lactic Acid, Venous 1.8 0.5 - 1.9 mmol/L   CT Head Wo Contrast  Result Date: 03/30/2023 CLINICAL DATA:  Altered mental status. EXAM: CT HEAD WITHOUT CONTRAST TECHNIQUE: Contiguous axial images were obtained from the base of the skull through the vertex without intravenous contrast. RADIATION DOSE REDUCTION: This exam was performed according to the departmental dose-optimization program which includes automated exposure control, adjustment of the mA and/or kV according to patient size and/or use of iterative reconstruction technique. COMPARISON:  Head CT dated 08/18/2022. FINDINGS: Brain: Mild age-related atrophy and chronic microvascular  ischemic changes. There is no acute intracranial hemorrhage. No mass effect or midline shift. No extra-axial fluid collection. Vascular: No hyperdense vessel or unexpected calcification. Skull: Normal. Negative for fracture or focal lesion. Sinuses/Orbits: No acute finding. Other: None IMPRESSION: 1. No acute intracranial pathology. 2. Mild  age-related atrophy and chronic microvascular ischemic changes. Electronically Signed   By: Elgie Collard M.D.   On: 03/30/2023 19:53   DG Chest Port 1 View  Result Date: 03/30/2023 CLINICAL DATA:  Sepsis, unresponsive episode EXAM: PORTABLE CHEST 1 VIEW COMPARISON:  07/26/2022 FINDINGS: Single frontal view of the chest demonstrates an unremarkable cardiac silhouette. Increased density at the left lung base may reflect airspace disease or atelectasis. No effusion or pneumothorax. No acute bony abnormalities. IMPRESSION: 1. Left basilar consolidation consistent with atelectasis or airspace disease. Electronically Signed   By: Sharlet Salina M.D.   On: 03/30/2023 17:51    Pending Labs Unresulted Labs (From admission, onward)     Start     Ordered   03/31/23 0500  CBC  Tomorrow morning,   R        03/30/23 2120   03/31/23 0500  Basic metabolic panel  Tomorrow morning,   R        03/30/23 2120   03/30/23 1713  Blood Culture (routine x 2)  (Septic presentation on arrival (screening labs, nursing and treatment orders for obvious sepsis))  BLOOD CULTURE X 2,   STAT      03/30/23 1714   03/30/23 1655  Urine Culture  Once,   R        03/30/23 1655            Vitals/Pain Today's Vitals   03/30/23 1948 03/30/23 2000 03/30/23 2016 03/30/23 2030  BP: 92/69 107/73  100/74  Pulse: 93 92  96  Resp: 14 16  15   Temp:   97.7 F (36.5 C)   TempSrc:   Axillary   SpO2: 100% 100%  99%  PainSc:        Isolation Precautions No active isolations  Medications Medications  lactated ringers infusion ( Intravenous New Bag/Given 03/30/23 1742)  vancomycin (VANCOREADY) IVPB 1250 mg/250 mL (1,250 mg Intravenous New Bag/Given 03/30/23 2019)  enoxaparin (LOVENOX) injection 30 mg (has no administration in time range)  cefTRIAXone (ROCEPHIN) 1 g in sodium chloride 0.9 % 100 mL IVPB (has no administration in time range)  azithromycin (ZITHROMAX) 500 mg in dextrose 5 % 250 mL IVPB (has no administration  in time range)  lactated ringers bolus 1,000 mL (0 mLs Intravenous Stopped 03/30/23 1850)    And  lactated ringers bolus 1,000 mL (0 mLs Intravenous Stopped 03/30/23 2008)  ceFEPIme (MAXIPIME) 2 g in sodium chloride 0.9 % 100 mL IVPB (0 g Intravenous Stopped 03/30/23 1850)  metroNIDAZOLE (FLAGYL) IVPB 500 mg (0 mg Intravenous Stopped 03/30/23 2008)    Mobility walks with device     Focused Assessments See provider note.   R Recommendations: See Admitting Provider Note  Report given to:   Additional Notes: Pt A&Ox4, uses walker for short distances in room to the bathroom but uses wheelchair when going further distances like to the cafeteria.

## 2023-03-30 NOTE — ED Notes (Signed)
Patient transported to CT 

## 2023-03-30 NOTE — Assessment & Plan Note (Deleted)
-   Indwelling Foley catheter placed in ED due to urine retention

## 2023-03-30 NOTE — Assessment & Plan Note (Signed)
-  noted on CT imaging in March 2024 -Currently follows with [facility] and has CT pancreas protocol abdomen planned since pt not likely to lay flat for MRI/MRCP due to parkinson's disease

## 2023-03-30 NOTE — Assessment & Plan Note (Signed)
-   Secondary to community-acquired pneumonia versus UTI -Unable to view chest x-ray imaging in epic but read out reported left basilar disease atelectasis versus infection.  Patient also reports increasing shortness of breath today with cough. -UA also shows concerns of large leukocyte, rare bacteria and greater than 50 WBC.  He also has urinary retention in ED although has history of BPH. -.  Has received IV vancomycin, cefepime and Flagyl in ED.  Will continue Rocephin and azithromycin pending urine culture

## 2023-03-30 NOTE — ED Provider Notes (Signed)
[place] EMERGENCY DEPARTMENT AT [facility] Provider Note   CSN: [id number] Arrival date & time: 03/30/23  1610     History  Chief Complaint  Patient presents with   Loss of Consciousness    Lee Martinez is a 87 y.o. male.  Patient brought in by EMS from [facility] nursing facility.  Patient has Parkinson's does have some mental status changes associated with that.  Family states that he usually walks with a walker but the last few days he has been struggling more with that and there have been some histories of some falls.  Patient got up to go to the bathroom when he came back to bed he was unresponsive but breathing.  EMS said he was still unresponsive had weak radial pulses.  Blood pressure initially upon arrival was 101.  He did go down to 79 systolic.  Heart rate was in the 100s and temp was 96.6.  Patient is a DNR.  Patient is also known to have a pancreatic mass from the CT scan back in March which has not been addressed.  Patient's also had thyroid surgery in the past patient is not on blood thinners.  Patient is on Flomax because he does have an enlarged prostate.  Patient is not on any thyroid medicine.       Home Medications Prior to Admission medications   Medication Sig Start Date End Date Taking? Authorizing Provider  acetaminophen (TYLENOL) 500 MG tablet Take 1 tablet (500 mg total) by mouth every 6 (six) hours as needed. 02/16/22   Sloan Leiter, DO  ARIPiprazole (ABILIFY) 2 MG tablet Take 2 mg by mouth daily. 07/09/22   [provider]  atorvastatin (LIPITOR) 10 MG tablet Take 1 tab daily in evening MWF Patient taking differently: Take 10 mg by mouth. Monday Wednesday Friday 02/07/22   Arnette Felts, FNP  carbidopa-levodopa (SINEMET IR) 25-100 MG tablet Take 3 tablets by mouth 3 (three) times daily. 05/28/22   Shalhoub, Deno Lunger, MD  docusate sodium (COLACE) 100 MG capsule Take 1 capsule (100 mg total) by mouth 2 (two) times daily. 05/28/22    Shalhoub, Deno Lunger, MD  ferrous sulfate 325 (65 FE) MG tablet Take 1 tablet (325 mg total) by mouth daily before breakfast. Take with a cup of orange juice or juice if possible 05/29/22   Shalhoub, Deno Lunger, MD  lactose free nutrition (BOOST PLUS) LIQD Take 237 mLs by mouth 3 (three) times daily between meals. 05/28/22   Shalhoub, Deno Lunger, MD  lidocaine (LIDODERM) 5 % Place 1 patch onto the skin daily. Remove & Discard patch within 12 hours or as directed by MD 08/18/22   Linwood Dibbles, MD  methocarbamol (ROBAXIN) 500 MG tablet Take 1 tablet (500 mg total) by mouth every 6 (six) hours as needed for muscle spasms. 05/22/22   Cassandria Anger, PA-C  multivitamin (PROSIGHT) TABS tablet Take 1 tablet by mouth daily. 05/29/22   Shalhoub, Deno Lunger, MD  ondansetron (ZOFRAN) 4 MG tablet Take 1 tablet (4 mg total) by mouth every 6 (six) hours as needed for nausea. 05/28/22   Shalhoub, Deno Lunger, MD  polyethylene glycol (MIRALAX / GLYCOLAX) 17 g packet Take 17 g by mouth daily. Do not give if patient is experiencing loose stools, diarrhea or frequent stools that day 05/28/22   Marinda Elk, MD  sertraline (ZOLOFT) 50 MG tablet Take 1 tablet (50 mg total) by mouth daily. 02/07/22 02/07/23  Arnette Felts, FNP  tamsulosin (FLOMAX) 0.4 MG CAPS capsule Take 1 capsule (0.4 mg total) by mouth daily. 05/29/22   Shalhoub, Deno Lunger, MD      Allergies    Patient has no known allergies.    Review of Systems   Review of Systems  Unable to perform ROS: Mental status change    Physical Exam Updated Vital Signs BP 100/74   Pulse 96   Temp 97.7 F (36.5 C) (Axillary)   Resp 15   SpO2 99%  Physical Exam Vitals and nursing note reviewed.  Constitutional:      General: He is not in acute distress.    Appearance: Normal appearance. He is well-developed.  HENT:     Head: Normocephalic and atraumatic.     Mouth/Throat:     Mouth: Mucous membranes are dry.  Eyes:     Extraocular Movements: Extraocular movements intact.      Conjunctiva/sclera: Conjunctivae normal.     Pupils: Pupils are equal, round, and reactive to light.  Cardiovascular:     Rate and Rhythm: Normal rate and regular rhythm.     Heart sounds: No murmur heard. Pulmonary:     Effort: Pulmonary effort is normal. No respiratory distress.     Breath sounds: Normal breath sounds.  Abdominal:     General: There is no distension.     Palpations: Abdomen is soft.     Tenderness: There is no abdominal tenderness. There is no guarding.     Comments: Bladder palpable seems to be enlarged.  Musculoskeletal:        General: No swelling.     Cervical back: Normal range of motion and neck supple.     Right lower leg: No edema.     Left lower leg: No edema.  Skin:    General: Skin is warm and dry.     Capillary Refill: Capillary refill takes less than 2 seconds.  Neurological:     Mental Status: He is alert.     Comments: Patient will wake up.  But definitely has depressed mental status.  Psychiatric:        Mood and Affect: Mood normal.     ED Results / Procedures / Treatments   Labs (all labs ordered are listed, but only abnormal results are displayed) Labs Reviewed  BASIC METABOLIC PANEL - Abnormal; Notable for the following components:      Result Value   CO2 16 (*)    Glucose, Bld 194 (*)    BUN 122 (*)    Creatinine, Ser 4.68 (*)    Calcium 8.7 (*)    GFR, Estimated 12 (*)    Anion gap 16 (*)    All other components within normal limits  CBC - Abnormal; Notable for the following components:   WBC 2.8 (*)    RBC 4.03 (*)    Hemoglobin 12.0 (*)    HCT 38.1 (*)    Platelets 140 (*)    All other components within normal limits  PROTIME-INR - Abnormal; Notable for the following components:   Prothrombin Time 16.4 (*)    INR 1.3 (*)    All other components within normal limits  URINALYSIS, W/ REFLEX TO CULTURE (INFECTION SUSPECTED) - Abnormal; Notable for the following components:   APPearance HAZY (*)    Hgb urine dipstick  MODERATE (*)    Leukocytes,Ua LARGE (*)    Bacteria, UA RARE (*)    All other components within normal limits  DIFFERENTIAL - Abnormal; Notable  for the following components:   Lymphs Abs 0.3 (*)    All other components within normal limits  HEPATIC FUNCTION PANEL - Abnormal; Notable for the following components:   Total Protein 6.2 (*)    Albumin 2.5 (*)    AST 44 (*)    All other components within normal limits  CBG MONITORING, ED - Abnormal; Notable for the following components:   Glucose-Capillary 158 (*)    All other components within normal limits  I-STAT CG4 LACTIC ACID, ED - Abnormal; Notable for the following components:   Lactic Acid, Venous 3.3 (*)    All other components within normal limits  RESP PANEL BY RT-PCR (RSV, FLU A&B, COVID)  RVPGX2  CULTURE, BLOOD (ROUTINE X 2)  CULTURE, BLOOD (ROUTINE X 2)  URINE CULTURE  APTT  I-STAT CG4 LACTIC ACID, ED    EKG EKG Interpretation Date/Time:  Sunday March 30 2023 16:18:40 EST Ventricular Rate:  103 PR Interval:  147 QRS Duration:  109 QT Interval:  353 QTC Calculation: 463 R Axis:   -26  Text Interpretation: Sinus tachycardia RSR' in V1 or V2, probably normal variant Inferior infarct, old Confirmed by Vanetta Mulders [phone]) on 03/30/2023 5:12:34 PM  Radiology CT Head Wo Contrast  Result Date: 03/30/2023 CLINICAL DATA:  Altered mental status. EXAM: CT HEAD WITHOUT CONTRAST TECHNIQUE: Contiguous axial images were obtained from the base of the skull through the vertex without intravenous contrast. RADIATION DOSE REDUCTION: This exam was performed according to the departmental dose-optimization program which includes automated exposure control, adjustment of the mA and/or kV according to patient size and/or use of iterative reconstruction technique. COMPARISON:  Head CT dated 08/18/2022. FINDINGS: Brain: Mild age-related atrophy and chronic microvascular ischemic changes. There is no acute intracranial hemorrhage. No  mass effect or midline shift. No extra-axial fluid collection. Vascular: No hyperdense vessel or unexpected calcification. Skull: Normal. Negative for fracture or focal lesion. Sinuses/Orbits: No acute finding. Other: None IMPRESSION: 1. No acute intracranial pathology. 2. Mild age-related atrophy and chronic microvascular ischemic changes. Electronically Signed   By: Elgie Collard M.D.   On: 03/30/2023 19:53   DG Chest Port 1 View  Result Date: 03/30/2023 CLINICAL DATA:  Sepsis, unresponsive episode EXAM: PORTABLE CHEST 1 VIEW COMPARISON:  07/26/2022 FINDINGS: Single frontal view of the chest demonstrates an unremarkable cardiac silhouette. Increased density at the left lung base may reflect airspace disease or atelectasis. No effusion or pneumothorax. No acute bony abnormalities. IMPRESSION: 1. Left basilar consolidation consistent with atelectasis or airspace disease. Electronically Signed   By: Sharlet Salina M.D.   On: 03/30/2023 17:51    Procedures Procedures    Medications Ordered in ED Medications  lactated ringers infusion ( Intravenous New Bag/Given 03/30/23 1742)  vancomycin (VANCOREADY) IVPB 1250 mg/250 mL (1,250 mg Intravenous New Bag/Given 03/30/23 2019)  lactated ringers bolus 1,000 mL (0 mLs Intravenous Stopped 03/30/23 1850)    And  lactated ringers bolus 1,000 mL (0 mLs Intravenous Stopped 03/30/23 2008)  ceFEPIme (MAXIPIME) 2 g in sodium chloride 0.9 % 100 mL IVPB (0 g Intravenous Stopped 03/30/23 1850)  metroNIDAZOLE (FLAGYL) IVPB 500 mg (0 mg Intravenous Stopped 03/30/23 2008)    ED Course/ Medical Decision Making/ A&P                                 Medical Decision Making Amount and/or Complexity of Data Reviewed Labs: ordered. Radiology: ordered. ECG/medicine tests: ordered.  Risk Prescription drug management. Decision regarding hospitalization.   Patient seems to have concerns for possible sepsis.  Blood pressures did drop down to 79.  Temperature was  down some and he was tachycardic.  Plus family was stating that the urine was fairly foul-smelling the last few days.  Sepsis protocol initiated on him initial lactic acid was 3.3 repeat after fluids was 1.8.  Blood cultures pending.  Patient's basic metabolic panel definitely consistent with acute kidney injury GFR is 12 creatinine 4.68 and patient did look clinically dry.  Electrolytes otherwise normal.  White blood cell count 2.8 hemoglobin 12 and platelets were 140.  Patient's urinalysis patient did have Foley catheter placed we got over 800 out.  So we will leave that in place.  The fullness in his bladder did go down after that.  Urinalysis 21-50 reds and greater than 50 whites bacteria was rare though.  Culture sent.  CT head without any acute findings.  And chest x-ray raise some concerns about left basilar consolidation consistent with atelectasis or airspace disease.  Patient's oxygen saturations here have been good.  CRITICAL CARE Performed by: Vanetta Mulders Total critical care time: 45 minutes Critical care time was exclusive of separately billable procedures and treating other patients. Critical care was necessary to treat or prevent imminent or life-threatening deterioration. Critical care was time spent personally by me on the following activities: development of treatment plan with patient and/or surrogate as well as nursing, discussions with consultants, evaluation of patient's response to treatment, examination of patient, obtaining history from patient or surrogate, ordering and performing treatments and interventions, ordering and review of laboratory studies, ordering and review of radiographic studies, pulse oximetry and re-evaluation of patient's condition.  Patient's blood pressures are good now systolics are 107.  Heart rates come down into the 90s.  Discussed with hospitalist who will admit for sepsis and acute kidney injury.  Urinary tract infection most likely  caused  Final Clinical Impression(s) / ED Diagnoses Final diagnoses:  Sepsis, due to unspecified organism, unspecified whether acute organ dysfunction present [facility])  Urinary retention  Acute kidney injury (AKI) with acute tubular necrosis (ATN) (HCC)    Rx / DC Orders ED Discharge Orders     None         Vanetta Mulders, MD 03/30/23 2055

## 2023-03-30 NOTE — H&P (Signed)
History and Physical    Patient: Lee Martinez ZOX:[id number] DOB: Oct 02, 1935 DOA: 03/30/2023 DOS: the patient was seen and examined on 03/30/2023 PCP: Pcp, No  Patient coming from: SN[facility]  Chief Complaint:  Chief Complaint  Patient presents with   Loss of Consciousness   HPI: Lee Martinez is a 87 y.o. male with medical history significant of parkinson's disease/advanced dementia, BPH, HLD, depression, pancreatic lesion who presents with AMS.   Grandson at bedside provides history.  Patient had friends visiting him at his facility today and he complained of shortness of breath while in the bathroom.  Patient reports coughing for the past few days.  Has trouble swallowing if food is dry.  Family also noticed that his urine has been darker in the last few days.  He has trouble staying hydrated.  Has had less urine output.   On arrival to ED, he was afebrile but tachycardic HR 103, BP trended down to 79/53 but improving with fluid.  CBC with leukopenia on 2.8, hgb of 12, plt of 140. Lactate of 3.3.   CMP notable for acute renal failure with creatinine of 4.68, anion gap of 16, CO2 of 16.  AST is mildly elevated but other LFTs otherwise normal.  UA with large leukocyte, negative nitrite, rare bacteria and greater than 50 WBC.  Patient also noted to have urinary retention and had Foley catheter placed in ED with 800 cc urine output.  Chest x-ray read out left basilar consolidation consistent with atelectasis or airspace disease.  Unfortunately unable to view imaging in epic.  CT head is negative.  He was started on broad-spectrum antibiotics with IV vancomycin, Flagyl and cefepime during his sepsis workup.  Hospitalist then consulted for management of possible acute mental status secondary to UTI.    Review of Systems: unable to review all systems due to the inability of the patient to answer questions. Past Medical History:  Diagnosis Date   Parkinson's disease (HCC) 07/2011    followed by [facility]   Past Surgical History:  Procedure Laterality Date   basil removal     HERNIA REPAIR     THYROID SURGERY     TOTAL HIP ARTHROPLASTY Right 05/21/2022   Procedure: TOTAL HIP ARTHROPLASTY ANTERIOR APPROACH;  Surgeon: Durene Romans, MD;  Location: [facility] ORS;  Service: Orthopedics;  Laterality: Right;   Social History:  reports that he has never smoked. He has never used smokeless tobacco. He reports that he does not currently use alcohol after a past usage of about 1.0 standard drink of alcohol per week. He reports that he does not use drugs.  No Known Allergies  Family History  Problem Relation Age of Onset   Heart failure Mother    Breast cancer Sister     Prior to Admission medications   Medication Sig Start Date End Date Taking? Authorizing Provider  acetaminophen (TYLENOL) 500 MG tablet Take 1 tablet (500 mg total) by mouth every 6 (six) hours as needed. 02/16/22   Sloan Leiter, DO  ARIPiprazole (ABILIFY) 2 MG tablet Take 2 mg by mouth daily. 07/09/22   [provider]  atorvastatin (LIPITOR) 10 MG tablet Take 1 tab daily in evening MWF Patient taking differently: Take 10 mg by mouth. Monday Wednesday Friday 02/07/22   Arnette Felts, FNP  carbidopa-levodopa (SINEMET IR) 25-100 MG tablet Take 3 tablets by mouth 3 (three) times daily. 05/28/22   Shalhoub, Deno Lunger, MD  docusate sodium (COLACE) 100 MG capsule Take  1 capsule (100 mg total) by mouth 2 (two) times daily. 05/28/22   Shalhoub, Deno Lunger, MD  ferrous sulfate 325 (65 FE) MG tablet Take 1 tablet (325 mg total) by mouth daily before breakfast. Take with a cup of orange juice or juice if possible 05/29/22   Shalhoub, Deno Lunger, MD  lactose free nutrition (BOOST PLUS) LIQD Take 237 mLs by mouth 3 (three) times daily between meals. 05/28/22   Shalhoub, Deno Lunger, MD  lidocaine (LIDODERM) 5 % Place 1 patch onto the skin daily. Remove & Discard patch within 12 hours or as directed by MD 08/18/22   Linwood Dibbles, MD  methocarbamol (ROBAXIN) 500 MG tablet Take 1 tablet (500 mg total) by mouth every 6 (six) hours as needed for muscle spasms. 05/22/22   Cassandria Anger, PA-C  multivitamin (PROSIGHT) TABS tablet Take 1 tablet by mouth daily. 05/29/22   Shalhoub, Deno Lunger, MD  ondansetron (ZOFRAN) 4 MG tablet Take 1 tablet (4 mg total) by mouth every 6 (six) hours as needed for nausea. 05/28/22   Shalhoub, Deno Lunger, MD  polyethylene glycol (MIRALAX / GLYCOLAX) 17 g packet Take 17 g by mouth daily. Do not give if patient is experiencing loose stools, diarrhea or frequent stools that day 05/28/22   Marinda Elk, MD  sertraline (ZOLOFT) 50 MG tablet Take 1 tablet (50 mg total) by mouth daily. 02/07/22 02/07/23  Arnette Felts, FNP  tamsulosin (FLOMAX) 0.4 MG CAPS capsule Take 1 capsule (0.4 mg total) by mouth daily. 05/29/22   Shalhoub, Deno Lunger, MD    Physical Exam: Vitals:   03/30/23 1948 03/30/23 2000 03/30/23 2016 03/30/23 2030  BP: 92/69 107/73  100/74  Pulse: 93 92  96  Resp: 14 16  15   Temp:   97.7 F (36.5 C)   TempSrc:   Axillary   SpO2: 100% 100%  99%   Constitutional: NAD, calm, comfortable, nontoxic appearing chronically ill elderly male lying in bed asleep.  Patient is hard of hearing.  He awoke to light touch and voice and was able to answer a few questions and follow simple commands but appeared very drowsy and would fall back asleep immediately. Eyes: lids and conjunctivae normal ENMT: Mucous membranes are moist.  Neck: normal, supple Respiratory: clear to auscultation bilaterally, no wheezing, no crackles. Normal respiratory effort. No accessory muscle use.  On 2 L but no documented hypoxia. Cardiovascular: Regular rate and rhythm, no murmurs / rubs / gallops. No extremity edema. Abdomen: Soft, nontender, moaned with palpation of midabdomen.   Musculoskeletal: no clubbing / cyanosis. No joint deformity upper and lower extremities.  Muscle wasting on all extremities. Skin: no rashes,  lesions, ulcers. No induration Neurologic: CN 2-12 grossly intact.  Unable to lift extremities off bed against gravity except for right upper extremity. Psychiatric: Normal mood.  Unable to fully assess due to lethargy. Data Reviewed:  See HPI  Assessment and Plan: * Sepsis (HCC) - Secondary to community-acquired pneumonia versus UTI -Unable to view chest x-ray imaging in epic but read out reported left basilar disease atelectasis versus infection.  Patient also reports increasing shortness of breath today with cough. -UA also shows concerns of large leukocyte, rare bacteria and greater than 50 WBC.  He also has urinary retention in ED although has history of BPH. -.  Has received IV vancomycin, cefepime and Flagyl in ED.  Will continue Rocephin and azithromycin pending urine culture  Acute metabolic encephalopathy - Secondary to sepsis with community-acquired pneumonia  versus UTI - Continue on IV antibiotics and follow clinically  Parkinson disease (HCC) - Will continue home meds pending updated MAR from facility -at baseline has shuffling gait and needs walker for assistance with ambulation -per grandson he is oriented to self, place and time at baseline. Unable to assess today due to increased lethargy.  Pancreatic lesion -noted on CT imaging in March 2024 -Currently follows with [facility] and has CT pancreas protocol abdomen planned since pt not likely to lay flat for MRI/MRCP due to parkinson's disease  Acute urinary retention Hx of BPH  -- Indwelling Foley catheter placed in ED due to urine retention and possible UTI -on IV abx pending urine culture  Increased anion gap metabolic acidosis - Secondary to hypotension and acute renal failure - Continue IV fluids  Hypotension - Secondary sepsis.  BP at one point down to 79/53.  Now normotensive with IV fluids - Keep on continuous IV fluids  Acute renal failure (ARF) (HCC) - Suspect dehydration and postobstructive  AKI -Patient with urinary retention with Foley catheter placed in ED and over 800 cc of output -Continue Foley and trend creatinine -On IV fluids -Renally dose all medication avoid contrast study      Advance Care Planning:   Code Status: Limited: Do not attempt resuscitation (DNR) -DNR-LIMITED -Do Not Intubate/DNI -has DNI documentation at bedside and also confirmed by grandson  Consults: None  Family Communication: Grandson bedside  Severity of Illness: The appropriate patient status for this patient is INPATIENT. Inpatient status is judged to be reasonable and necessary in order to provide the required intensity of service to ensure the patient's safety. The patient's presenting symptoms, physical exam findings, and initial radiographic and laboratory data in the context of their chronic comorbidities is felt to place them at high risk for further clinical deterioration. Furthermore, it is not anticipated that the patient will be medically stable for discharge from the hospital within 2 midnights of admission.   * I certify that at the point of admission it is my clinical judgment that the patient will require inpatient hospital care spanning beyond 2 midnights from the point of admission due to high intensity of service, high risk for further deterioration and high frequency of surveillance required.*  Author: Anselm Jungling, DO 03/30/2023 9:29 PM  For on call review [email or web address].

## 2023-03-30 NOTE — Assessment & Plan Note (Addendum)
-   Will continue home meds pending updated MAR from facility -at baseline has shuffling gait and needs walker for assistance with ambulation -per grandson he is oriented to self, place and time at baseline. Unable to assess today due to increased lethargy.

## 2023-03-30 NOTE — Assessment & Plan Note (Signed)
-   Suspect dehydration and postobstructive AKI -Patient with urinary retention with Foley catheter placed in ED and over 800 cc of output -Continue Foley and trend creatinine -On IV fluids -Renally dose all medication avoid contrast study

## 2023-03-30 NOTE — Assessment & Plan Note (Signed)
-   Secondary sepsis.  BP at one point down to 79/53.  Now normotensive with IV fluids - Keep on continuous IV fluids

## 2023-03-30 NOTE — Progress Notes (Signed)
Elink following for sepsis protocol. 

## 2023-03-30 NOTE — Progress Notes (Signed)
ED Pharmacy Antibiotic Sign Off An antibiotic consult was received from an ED provider for vancomycin and cefepime per pharmacy dosing for sepsis. A chart review was completed to assess appropriateness.   The following one time order(s) were placed:  Vancomycin 1250 mg IV x 1 Cefepime 2g Iv x 1  Further antibiotic and/or antibiotic pharmacy consults should be ordered by the admitting provider if indicated.   Thank you for allowing pharmacy to be a part of this patient's care.   Daylene Posey, [facility]  Clinical Pharmacist 03/30/23 5:19 PM

## 2023-03-30 NOTE — ED Notes (Signed)
Pt returned from CT °

## 2023-03-30 NOTE — Assessment & Plan Note (Signed)
Hx of BPH  -- Indwelling Foley catheter placed in ED due to urine retention and possible UTI -on IV abx pending urine culture

## 2023-03-30 NOTE — ED Triage Notes (Signed)
[facility] reports pt coming from [facility] nursing. Pt got up to go to bathroom and when he came back to bed he was unresponsive but breathing. Upon EMS arrival pt still unresponsive with weak radials.

## 2023-03-30 NOTE — ED Notes (Signed)
One set of cultures drawn

## 2023-03-31 DIAGNOSIS — G20A1 Parkinson's disease without dyskinesia, without mention of fluctuations: Secondary | ICD-10-CM | POA: Diagnosis not present

## 2023-03-31 DIAGNOSIS — N170 Acute kidney failure with tubular necrosis: Secondary | ICD-10-CM

## 2023-03-31 DIAGNOSIS — R339 Retention of urine, unspecified: Secondary | ICD-10-CM

## 2023-03-31 DIAGNOSIS — A419 Sepsis, unspecified organism: Secondary | ICD-10-CM | POA: Diagnosis not present

## 2023-03-31 LAB — CBC
HCT: 32.9 % — ABNORMAL LOW (ref 39.0–52.0)
Hemoglobin: 10.7 g/dL — ABNORMAL LOW (ref 13.0–17.0)
MCH: 30.6 pg (ref 26.0–34.0)
MCHC: 32.5 g/dL (ref 30.0–36.0)
MCV: 94 fL (ref 80.0–100.0)
Platelets: 107 10*3/uL — ABNORMAL LOW (ref 150–400)
RBC: 3.5 MIL/uL — ABNORMAL LOW (ref 4.22–5.81)
RDW: 14.7 % (ref 11.5–15.5)
WBC: 2.5 10*3/uL — ABNORMAL LOW (ref 4.0–10.5)
nRBC: 0 % (ref 0.0–0.2)

## 2023-03-31 LAB — BASIC METABOLIC PANEL
Anion gap: 7 (ref 5–15)
BUN: 66 mg/dL — ABNORMAL HIGH (ref 8–23)
CO2: 21 mmol/L — ABNORMAL LOW (ref 22–32)
Calcium: 8.3 mg/dL — ABNORMAL LOW (ref 8.9–10.3)
Chloride: 116 mmol/L — ABNORMAL HIGH (ref 98–111)
Creatinine, Ser: 1.76 mg/dL — ABNORMAL HIGH (ref 0.61–1.24)
GFR, Estimated: 37 mL/min — ABNORMAL LOW (ref >60)
Glucose, Bld: 114 mg/dL — ABNORMAL HIGH (ref 70–99)
Potassium: 3.8 mmol/L (ref 3.5–5.1)
Sodium: 144 mmol/L (ref 135–145)

## 2023-03-31 LAB — URINE CULTURE: Culture: NO GROWTH

## 2023-03-31 LAB — GLUCOSE, CAPILLARY
Glucose-Capillary: 136 mg/dL — ABNORMAL HIGH (ref 70–99)
Glucose-Capillary: 131 mg/dL — ABNORMAL HIGH (ref 70–99)

## 2023-03-31 MED ORDER — ARIPIPRAZOLE 2 MG PO TABS
2.0000 mg | ORAL_TABLET | Freq: Every day | ORAL | Status: DC
  Administered 2023-03-31 – 2023-04-06 (×6): 2 mg via ORAL
  Filled 2023-03-31 (×7): qty 1

## 2023-03-31 MED ORDER — SODIUM CHLORIDE 0.9 % IV SOLN: INTRAVENOUS | Status: AC

## 2023-03-31 MED ORDER — ACETAMINOPHEN 325 MG PO TABS
650.0000 mg | ORAL_TABLET | Freq: Once | ORAL | Status: AC
  Administered 2023-03-31: 650 mg via ORAL
  Filled 2023-03-31: qty 2

## 2023-03-31 MED ORDER — CHLORHEXIDINE GLUCONATE CLOTH 2 % EX PADS
6.0000 | MEDICATED_PAD | Freq: Every day | CUTANEOUS | Status: DC
  Administered 2023-03-31 – 2023-04-06 (×7): 6 via TOPICAL

## 2023-03-31 MED ORDER — HALOPERIDOL LACTATE 5 MG/ML IJ SOLN
1.0000 mg | INTRAMUSCULAR | Status: AC
  Administered 2023-03-31: 1 mg via INTRAVENOUS
  Filled 2023-03-31: qty 1

## 2023-03-31 MED ORDER — TAMSULOSIN HCL 0.4 MG PO CAPS
0.4000 mg | ORAL_CAPSULE | Freq: Every day | ORAL | Status: DC
  Administered 2023-03-31 – 2023-04-06 (×6): 0.4 mg via ORAL
  Filled 2023-03-31 (×6): qty 1

## 2023-03-31 MED ORDER — CARBIDOPA-LEVODOPA 25-100 MG PO TABS
3.0000 | ORAL_TABLET | Freq: Three times a day (TID) | ORAL | Status: DC
  Administered 2023-03-31 – 2023-04-06 (×17): 3 via ORAL
  Filled 2023-03-31 (×18): qty 3

## 2023-03-31 MED ORDER — POLYETHYLENE GLYCOL 3350 17 G PO PACK
17.0000 g | PACK | Freq: Every day | ORAL | Status: DC
  Administered 2023-03-31 – 2023-04-06 (×6): 17 g via ORAL
  Filled 2023-03-31 (×6): qty 1

## 2023-03-31 NOTE — Evaluation (Signed)
Occupational Therapy Evaluation Patient Details Name: Lee Martinez MRN: [id number] DOB: [birth year] Today's Date: 03/31/2023   History of Present Illness Lee Martinez is an 87 y.o. male  who presented with AMS. Found to have sepsis due to PNA, encephalopathy and AKI. PMHx: Parkinson disease/advanced dementia BPH depression   Clinical Impression   Lee Martinez was evaluated s/p the above admission list. He lives at ALF and has assist for all aspects of ADLs and mobility at baseline, per caregiver he is typically ambulatory with RW for short distances and has recently started to refuse his medication. Upon evaluation the pt was limited by baseline cognition, PD, weakness, unsteady balance and limited activity tolerance. Overall he needed max A +2 for bed mobility and to stand with RW 3x. Due to the deficits listed below the pt also needs mod-total A for all aspects of his ADLs. Pt will benefit from continued acute OT services and skilled inpatient follow up therapy, <3 hours/day.         If plan is discharge home, recommend the following: A lot of help with walking and/or transfers;A lot of help with bathing/dressing/bathroom;Two people to help with bathing/dressing/bathroom;Two people to help with walking and/or transfers;Assistance with cooking/housework;Assistance with feeding;Direct supervision/assist for medications management;Direct supervision/assist for financial management;Assist for transportation;Help with stairs or ramp for entrance;Supervision due to cognitive status    Functional Status Assessment  Patient has had a recent decline in their functional status and demonstrates the ability to make significant improvements in function in a reasonable and predictable amount of time.  Equipment Recommendations  None recommended by OT       Precautions / Restrictions Precautions Precautions: Fall Restrictions Weight Bearing Restrictions: No      Mobility Bed Mobility Overal bed mobility:  Needs Assistance Bed Mobility: Supine to Sit, Sit to Supine     Supine to sit: Max assist, +2 for safety/equipment, +2 for physical assistance Sit to supine: Total assist, +2 for safety/equipment, +2 for physical assistance   General bed mobility comments: caregiver present and assisting    Transfers Overall transfer level: Needs assistance Equipment used: Rolling walker (2 wheels) Transfers: Sit to/from Stand Sit to Stand: Max assist, +2 safety/equipment, +2 physical assistance           General transfer comment: 3x, tolerated standing <10 seconds each time      Balance Overall balance assessment: Needs assistance Sitting-balance support: Feet supported Sitting balance-Leahy Scale: Fair     Standing balance support: Bilateral upper extremity supported, During functional activity Standing balance-Leahy Scale: Poor                             ADL either performed or assessed with clinical judgement   ADL Overall ADL's : Needs assistance/impaired Eating/Feeding: NPO   Grooming: Moderate assistance;Sitting   Upper Body Bathing: Maximal assistance;Sitting   Lower Body Bathing: Total assistance;+2 for physical assistance;+2 for safety/equipment   Upper Body Dressing : Moderate assistance;Sitting   Lower Body Dressing: Total assistance;+2 for physical assistance;+2 for safety/equipment   Toilet Transfer: Maximal assistance;+2 for physical assistance;+2 for safety/equipment;Stand-pivot;Rolling walker (2 wheels)   Toileting- Clothing Manipulation and Hygiene: Total assistance;+2 for physical assistance;+2 for safety/equipment       Functional mobility during ADLs: Maximal assistance;+2 for safety/equipment;+2 for physical assistance General ADL Comments: limited by baseline dementia and PD. pt able to sit EOB unsupported and stand 3x     Vision Baseline Vision/History: 1 Wears glasses  Vision Assessment?: No apparent visual deficits     Perception  Perception: Not tested       Praxis Praxis: Not tested          Extremity/Trunk Assessment Upper Extremity Assessment Upper Extremity Assessment: Generalized weakness;Difficult to assess due to impaired cognition   Lower Extremity Assessment Lower Extremity Assessment: Defer to PT evaluation   Cervical / Trunk Assessment Cervical / Trunk Assessment: Kyphotic   Communication Communication Communication: Hearing impairment   Cognition Arousal: Alert Behavior During Therapy: Flat affect Overall Cognitive Status: History of cognitive impairments - at baseline           General Comments: dementia at baseline, able to state his name and birthday. continued to state "[facility]" despite orientation. Followed some simple commands with multimoddal cues to assist with mobility - per caregiver report, pt recently started refusing to take his medication     General Comments  VSS on RA, caregiver present            Home Living Family/patient expects to be discharged to:: Assisted living         Home Equipment: Rollator (4 wheels);Wheelchair - manual   Additional Comments: [facility]      Prior Functioning/Environment Prior Level of Function : Needs assist             Mobility Comments: amb short distance with RW, WC for longer distances ADLs Comments: needs assist for all aspects of ADLs due to weakness and cognition        OT Problem List: Decreased strength;Decreased range of motion;Decreased activity tolerance;Impaired balance (sitting and/or standing);Decreased cognition;Decreased safety awareness;Decreased knowledge of use of DME or AE;Decreased knowledge of precautions      OT Treatment/Interventions: Self-care/ADL training;Energy conservation;DME and/or AE instruction;Therapeutic activities;Balance training;Patient/family education    OT Goals(Current goals can be found in the care plan section) Acute Rehab OT Goals Patient Stated Goal: unable to  state OT Goal Formulation: With patient Time For Goal Achievement: 04/14/23 Potential to Achieve Goals: Good ADL Goals Pt Will Perform Grooming: with min assist;sitting Pt Will Perform Upper Body Dressing: with min assist;sitting Pt Will Transfer to Toilet: with mod assist;stand pivot transfer;bedside commode Additional ADL Goal #1: Pt will complete bed mobility with min A as a precursor to ADLs  OT Frequency: Min 1X/week       AM-PAC OT "6 Clicks" Daily Activity     Outcome Measure Help from another person eating meals?: Total Help from another person taking care of personal grooming?: A Lot Help from another person toileting, which includes using toliet, bedpan, or urinal?: Total Help from another person bathing (including washing, rinsing, drying)?: A Lot Help from another person to put on and taking off regular upper body clothing?: A Lot Help from another person to put on and taking off regular lower body clothing?: Total 6 Click Score: 9   End of Session Equipment Utilized During Treatment: Gait belt;Rolling walker (2 wheels) Nurse Communication: Mobility status  Activity Tolerance: Patient tolerated treatment well Patient left: in bed;with call bell/phone within reach;with bed alarm set;with family/visitor present  OT Visit Diagnosis: Unsteadiness on feet (R26.81);Other abnormalities of gait and mobility (R26.89);Muscle weakness (generalized) (M62.81)                Time: 1610-9604 OT Time Calculation (min): 24 min Charges:  OT General Charges $OT Visit: 1 Visit OT Evaluation $OT Eval Moderate Complexity: 1 Mod OT Treatments $Therapeutic Activity: 8-22 mins  Derenda Mis, OTR/L Acute Rehabilitation Services  Office [phone] Secure Chat Communication Preferred   Donia Pounds 03/31/2023, 2:33 PM

## 2023-03-31 NOTE — NC FL2 (Signed)
[place] MEDICAID FL2 LEVEL OF CARE FORM     IDENTIFICATION  Patient Name: Lee Martinez Birthdate: 12-21-1935 Sex: male Admission Date (Current Location): 03/30/2023  [facility] and [place] Number:  Producer, television/film/video and Address:  [facility], [address] [place], [place] [address]      Provider Number: [id number]  Attending Physician Name and Address:  Marinda Elk, MD  Relative Name and Phone Number:  [name] - [phone]    Current Level of Care: Hospital Recommended Level of Care: Skilled Nursing Facility Prior Approval Number:    Date Approved/Denied:  05/01/2023- Expires PASRR Number: [id number]  Discharge Plan: SNF    Current Diagnoses: Patient Active Problem List   Diagnosis Date Noted   Sepsis (HCC) 03/30/2023   Acute renal failure (ARF) (HCC) 03/30/2023   Hypotension 03/30/2023   Increased anion gap metabolic acidosis 03/30/2023   Acute urinary retention 03/30/2023   Pancreatic lesion 03/30/2023   Protein-calorie malnutrition, severe 05/26/2022   Pneumonia of left lower lobe due to infectious organism 05/26/2022   Acute metabolic encephalopathy 05/26/2022   Normocytic anemia 05/25/2022   BPH with urinary obstruction 05/23/2022   Mixed hyperlipidemia 05/22/2022   S/P total right hip arthroplasty 05/21/2022   Lesion of pelvic bone 05/20/2022   Closed displaced fracture of right femoral neck (HCC) 05/20/2022   Refusal of blood transfusions as patient is Jehovah's Witness 05/20/2022   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 05/20/2022   Major depressive disorder 06/17/2013   Mood disorder in conditions classified elsewhere 03/31/2013   Parkinson disease (HCC) 01/19/2013    Orientation RESPIRATION BLADDER Height & Weight     Self  O2 Indwelling catheter Weight: 64.9 kg Height:  6' (182.9 cm)  BEHAVIORAL SYMPTOMS/MOOD NEUROLOGICAL BOWEL NUTRITION STATUS      Continent  Diet (See Discharge Summary)  AMBULATORY STATUS COMMUNICATION OF NEEDS Skin   Extensive Assist Verbally                         Personal Care Assistance Level of Assistance  Bathing, Feeding, Dressing, Total care Bathing Assistance: Maximum assistance Feeding assistance: Limited assistance Dressing Assistance: Maximum assistance Total Care Assistance: Limited assistance   Functional Limitations Info  Sight, Hearing, Speech Sight Info: Impaired Hearing Info: Impaired Speech Info: Adequate    SPECIAL CARE FACTORS FREQUENCY  PT (By licensed PT), OT (By licensed OT)     PT Frequency: 5 x per week OT Frequency: 5 x per week            Contractures Contractures Info: Not present    Additional Factors Info  Code Status, Allergies, Psychotropic Code Status Info: DNR Allergies Info: NKDA Psychotropic Info: Abilify, Sinemet         Current Medications (03/31/2023):  This is the current hospital active medication list Current Facility-Administered Medications  Medication Dose Route Frequency Provider Last Rate Last Admin   0.9 %  sodium chloride infusion   Intravenous Continuous Marinda Elk, MD 125 mL/hr at 03/31/23 0958 New Bag at 03/31/23 0958   ARIPiprazole (ABILIFY) tablet 2 mg  2 mg Oral Daily Marinda Elk, MD   2 mg at 03/31/23 1039   azithromycin (ZITHROMAX) 500 mg in dextrose 5 % 250 mL IVPB  500 mg Intravenous Q24H Tu, Ching T, DO   Stopped at 03/31/23 0032   carbidopa-levodopa (SINEMET IR) 25-100 MG per tablet immediate release 3 tablet  3 tablet Oral TID Marinda Elk, MD   3 tablet at 03/31/23 1038   cefTRIAXone (ROCEPHIN) 1 g in sodium chloride 0.9 % 100 mL IVPB  1 g Intravenous Q24H Tu, Ching T, DO       Chlorhexidine Gluconate Cloth 2 % PADS 6 each  6 each Topical Daily Marinda Elk, MD   6 each at 03/31/23 1039   enoxaparin (LOVENOX) injection 30 mg  30 mg Subcutaneous Q24H Tu, Ching T, DO   30 mg at 03/30/23 2208    polyethylene glycol (MIRALAX / GLYCOLAX) packet 17 g  17 g Oral Daily Marinda Elk, MD   17 g at 03/31/23 1039   tamsulosin (FLOMAX) capsule 0.4 mg  0.4 mg Oral Daily Marinda Elk, MD   0.4 mg at 03/31/23 1038     Discharge Medications: Please see discharge summary for a list of discharge medications.  Relevant Imaging Results:  Relevant Lab Results:   Additional Information SSN: [id number]  Janae Bridgeman, RN

## 2023-03-31 NOTE — Progress Notes (Signed)
Initial Nutrition Assessment  DOCUMENTATION CODES:   Severe malnutrition in context of chronic illness  INTERVENTION:  Advance diet as medically applicable Follow for diet advancement   NUTRITION DIAGNOSIS:   Severe Malnutrition related to chronic illness as evidenced by severe muscle depletion, severe fat depletion.    GOAL:   Patient will meet greater than or equal to 90% of their needs    MONITOR:   Diet advancement, Weight trends  REASON FOR ASSESSMENT:   Malnutrition Screening Tool    ASSESSMENT: 87 y.o. M Admitted from long term care facility due to AMS. Current problem list: Sepsis secondary to CAP, acute metabolic encephalopathy secondary to sepsis.   PMH; parkinson's disease/advanced dementia, BPH, HLD, depression, pancreatic lesion . Patient not very alert at time of visit. Unable to obtain a good nutritional history at this time. Currently NPO related to alertness and encephalopathy.  Reported difficulty with eating solid foods. No current plans for SLP till patient is more alert to eat. RDN will continue to follow for diet advancement.   Admit weight: 64.9 kg Current weight: 64.9 kg  Weight history: 03/30/23 64.9 kg  02/12/23 64.5 kg  09/30/22 66.6 kg  07/26/22 64.5 kg  06/23/22 64.5 kg  05/20/22 62 kg      Average Meal Intake: NPO  Nutritionally Relevant Medications: Scheduled Meds:  ARIPiprazole  2 mg Oral Daily   carbidopa-levodopa  3 tablet Oral TID    Labs Reviewed    NUTRITION - FOCUSED PHYSICAL EXAM:  Flowsheet Row Most Recent Value  Orbital Region Severe depletion  Upper Arm Region Severe depletion  Thoracic and Lumbar Region Severe depletion  Buccal Region Severe depletion  Temple Region Severe depletion  Clavicle Bone Region Severe depletion  Clavicle and Acromion Bone Region Severe depletion  Scapular Bone Region Severe depletion  Dorsal Hand Severe depletion  Patellar Region Severe depletion  Anterior Thigh Region Severe  depletion  Posterior Calf Region Severe depletion  Edema (RD Assessment) None  Hair Reviewed  Eyes Reviewed  Mouth Unable to assess  Skin Reviewed  Nails Reviewed       Diet Order:   Diet Order             Diet NPO time specified Except for: Sips with Meds  Diet effective now                   EDUCATION NEEDS:   Not appropriate for education at this time  Skin:  Skin Assessment: Reviewed RN Assessment  Last BM:  PTA  Height:   Ht Readings from Last 1 Encounters:  03/30/23 6' (1.829 m)    Weight:   Wt Readings from Last 1 Encounters:  03/30/23 64.9 kg    Ideal Body Weight:     BMI:  Body mass index is 19.4 kg/m.  Estimated Nutritional Needs:   Kcal:  1950-2275kcal/d  Protein:  85-100g/d  Fluid:  31ml/kcal    Jamelle Haring RDN, LDN Clinical Dietitian  RDN pager # available on Amion

## 2023-03-31 NOTE — Evaluation (Signed)
Physical Therapy Evaluation Patient Details Name: Lee Martinez MRN: [id number] DOB: [birth year] Today's Date: 03/31/2023  History of Present Illness  Lee Martinez is an 87 y.o. male  who presented 11/17 with AMS. Found to have sepsis due to PNA, encephalopathy and AKI. PMHx: Parkinson disease/advanced dementia BPH depression.  Clinical Impression  Pt admitted with above diagnosis. Notes indicate pt was ambulatory with an assistive device at ALF PTA. Currently requires up to mod assist with transfer and gait (two short distance bouts this afternoon.) Seems to be moving a bit better than earlier per OT records. He was able to walk around bed, and was eager to sit upright in the recliner. Mitts reapplied as he was pulling at leads earlier. RN notified. Patient will benefit from continued inpatient follow up therapy, <3 hours/day.  Pt currently with functional limitations due to the deficits listed below (see PT Problem List). Pt will benefit from acute skilled PT to increase their independence and safety with mobility to allow discharge.           If plan is discharge home, recommend the following: A lot of help with walking and/or transfers;A lot of help with bathing/dressing/bathroom;Assistance with cooking/housework;Assistance with feeding;Direct supervision/assist for medications management;Direct supervision/assist for financial management;Assist for transportation;Supervision due to cognitive status   Can travel by private vehicle   No (likely soon)    Equipment Recommendations None recommended by PT  Recommendations for Other Services       Functional Status Assessment Patient has had a recent decline in their functional status and demonstrates the ability to make significant improvements in function in a reasonable and predictable amount of time.     Precautions / Restrictions Precautions Precautions: Fall Restrictions Weight Bearing Restrictions: No      Mobility  Bed  Mobility Overal bed mobility: Needs Assistance Bed Mobility: Supine to Sit     Supine to sit: Mod assist, HOB elevated     General bed mobility comments: Mod assist to rise to EOB facilitating LEs to edge, and lightly assistin trunk to rise. Rigid.    Transfers Overall transfer level: Needs assistance Equipment used: Rolling walker (2 wheels) Transfers: Sit to/from Stand Sit to Stand: Mod assist           General transfer comment: Min assist to stand from bed, mod assist from standard chair with arm rests. Cues for forward weight shift and LE placement prior to rising. Hand over hand guidance for hand placement to push up.    Ambulation/Gait Ambulation/Gait assistance: Mod assist Gait Distance (Feet): 6 Feet (+8) Assistive device: Rolling walker (2 wheels) Gait Pattern/deviations: Step-through pattern, Decreased stride length, Decreased dorsiflexion - right, Decreased dorsiflexion - left, Knee flexed in stance - right, Knee flexed in stance - left, Shuffle, Festinating, Narrow base of support, Trunk flexed Gait velocity: dec Gait velocity interpretation: <1.31 ft/sec, indicative of household ambulator   General Gait Details: Common parkinsonian type gait features, festinating, shuffled, with no overt buckling, required up to mod assist for RW control and balance. Max VC for sequencing and tempo. Freezing often. Cues for upright posture and alignment with chair prior to sitting back down.  Stairs            Wheelchair Mobility     Tilt Bed    Modified Rankin (Stroke Patients Only)       Balance Overall balance assessment: Needs assistance Sitting-balance support: Feet supported Sitting balance-Leahy Scale: Fair     Standing balance support: Bilateral upper  extremity supported, Reliant on assistive device for balance Standing balance-Leahy Scale: Poor                               Pertinent Vitals/Pain Pain Assessment Pain Assessment:  No/denies pain    Home Living Family/patient expects to be discharged to:: Assisted living                 Home Equipment: Rollator (4 wheels);Wheelchair - manual Additional Comments: [facility]    Prior Function Prior Level of Function : Needs assist             Mobility Comments: amb short distance with RW, WC for longer distances ADLs Comments: needs assist for all aspects of ADLs due to weakness and cognition     Extremity/Trunk Assessment   Upper Extremity Assessment Upper Extremity Assessment: Defer to OT evaluation    Lower Extremity Assessment Lower Extremity Assessment: Generalized weakness    Cervical / Trunk Assessment Cervical / Trunk Assessment: Kyphotic  Communication   Communication Communication: Hearing impairment  Cognition Arousal: Alert Behavior During Therapy: Flat affect Overall Cognitive Status: No family/caregiver present to determine baseline cognitive functioning                                 General Comments: dementia at baseline, able to state his name and birthday, location in hospital.        General Comments General comments (skin integrity, edema, etc.): VSS on RA, caregiver present    Exercises     Assessment/Plan    PT Assessment Patient needs continued PT services  PT Problem List Decreased strength;Decreased range of motion;Decreased activity tolerance;Decreased balance;Decreased mobility;Decreased coordination;Decreased cognition;Decreased knowledge of use of DME;Decreased safety awareness;Decreased knowledge of precautions;Impaired tone       PT Treatment Interventions DME instruction;Gait training;Functional mobility training;Therapeutic activities;Therapeutic exercise;Balance training;Neuromuscular re-education;Cognitive remediation;Patient/family education;Wheelchair mobility training    PT Goals (Current goals can be found in the Care Plan section)  Acute Rehab PT Goals Patient Stated  Goal: none stated PT Goal Formulation: With patient Time For Goal Achievement: 04/14/23 Potential to Achieve Goals: Good    Frequency Min 1X/week     Co-evaluation               AM-PAC PT "6 Clicks" Mobility  Outcome Measure Help needed turning from your back to your side while in a flat bed without using bedrails?: A Little Help needed moving from lying on your back to sitting on the side of a flat bed without using bedrails?: A Lot Help needed moving to and from a bed to a chair (including a wheelchair)?: A Lot Help needed standing up from a chair using your arms (e.g., wheelchair or bedside chair)?: A Lot Help needed to walk in hospital room?: A Lot Help needed climbing 3-5 steps with a railing? : Total 6 Click Score: 12    End of Session Equipment Utilized During Treatment: Gait belt Activity Tolerance: Patient tolerated treatment well Patient left: in chair;with call bell/phone within reach;with chair alarm set;with restraints reapplied Nurse Communication: Mobility status PT Visit Diagnosis: Unsteadiness on feet (R26.81);Other abnormalities of gait and mobility (R26.89);Muscle weakness (generalized) (M62.81);Other symptoms and signs involving the nervous system (R29.898)    Time: 1610-9604 PT Time Calculation (min) (ACUTE ONLY): 24 min   Charges:   PT Evaluation $PT Eval Moderate Complexity: 1 Mod PT  Treatments $Therapeutic Activity: 8-22 mins PT General Charges $$ ACUTE PT VISIT: 1 Visit         Kathlyn Sacramento, PT, DPT [facility]  Rehabilitation Services Physical Therapist Office: [phone] Website: [email or web address]   Berton Mount 03/31/2023, 5:55 PM

## 2023-03-31 NOTE — Progress Notes (Addendum)
Transition of Care [facility]) - Inpatient Brief Assessment   Patient Details  Name: Lee Martinez MRN: [id number] Date of Birth: 1935-10-30  [facility] CM/SW Contact:    Janae Bridgeman, RN Phone Number: 03/31/2023, 11:08 AM   Clinical Narrative: CM attempted to meet with the patient at the bedside to discuss TOC needs.  The patient admitted to the hospital for Septicemia and patient remains confused and is unable to provide history at this time.  I called and left a voicemail message with the patient's POA, Glory Buff but was unable to reach him.  Patient is currently on oxygen in the hospital. Bedside RN was notified that patient had dislodged his IV so that she could replace.  Patient is pending PT/OT evaluation at this time.  CM and MSW with [facility] Team will continue to follow the patient for likely need for SNF placement.  03/31/2023 1512 - CM noted that patient has dual HCPOA noted in the patient's hard chart - noting Casimiro Needle Minor - [phone] and Milderd Meager.  I spoke with Minor and he was in agreement for SNF work up.  [place], MSW to follow up when bed offers are available.  Patient remains confused and unable to make decisions regarding SNF work up and bed offers.  SNF workup will be started.   Transition of Care Asessment: Insurance and Status: (P) Insurance coverage has been reviewed Patient has primary care physician: (P) Yes (PCP availability through [facility]) Home environment has been reviewed: (P) From [facility]   Prior/Current Home Services: (P) Current home services   Readmission risk has been reviewed: (P) Yes Transition of care needs: (P) transition of care needs identified, TOC will continue to follow

## 2023-03-31 NOTE — Progress Notes (Signed)
[facility] PROGRESS NOTE    Progress Note  Lee Martinez  ZOX:[id number] DOB: November 16, 1935 DOA: 03/30/2023 PCP: Pcp, No     Brief Narrative:   Lee Martinez is an 87 y.o. male past medical history of Parkinson disease/advanced dementia BPH depression comes in with altered mental status as per friend the patient was in a facility he went and saw him he was complaining of shortness of breath in the bathroom and coughing for the past few days.  He is also been having trouble eating solid foods and his urine has been more concentrated.  Assessment/Plan:   Severe sepsis 2/2 to PNA: Chest x-ray showed left basilar airspace disease, with increased shortness of breath with leukopenia Start empirically on IV Vanc. and cefepime, transition to oral Rocephin and azithromycin. Fluid resuscitated and started on antibiotics Urine cultures are pending. Blood cultures x 2 have been ordered on 03/30/2023. CT of the head showed no acute findings.  Acute metabolic encephalopathy: Likely due to infectious etiology currently on antibiotics.  Acute kidney injury: Likely prerenal azotemia, with baseline creatinine around 1.0-1.3, on admission 4.6.  Specific gravity in on UA is 1013 Was started on aggressive IV fluid, Foley catheter placed. Negative about 2.5 L to continue IV fluid resuscitation. Monitor strict I's and O's and daily weights.  Parkinson's disease/dementia: Resume home meds.  Pancreatic lesion: Noted on CT on March 2024. Follow-up with [facility] as an outpatient as a CT scan of the abdomen as an outpatient as cannot tolerate MRCP due to Parkinson's.  Acute urinary retention: Foley catheter placed in the ED due to possible retention, there is also a question of UTI. Restart Flomax low threshold to DC Foley catheter.  Increased anion gap metabolic acidosis: Secondary to acute kidney injury and possibly hypotension. Will start on IV fluids lactic acidosis has resolved.    DVT  prophylaxis: lovenox Family Communication:none Status is: Inpatient Remains inpatient appropriate because: Severe sepsis due to pneumonia    Code Status:     Code Status Orders  (From admission, onward)           Start     Ordered   03/30/23 2121  Do not attempt resuscitation (DNR)- Limited -Do Not Intubate (DNI)  Continuous       Question Answer Comment  If pulseless and not breathing No CPR or chest compressions.   In Pre-Arrest Conditions (Patient Is Breathing and Has A Pulse) Do not intubate. Provide all appropriate non-invasive medical interventions. Avoid ICU transfer unless indicated or required.   Consent: Discussion documented in EHR or advanced directives reviewed      03/30/23 2120           Code Status History     Date Active Date Inactive Code Status Order ID Comments User Context   03/30/2023 1717 03/30/2023 2120 Limited: Do not attempt resuscitation (DNR) -DNR-LIMITED -Do Not Intubate/DNI  [id number]  Vanetta Mulders, MD ED   05/20/2022 2030 05/28/2022 1901 DNR [id number]  Hillary Bow, DO ED         IV Access:   Peripheral IV   Procedures and diagnostic studies:   CT Head Wo Contrast  Result Date: 03/30/2023 CLINICAL DATA:  Altered mental status. EXAM: CT HEAD WITHOUT CONTRAST TECHNIQUE: Contiguous axial images were obtained from the base of the skull through the vertex without intravenous contrast. RADIATION DOSE REDUCTION: This exam was performed according to the departmental dose-optimization program which includes automated exposure control, adjustment of the mA and/or kV  according to patient size and/or use of iterative reconstruction technique. COMPARISON:  Head CT dated 08/18/2022. FINDINGS: Brain: Mild age-related atrophy and chronic microvascular ischemic changes. There is no acute intracranial hemorrhage. No mass effect or midline shift. No extra-axial fluid collection. Vascular: No hyperdense vessel or unexpected calcification. Skull:  Normal. Negative for fracture or focal lesion. Sinuses/Orbits: No acute finding. Other: None IMPRESSION: 1. No acute intracranial pathology. 2. Mild age-related atrophy and chronic microvascular ischemic changes. Electronically Signed   By: Elgie Collard M.D.   On: 03/30/2023 19:53   DG Chest Port 1 View  Result Date: 03/30/2023 CLINICAL DATA:  Sepsis, unresponsive episode EXAM: PORTABLE CHEST 1 VIEW COMPARISON:  07/26/2022 FINDINGS: Single frontal view of the chest demonstrates an unremarkable cardiac silhouette. Increased density at the left lung base may reflect airspace disease or atelectasis. No effusion or pneumothorax. No acute bony abnormalities. IMPRESSION: 1. Left basilar consolidation consistent with atelectasis or airspace disease. Electronically Signed   By: Sharlet Salina M.D.   On: 03/30/2023 17:51     Medical Consultants:   None.   Subjective:    Lee Martinez patient is still confused relates no pain.  Objective:    Vitals:   03/30/23 2235 03/30/23 2303 03/31/23 0513 03/31/23 0745  BP: (!) 89/62  (!) 135/112 98/71  Pulse: 94  93 73  Resp: 18  17 17   Temp:   97.7 F (36.5 C) 98.3 F (36.8 C)  TempSrc:   Oral Oral  SpO2: 99%  94% 96%  Weight:  64.9 kg    Height:  6' (1.829 m)     SpO2: 96 % O2 Flow Rate (L/min): 2 L/min   Intake/Output Summary (Last 24 hours) at 03/31/2023 0906 Last data filed at 03/31/2023 0600 Gross per 24 hour  Intake --  Output 2400 ml  Net -2400 ml   Filed Weights   03/30/23 2303  Weight: 64.9 kg    Exam: General exam: In no acute distress. Respiratory system: Good air movement and crackles bilaterally Cardiovascular system: S1 & S2 heard, RRR. No JVD. Gastrointestinal system: Abdomen is nondistended, soft and nontender.  Extremities: No pedal edema. Skin: No rashes, lesions or ulcers Psychiatry: Judgement and insight appear normal. Mood & affect appropriate.    Data Reviewed:    Labs: Basic Metabolic  Panel: Recent Labs  Lab 03/30/23 1700  NA 142  K 4.2  CL 110  CO2 16*  GLUCOSE 194*  BUN 122*  CREATININE 4.68*  CALCIUM 8.7*   GFR Estimated Creatinine Clearance: 10.6 mL/min (A) (by C-G formula based on SCr of 4.68 mg/dL (H)). Liver Function Tests: Recent Labs  Lab 03/30/23 1700  AST 44*  ALT 13  ALKPHOS 58  BILITOT 0.4  PROT 6.2*  ALBUMIN 2.5*   No results for input(s): "LIPASE", "AMYLASE" in the last 168 hours. No results for input(s): "AMMONIA" in the last 168 hours. Coagulation profile Recent Labs  Lab 03/30/23 1700  INR 1.3*   COVID-19 Labs  No results for input(s): "DDIMER", "FERRITIN", "LDH", "CRP" in the last 72 hours.  Lab Results  Component Value Date   SARSCOV2NAA NEGATIVE 03/30/2023    CBC: Recent Labs  Lab 03/30/23 1700 03/31/23 0555  WBC 2.8* 2.5*  NEUTROABS 1.8  --   HGB 12.0* 10.7*  HCT 38.1* 32.9*  MCV 94.5 94.0  PLT 140* 107*   Cardiac Enzymes: No results for input(s): "CKTOTAL", "CKMB", "CKMBINDEX", "TROPONINI" in the last 168 hours. BNP (last 3 results) No results  for input(s): "PROBNP" in the last 8760 hours. CBG: Recent Labs  Lab 03/30/23 1818 03/30/23 2241  GLUCAP 158* 136*   D-Dimer: No results for input(s): "DDIMER" in the last 72 hours. Hgb A1c: No results for input(s): "HGBA1C" in the last 72 hours. Lipid Profile: No results for input(s): "CHOL", "HDL", "LDLCALC", "TRIG", "CHOLHDL", "LDLDIRECT" in the last 72 hours. Thyroid function studies: No results for input(s): "TSH", "T4TOTAL", "T3FREE", "THYROIDAB" in the last 72 hours.  Invalid input(s): "FREET3" Anemia work up: No results for input(s): "VITAMINB12", "FOLATE", "FERRITIN", "TIBC", "IRON", "RETICCTPCT" in the last 72 hours. Sepsis Labs: Recent Labs  Lab 03/30/23 1700 03/30/23 1819 03/30/23 1953 03/31/23 0555  WBC 2.8*  --   --  2.5*  LATICACIDVEN  --  3.3* 1.8  --    Microbiology Recent Results (from the past 240 hour(s))  Resp panel by RT-PCR  (RSV, Flu A&B, Covid) Anterior Nasal Swab     Status: None   Collection Time: 03/30/23  5:13 PM   Specimen: Anterior Nasal Swab  Result Value Ref Range Status   SARS Coronavirus 2 by RT PCR NEGATIVE NEGATIVE Final   Influenza A by PCR NEGATIVE NEGATIVE Final   Influenza B by PCR NEGATIVE NEGATIVE Final    Comment: (NOTE) The Xpert Xpress SARS-CoV-2/FLU/RSV plus assay is intended as an aid in the diagnosis of influenza from Nasopharyngeal swab specimens and should not be used as a sole basis for treatment. Nasal washings and aspirates are unacceptable for Xpert Xpress SARS-CoV-2/FLU/RSV testing.  Fact Sheet for Patients: BloggerCourse.com  Fact Sheet for Healthcare Providers: SeriousBroker.it  This test is not yet approved or cleared by the [place] FDA and has been authorized for detection and/or diagnosis of SARS-CoV-2 by FDA under an Emergency Use Authorization (EUA). This EUA will remain in effect (meaning this test can be used) for the duration of the COVID-19 declaration under Section 564(b)(1) of the Act, 21 U.S.C. section 360bbb-3(b)(1), unless the authorization is terminated or revoked.     Resp Syncytial Virus by PCR NEGATIVE NEGATIVE Final    Comment: (NOTE) Fact Sheet for Patients: BloggerCourse.com  Fact Sheet for Healthcare Providers: SeriousBroker.it  This test is not yet approved or cleared by the [place] FDA and has been authorized for detection and/or diagnosis of SARS-CoV-2 by FDA under an Emergency Use Authorization (EUA). This EUA will remain in effect (meaning this test can be used) for the duration of the COVID-19 declaration under Section 564(b)(1) of the Act, 21 U.S.C. section 360bbb-3(b)(1), unless the authorization is terminated or revoked.  Performed at [facility], [address], [place], [place] [address]   Blood Culture  (routine x 2)     Status: None (Preliminary result)   Collection Time: 03/30/23  5:58 PM   Specimen: BLOOD  Result Value Ref Range Status   Specimen Description BLOOD RIGHT ANTECUBITAL  Final   Special Requests   Final    BOTTLES DRAWN AEROBIC AND ANAEROBIC Blood Culture adequate volume   Culture   Final    NO GROWTH < 12 HOURS Performed at [facility], [address], [place], [place] [address]    Report Status PENDING  Incomplete  Blood Culture (routine x 2)     Status: None (Preliminary result)   Collection Time: 03/30/23  6:11 PM   Specimen: BLOOD  Result Value Ref Range Status   Specimen Description BLOOD BLOOD RIGHT HAND  Final   Special Requests AEROBIC BOTTLE ONLY Blood Culture  adequate volume  Final   Culture   Final    NO GROWTH < 12 HOURS Performed at [facility], [address], [place], [place] [address]    Report Status PENDING  Incomplete     Medications:    Chlorhexidine Gluconate Cloth  6 each Topical Daily   enoxaparin (LOVENOX) injection  30 mg Subcutaneous Q24H   Continuous Infusions:  azithromycin Stopped (03/31/23 0032)   cefTRIAXone (ROCEPHIN)  IV     lactated ringers 150 mL/hr at 03/31/23 0711      LOS: 1 day   Marinda Elk  [facility]  03/31/2023, 9:06 AM

## 2023-03-31 NOTE — Progress Notes (Signed)
Name: Lee Martinez DOB: 1936-02-04  Please be advised that the above-named patient will require a short-term nursing home stay -- anticipated 30 days or less for rehabilitation and strengthening. The plan is for return home.

## 2023-04-01 DIAGNOSIS — N170 Acute kidney failure with tubular necrosis: Secondary | ICD-10-CM | POA: Diagnosis not present

## 2023-04-01 DIAGNOSIS — A419 Sepsis, unspecified organism: Secondary | ICD-10-CM | POA: Diagnosis not present

## 2023-04-01 DIAGNOSIS — G20A1 Parkinson's disease without dyskinesia, without mention of fluctuations: Secondary | ICD-10-CM | POA: Diagnosis not present

## 2023-04-01 DIAGNOSIS — R339 Retention of urine, unspecified: Secondary | ICD-10-CM | POA: Diagnosis not present

## 2023-04-01 LAB — BASIC METABOLIC PANEL
Anion gap: 9 (ref 5–15)
BUN: 32 mg/dL — ABNORMAL HIGH (ref 8–23)
CO2: 21 mmol/L — ABNORMAL LOW (ref 22–32)
Calcium: 8.4 mg/dL — ABNORMAL LOW (ref 8.9–10.3)
Chloride: 118 mmol/L — ABNORMAL HIGH (ref 98–111)
Creatinine, Ser: 0.92 mg/dL (ref 0.61–1.24)
GFR, Estimated: >60 mL/min (ref >60)
Glucose, Bld: 103 mg/dL — ABNORMAL HIGH (ref 70–99)
Potassium: 3.6 mmol/L (ref 3.5–5.1)
Sodium: 148 mmol/L — ABNORMAL HIGH (ref 135–145)

## 2023-04-01 MED ORDER — DEXTROSE 5 % IV SOLN: INTRAVENOUS | Status: AC

## 2023-04-01 NOTE — Evaluation (Signed)
Clinical/Bedside Swallow Evaluation Patient Details  Name: Lee Martinez MRN: [id number] Date of Birth: 1935/11/23  Today's Date: 04/01/2023 Time: SLP Start Time (ACUTE ONLY): 1255 SLP Stop Time (ACUTE ONLY): 1317 SLP Time Calculation (min) (ACUTE ONLY): 22 min  Past Medical History:  Past Medical History:  Diagnosis Date   Parkinson's disease (HCC) 07/2011   followed by [facility]   Past Surgical History:  Past Surgical History:  Procedure Laterality Date   basil removal     HERNIA REPAIR     THYROID SURGERY     TOTAL HIP ARTHROPLASTY Right 05/21/2022   Procedure: TOTAL HIP ARTHROPLASTY ANTERIOR APPROACH;  Surgeon: Durene Romans, MD;  Location: [facility];  Service: Orthopedics;  Laterality: Right;   HPI:  Lee Martinez is an 87 y.o. male  who presented with AMS. Found to have sepsis due to PNA, encephalopathy and AKI. PMHx: Parkinson disease/advanced dementia BPH depression    Assessment / Plan / Recommendation  Clinical Impression  Pt assessed for swallow and exhibiting signs of pharyngeal dysphagia. Vocal intensity is weak although he did demonstrate a strong volitional cough. Oral cavity with xerostomia and moistened/cleaned by SLP and his dentition is intact. Immediate and consistent cough with thin water that was eliminated with nectar thick juice. Suspect possible delayed swallow initiation with liquids. He was able to masticate solid texture timely without residue however given deconditioned status, recommend initiate Dys 3 (chopped meats) and nectar thick liquids. He would benefit from an MBS to fully assess swallow function. SLP Visit Diagnosis: Dysphagia, unspecified (R13.10)    Aspiration Risk  Mild aspiration risk    Diet Recommendation Dysphagia 3 (Mech soft);Nectar-thick liquid    Liquid Administration via: Cup;Straw Medication Administration: Crushed with puree Supervision: Staff to assist with self feeding Compensations: Slow rate;Small sips/bites Postural  Changes: Seated upright at 90 degrees    Other  Recommendations Oral Care Recommendations: Oral care BID    Recommendations for follow up therapy are one component of a multi-disciplinary discharge planning process, led by the attending physician.  Recommendations may be updated based on patient status, additional functional criteria and insurance authorization.  Follow up Recommendations  (TBD)      Assistance Recommended at Discharge    Functional Status Assessment Patient has had a recent decline in their functional status and demonstrates the ability to make significant improvements in function in a reasonable and predictable amount of time.  Frequency and Duration min 2x/week  2 weeks       Prognosis Prognosis for improved oropharyngeal function: Fair      Swallow Study   General Date of Onset: 04/01/23 HPI: Lee Martinez is an 87 y.o. male  who presented with AMS. Found to have sepsis due to PNA, encephalopathy and AKI. PMHx: Parkinson disease/advanced dementia BPH depression Type of Study: Bedside Swallow Evaluation Previous Swallow Assessment:  (none) Diet Prior to this Study: NPO Temperature Spikes Noted: No Respiratory Status: Room air History of Recent Intubation: No Behavior/Cognition: Cooperative;Pleasant mood;Requires cueing;Lethargic/Drowsy Oral Cavity Assessment: Dry Oral Care Completed by SLP: Yes Oral Cavity - Dentition: Adequate natural dentition Vision: Functional for self-feeding Self-Feeding Abilities: Needs assist Patient Positioning: Upright in bed Baseline Vocal Quality: Low vocal intensity Volitional Cough: Strong Volitional Swallow: Able to elicit    Oral/Motor/Sensory Function Overall Oral Motor/Sensory Function: Within functional limits   Ice Chips Ice chips: Within functional limits Presentation: Spoon   Thin Liquid Thin Liquid: Impaired Presentation: Cup;Spoon Pharyngeal  Phase Impairments: Cough - Immediate;Suspected delayed Swallow  Nectar Thick Nectar Thick Liquid: Impaired Presentation: Cup;Straw Pharyngeal Phase Impairments: Suspected delayed Swallow   Honey Thick Honey Thick Liquid: Not tested   Puree Puree: Within functional limits   Solid     Solid: Within functional limits      Royce Macadamia 04/01/2023,1:29 PM

## 2023-04-01 NOTE — Progress Notes (Signed)
[facility] PROGRESS NOTE    Progress Note  Lee Martinez  JOA:[id number] DOB: October 27, 1935 DOA: 03/30/2023 PCP: Pcp, No     Brief Narrative:   Lee Martinez is an 87 y.o. male past medical history of Parkinson disease/advanced dementia BPH depression comes in with altered mental status as per friend the patient was in a facility he went and saw him he was complaining of shortness of breath in the bathroom and coughing for the past few days.  He is also been having trouble eating solid foods and his urine has been more concentrated.  Assessment/Plan:   Severe sepsis 2/2 to PNA: Chest x-ray showed left basilar airspace disease, with increased shortness of breath with leukopenia On Rocephin and azithromycin. Urine cultures are pending. Blood cultures x 2 have been ordered on 03/30/2023. CT of the head showed no acute findings. PT evaluated the patient and recommended rehab  Acute metabolic encephalopathy: Likely due to infectious etiology currently on antibiotics.  Acute kidney injury: Likely prerenal azotemia, with baseline creatinine around 1.0-1.3, on admission 4.6.  Specific gravity in on UA is 1013 Started on IV fluids creatinine improved to 1.7. KVO IV fluids. Give a voiding trial.  Parkinson's disease/dementia: Resume home meds.  Pancreatic lesion: Noted on CT on March 2024. Follow-up with [facility] as an outpatient as a CT scan of the abdomen as an outpatient as cannot tolerate MRCP due to Parkinson's.  Acute urinary retention: Foley catheter placed in the ED due to possible retention, there is also a question of UTI. Restart Flomax low threshold to DC Foley catheter.  Increased anion gap metabolic acidosis: Secondary to acute kidney injury and possibly hypotension. Now resolved.    DVT prophylaxis: lovenox Family Communication:none Status is: Inpatient Remains inpatient appropriate because: Severe sepsis due to pneumonia    Code Status:     Code  Status Orders  (From admission, onward)           Start     Ordered   03/30/23 2121  Do not attempt resuscitation (DNR)- Limited -Do Not Intubate (DNI)  Continuous       Question Answer Comment  If pulseless and not breathing No CPR or chest compressions.   In Pre-Arrest Conditions (Patient Is Breathing and Has A Pulse) Do not intubate. Provide all appropriate non-invasive medical interventions. Avoid ICU transfer unless indicated or required.   Consent: Discussion documented in EHR or advanced directives reviewed      03/30/23 2120           Code Status History     Date Active Date Inactive Code Status Order ID Comments User Context   03/30/2023 1717 03/30/2023 2120 Limited: Do not attempt resuscitation (DNR) -DNR-LIMITED -Do Not Intubate/DNI  [id number]  Vanetta Mulders, MD ED   05/20/2022 2030 05/28/2022 1901 DNR [id number]  Hillary Bow, DO ED         IV Access:   Peripheral IV   Procedures and diagnostic studies:   CT Head Wo Contrast  Result Date: 03/30/2023 CLINICAL DATA:  Altered mental status. EXAM: CT HEAD WITHOUT CONTRAST TECHNIQUE: Contiguous axial images were obtained from the base of the skull through the vertex without intravenous contrast. RADIATION DOSE REDUCTION: This exam was performed according to the departmental dose-optimization program which includes automated exposure control, adjustment of the mA and/or kV according to patient size and/or use of iterative reconstruction technique. COMPARISON:  Head CT dated 08/18/2022. FINDINGS: Brain: Mild age-related atrophy and chronic microvascular ischemic changes.  There is no acute intracranial hemorrhage. No mass effect or midline shift. No extra-axial fluid collection. Vascular: No hyperdense vessel or unexpected calcification. Skull: Normal. Negative for fracture or focal lesion. Sinuses/Orbits: No acute finding. Other: None IMPRESSION: 1. No acute intracranial pathology. 2. Mild age-related atrophy and  chronic microvascular ischemic changes. Electronically Signed   By: Elgie Collard M.D.   On: 03/30/2023 19:53   DG Chest Port 1 View  Result Date: 03/30/2023 CLINICAL DATA:  Sepsis, unresponsive episode EXAM: PORTABLE CHEST 1 VIEW COMPARISON:  07/26/2022 FINDINGS: Single frontal view of the chest demonstrates an unremarkable cardiac silhouette. Increased density at the left lung base may reflect airspace disease or atelectasis. No effusion or pneumothorax. No acute bony abnormalities. IMPRESSION: 1. Left basilar consolidation consistent with atelectasis or airspace disease. Electronically Signed   By: Sharlet Salina M.D.   On: 03/30/2023 17:51     Medical Consultants:   None.   Subjective:    Lee Martinez no complains.  Objective:    Vitals:   03/31/23 2008 04/01/23 0007 04/01/23 0330 04/01/23 0759  BP: 117/63 (!) 88/64 112/77 131/80  Pulse: 90 73 84 78  Resp:  18 17 17   Temp: 97.7 F (36.5 C) 98.7 F (37.1 C) 98.7 F (37.1 C) 97.6 F (36.4 C)  TempSrc: Oral   Oral  SpO2: 96% 98% 99% 93%  Weight:      Height:       SpO2: 93 % O2 Flow Rate (L/min): 2 L/min   Intake/Output Summary (Last 24 hours) at 04/01/2023 1035 Last data filed at 04/01/2023 1018 Gross per 24 hour  Intake 1689.51 ml  Output 1150 ml  Net 539.51 ml   Filed Weights   03/30/23 2303  Weight: 64.9 kg    Exam: General exam: In no acute distress. Respiratory system: Good air movement and clear to auscultation. Cardiovascular system: S1 & S2 heard, RRR. No JVD. Gastrointestinal system: Abdomen is nondistended, soft and nontender.  Extremities: No pedal edema. Skin: No rashes, lesions or ulcers Psychiatry: Judgement and insight appear normal. Mood & affect appropriate. Data Reviewed:    Labs: Basic Metabolic Panel: Recent Labs  Lab 03/30/23 1700 03/31/23 1001  NA 142 144  K 4.2 3.8  CL 110 116*  CO2 16* 21*  GLUCOSE 194* 114*  BUN 122* 66*  CREATININE 4.68* 1.76*  CALCIUM 8.7*  8.3*   GFR Estimated Creatinine Clearance: 28.2 mL/min (A) (by C-G formula based on SCr of 1.76 mg/dL (H)). Liver Function Tests: Recent Labs  Lab 03/30/23 1700  AST 44*  ALT 13  ALKPHOS 58  BILITOT 0.4  PROT 6.2*  ALBUMIN 2.5*   No results for input(s): "LIPASE", "AMYLASE" in the last 168 hours. No results for input(s): "AMMONIA" in the last 168 hours. Coagulation profile Recent Labs  Lab 03/30/23 1700  INR 1.3*   COVID-19 Labs  No results for input(s): "DDIMER", "FERRITIN", "LDH", "CRP" in the last 72 hours.  Lab Results  Component Value Date   SARSCOV2NAA NEGATIVE 03/30/2023    CBC: Recent Labs  Lab 03/30/23 1700 03/31/23 0555  WBC 2.8* 2.5*  NEUTROABS 1.8  --   HGB 12.0* 10.7*  HCT 38.1* 32.9*  MCV 94.5 94.0  PLT 140* 107*   Cardiac Enzymes: No results for input(s): "CKTOTAL", "CKMB", "CKMBINDEX", "TROPONINI" in the last 168 hours. BNP (last 3 results) No results for input(s): "PROBNP" in the last 8760 hours. CBG: Recent Labs  Lab 03/30/23 1818 03/30/23 2241 03/31/23 2204  GLUCAP 158* 136* 131*   D-Dimer: No results for input(s): "DDIMER" in the last 72 hours. Hgb A1c: No results for input(s): "HGBA1C" in the last 72 hours. Lipid Profile: No results for input(s): "CHOL", "HDL", "LDLCALC", "TRIG", "CHOLHDL", "LDLDIRECT" in the last 72 hours. Thyroid function studies: No results for input(s): "TSH", "T4TOTAL", "T3FREE", "THYROIDAB" in the last 72 hours.  Invalid input(s): "FREET3" Anemia work up: No results for input(s): "VITAMINB12", "FOLATE", "FERRITIN", "TIBC", "IRON", "RETICCTPCT" in the last 72 hours. Sepsis Labs: Recent Labs  Lab 03/30/23 1700 03/30/23 1819 03/30/23 1953 03/31/23 0555  WBC 2.8*  --   --  2.5*  LATICACIDVEN  --  3.3* 1.8  --    Microbiology Recent Results (from the past 240 hour(s))  Urine Culture     Status: None   Collection Time: 03/30/23  4:55 PM   Specimen: Urine, Random  Result Value Ref Range Status    Specimen Description URINE, RANDOM  Final   Special Requests NONE Reflexed from [id number]  Final   Culture   Final    NO GROWTH Performed at [facility], [address], [place], [place] [address]    Report Status 03/31/2023 FINAL  Final  Resp panel by RT-PCR (RSV, Flu A&B, Covid) Anterior Nasal Swab     Status: None   Collection Time: 03/30/23  5:13 PM   Specimen: Anterior Nasal Swab  Result Value Ref Range Status   SARS Coronavirus 2 by RT PCR NEGATIVE NEGATIVE Final   Influenza A by PCR NEGATIVE NEGATIVE Final   Influenza B by PCR NEGATIVE NEGATIVE Final    Comment: (NOTE) The Xpert Xpress SARS-CoV-2/FLU/RSV plus assay is intended as an aid in the diagnosis of influenza from Nasopharyngeal swab specimens and should not be used as a sole basis for treatment. Nasal washings and aspirates are unacceptable for Xpert Xpress SARS-CoV-2/FLU/RSV testing.  Fact Sheet for Patients: BloggerCourse.com  Fact Sheet for Healthcare Providers: SeriousBroker.it  This test is not yet approved or cleared by the [place] FDA and has been authorized for detection and/or diagnosis of SARS-CoV-2 by FDA under an Emergency Use Authorization (EUA). This EUA will remain in effect (meaning this test can be used) for the duration of the COVID-19 declaration under Section 564(b)(1) of the Act, 21 U.S.C. section 360bbb-3(b)(1), unless the authorization is terminated or revoked.     Resp Syncytial Virus by PCR NEGATIVE NEGATIVE Final    Comment: (NOTE) Fact Sheet for Patients: BloggerCourse.com  Fact Sheet for Healthcare Providers: SeriousBroker.it  This test is not yet approved or cleared by the [place] FDA and has been authorized for detection and/or diagnosis of SARS-CoV-2 by FDA under an Emergency Use Authorization (EUA). This EUA will remain in effect (meaning this test can be used)  for the duration of the COVID-19 declaration under Section 564(b)(1) of the Act, 21 U.S.C. section 360bbb-3(b)(1), unless the authorization is terminated or revoked.  Performed at [facility], [address] [place], [place] [address]   Blood Culture (routine x 2)     Status: None (Preliminary result)   Collection Time: 03/30/23  5:58 PM   Specimen: BLOOD  Result Value Ref Range Status   Specimen Description BLOOD RIGHT ANTECUBITAL  Final   Special Requests   Final    BOTTLES DRAWN AEROBIC AND ANAEROBIC Blood Culture adequate volume   Culture   Final    NO GROWTH 2 DAYS Performed at [facility], [address] [place],  [place] [address]    Report Status PENDING  Incomplete  Blood Culture (routine x 2)     Status: None (Preliminary result)   Collection Time: 03/30/23  6:11 PM   Specimen: BLOOD  Result Value Ref Range Status   Specimen Description BLOOD BLOOD RIGHT HAND  Final   Special Requests AEROBIC BOTTLE ONLY Blood Culture adequate volume  Final   Culture   Final    NO GROWTH 2 DAYS Performed at [facility], [address] [place], [place] [address]    Report Status PENDING  Incomplete     Medications:    ARIPiprazole  2 mg Oral Daily   carbidopa-levodopa  3 tablet Oral TID   Chlorhexidine Gluconate Cloth  6 each Topical Daily   enoxaparin (LOVENOX) injection  30 mg Subcutaneous Q24H   polyethylene glycol  17 g Oral Daily   tamsulosin  0.4 mg Oral Daily   Continuous Infusions:  azithromycin 500 mg (03/31/23 2140)   cefTRIAXone (ROCEPHIN)  IV Stopped (03/31/23 1709)      LOS: 2 days   Marinda Elk  [facility]  04/01/2023, 10:35 AM

## 2023-04-02 ENCOUNTER — Inpatient Hospital Stay (HOSPITAL_COMMUNITY): Payer: Medicare Other

## 2023-04-02 DIAGNOSIS — G20A1 Parkinson's disease without dyskinesia, without mention of fluctuations: Secondary | ICD-10-CM | POA: Diagnosis not present

## 2023-04-02 DIAGNOSIS — A419 Sepsis, unspecified organism: Secondary | ICD-10-CM | POA: Diagnosis not present

## 2023-04-02 DIAGNOSIS — N170 Acute kidney failure with tubular necrosis: Secondary | ICD-10-CM | POA: Diagnosis not present

## 2023-04-02 DIAGNOSIS — Z515 Encounter for palliative care: Secondary | ICD-10-CM

## 2023-04-02 DIAGNOSIS — N179 Acute kidney failure, unspecified: Secondary | ICD-10-CM | POA: Diagnosis not present

## 2023-04-02 DIAGNOSIS — G9341 Metabolic encephalopathy: Secondary | ICD-10-CM | POA: Diagnosis not present

## 2023-04-02 DIAGNOSIS — J690 Pneumonitis due to inhalation of food and vomit: Secondary | ICD-10-CM | POA: Diagnosis not present

## 2023-04-02 DIAGNOSIS — R339 Retention of urine, unspecified: Secondary | ICD-10-CM | POA: Diagnosis not present

## 2023-04-02 LAB — BASIC METABOLIC PANEL
Anion gap: 9 (ref 5–15)
BUN: 15 mg/dL (ref 8–23)
CO2: 21 mmol/L — ABNORMAL LOW (ref 22–32)
Calcium: 8.2 mg/dL — ABNORMAL LOW (ref 8.9–10.3)
Chloride: 112 mmol/L — ABNORMAL HIGH (ref 98–111)
Creatinine, Ser: 0.85 mg/dL (ref 0.61–1.24)
GFR, Estimated: >60 mL/min (ref >60)
Glucose, Bld: 112 mg/dL — ABNORMAL HIGH (ref 70–99)
Potassium: 4.3 mmol/L (ref 3.5–5.1)
Sodium: 142 mmol/L (ref 135–145)

## 2023-04-02 MED ORDER — AZITHROMYCIN 500 MG PO TABS
500.0000 mg | ORAL_TABLET | Freq: Every day | ORAL | Status: DC
  Filled 2023-04-02: qty 1

## 2023-04-02 MED ORDER — SODIUM CHLORIDE 0.9 % IV SOLN: 500.0000 mg | INTRAVENOUS | Status: DC

## 2023-04-02 MED ORDER — SODIUM CHLORIDE 0.9 % IV SOLN: 2.0000 g | INTRAVENOUS | Status: DC

## 2023-04-02 MED ORDER — CEFDINIR 300 MG PO CAPS
300.0000 mg | ORAL_CAPSULE | Freq: Two times a day (BID) | ORAL | Status: DC
  Administered 2023-04-02: 300 mg via ORAL
  Filled 2023-04-02: qty 1

## 2023-04-02 MED ORDER — SODIUM CHLORIDE 0.9 % IV SOLN: 1.0000 g | INTRAVENOUS | Status: DC

## 2023-04-02 MED ORDER — SODIUM CHLORIDE 0.9 % IV SOLN
1.5000 g | Freq: Four times a day (QID) | INTRAVENOUS | Status: DC
  Administered 2023-04-02 – 2023-04-04 (×7): 1.5 g via INTRAVENOUS
  Filled 2023-04-02 (×9): qty 4

## 2023-04-02 NOTE — TOC Progression Note (Signed)
Transition of Care [facility]) - Progression Note    Patient Details  Name: Lee Martinez MRN: [id number] Date of Birth: 09-Nov-1935  Transition of Care [facility]) CM/SW Contact  Jarita Raval A [place], [place] Phone Number: 04/02/2023, 11:23 AM  Clinical Narrative:     CSW spoke with pt's POA Michael Minor. He said that he and his co-POA Jayme Cloud were discussing options for pt to return back to [facility] with care services and PT/OT. He said they were still coordinating care with [facility] and would update the [facility] team with any needs once things have been finalized. He did state that pt would need ambulance transport back to facility.   TOC will continue to follow.        Expected Discharge Plan and Services                                               Social Determinants of Health (SDOH) Interventions SDOH Screenings   Food Insecurity: No Food Insecurity (03/30/2023)  Housing: Low Risk  (03/30/2023)  Transportation Needs: No Transportation Needs (03/30/2023)  Utilities: Not At Risk (03/30/2023)  Depression (PHQ2-9): Medium Risk (02/07/2022)  Financial Resource Strain: Low Risk  (02/07/2022)  Physical Activity: Inactive (02/07/2022)  Social Connections: Unknown (09/23/2021)   Received from [facility]  Stress: No Stress Concern Present (02/07/2022)  Tobacco Use: Low Risk  (03/30/2023)    Readmission Risk Interventions    03/31/2023   11:06 AM  Readmission Risk Prevention Plan  PCP or Specialist Appt within 5-7 Days Complete  Home Care Screening Complete  Medication Review (RN CM) Complete

## 2023-04-02 NOTE — Consult Note (Signed)
Consultation Note Date: 04/02/2023   Patient Name: Lee Martinez  DOB: Feb 23, 1936  MRN: [id number]  Age / Sex: 87 y.o., male  PCP: Pcp, No Referring Physician: David Stall, Darin Engels, MD  Reason for Consultation:  "aspirating everything end of life"  HPI/Patient Profile: 87 y.o. male  with past medical history of Parkinson's disease, dementia, BPH, HLD, depression, pancreatic lesion found in March without further workup- outpatient MRI was recommended at that time-  admitted on 03/30/2023 with sepsis likely due to aspiration pneumonia. He has been evaluated by SLP with [facility] and although no aspiration was detected during eval there was significant residual that is suspected to be aspirated when patient is at rest. He has been made NPO by speech. Palliative medicine consulted for GOC.    Primary Decision Maker HCPOA - 1st HCPOA = Kizzie Ide; AlternateCasimiro Needle Minor- HCPOA document is on chart  Discussion: Chart reviewed including labs, progress notes, imaging from this and previous encounters.  Evaluated patient. He was awake and alert, not oriented. He spoke of five girls that caught him.  He has HCPOA and living will on his chart.  He is Jehovah's Witness and would not want any blood products.  I was able to reach his [employer].  I reviewed Palliative team and reason for consult.  Gala Romney would like to meet with me and with patient's other HCPOA and friends.     SUMMARY OF RECOMMENDATIONS -Plan for full GOC discussion with patient's HCPOA- Kizzie Ide and alternate HCPOA- Micheal Minor tomorrow at 1130     Code Status/Advance Care Planning: DNR   Prognosis:   Unable to determine  Discharge Planning: To Be Determined  Primary Diagnoses: Present on Admission:  Parkinson disease (HCC)  BPH with urinary obstruction  Acute metabolic encephalopathy   Review of Systems  Physical  Exam  Vital Signs: BP 114/83 (BP Location: Right Arm)   Pulse 74   Temp (!) 97.2 F (36.2 C)   Resp 17   Ht 6' (1.829 m)   Wt 64.9 kg   SpO2 95%   BMI 19.40 kg/m  Pain Scale: 0-10   Pain Score: 0-No pain   SpO2: SpO2: 95 % O2 Device:SpO2: 95 % O2 Flow Rate: .O2 Flow Rate (L/min): 2 L/min  IO: Intake/output summary:  Intake/Output Summary (Last 24 hours) at 04/02/2023 1624 Last data filed at 04/02/2023 0933 Gross per 24 hour  Intake 1000.12 ml  Output 950 ml  Net 50.12 ml    LBM:   Baseline Weight: Weight: 64.9 kg Most recent weight: Weight: 64.9 kg       Thank you for this consult. Palliative medicine will continue to follow and assist as needed.  Time Total: 60 minutes Signed by: Ocie Bob, AGNP-C Palliative Medicine  Time includes:   Preparing to see the patient (e.g., review of tests) Obtaining and/or reviewing separately obtained history Performing a medically necessary appropriate examination and/or evaluation Counseling and educating the patient/family/caregiver Ordering medications, tests, or procedures Referring and communicating with other health care  professionals (when not reported separately) Documenting clinical information in the electronic or other health record Independently interpreting results (not reported separately) and communicating results to the patient/family/caregiver Care coordination (not reported separately) Clinical documentation   Please contact Palliative Medicine Team phone at [phone] for questions and concerns.  For individual provider: See Loretha Stapler

## 2023-04-02 NOTE — Progress Notes (Addendum)
[facility] PROGRESS NOTE    Progress Note  Lee Martinez  QIO:[id number] DOB: 01-Jul-1935 DOA: 03/30/2023 PCP: Pcp, No     Brief Narrative:   Lee Martinez is an 87 y.o. male past medical history of Parkinson disease/advanced dementia BPH depression comes in with altered mental status as per friend the patient was in a facility he went and saw him he was complaining of shortness of breath in the bathroom and coughing for the past few days.  He is also been having trouble eating solid foods and his urine has been more concentrated.  Assessment/Plan:   Severe sepsis 2/2 to PNA (possibly aspiration PNA): Chest x-ray showed left basilar airspace disease, with increased shortness of breath with leukopenia Failed Swallowing eval., change to IV unasyn.. Blood cultures x 2  03/30/2023 negative. CT of the head showed no acute findings. PT evaluated the patient and recommended rehab. Discontinue cardiac monitoring.  Acute metabolic encephalopathy: Likely due to infectious etiology currently on antibiotics. Resolved awake want to get out of bed.  Acute kidney injury: Likely prerenal azotemia, with baseline creatinine around 1.0-1.3, on admission 4.6.  Specific gravity in on UA is 1013 Creatinine has returned to baseline. Was given a voiding trial.  Hypernatremia: Started on D5 basic metabolic panels pending this morning.  Parkinson's disease/dementia: Resume home meds.  Pancreatic lesion: Noted on CT on March 2024. Follow-up with [facility] as an outpatient as a CT scan of the abdomen as an outpatient as cannot tolerate MRCP due to Parkinson's.  Acute urinary retention: Foley catheter placed in the ED due to possible retention, there is also a question of UTI. Restart Flomax low threshold to DC Foley catheter.  Increased anion gap metabolic acidosis: Secondary to acute kidney injury and possibly hypotension. Now resolved.    DVT prophylaxis: lovenox Family  Communication:none Status is: Inpatient Remains inpatient appropriate because: Severe sepsis due to pneumonia    Code Status:     Code Status Orders  (From admission, onward)           Start     Ordered   03/30/23 2121  Do not attempt resuscitation (DNR)- Limited -Do Not Intubate (DNI)  Continuous       Question Answer Comment  If pulseless and not breathing No CPR or chest compressions.   In Pre-Arrest Conditions (Patient Is Breathing and Has A Pulse) Do not intubate. Provide all appropriate non-invasive medical interventions. Avoid ICU transfer unless indicated or required.   Consent: Discussion documented in EHR or advanced directives reviewed      03/30/23 2120           Code Status History     Date Active Date Inactive Code Status Order ID Comments User Context   03/30/2023 1717 03/30/2023 2120 Limited: Do not attempt resuscitation (DNR) -DNR-LIMITED -Do Not Intubate/DNI  [id number]  Vanetta Mulders, MD ED   05/20/2022 2030 05/28/2022 1901 DNR [id number]  Hillary Bow, DO ED         IV Access:   Peripheral IV   Procedures and diagnostic studies:   No results found.   Medical Consultants:   None.   Subjective:    Lee Martinez no complaints.  Objective:    Vitals:   04/01/23 2043 04/02/23 0004 04/02/23 0504 04/02/23 0801  BP: 112/79 108/76 127/83 130/86  Pulse: 77 82  77  Resp:      Temp: 97.6 F (36.4 C) 97.6 F (36.4 C) (!) 97.5 F (36.4 C)  97.9 F (36.6 C)  TempSrc: Axillary Oral Oral   SpO2: 97% 98%  92%  Weight:      Height:       SpO2: 92 % O2 Flow Rate (L/min): 2 L/min   Intake/Output Summary (Last 24 hours) at 04/02/2023 1155 Last data filed at 04/02/2023 0933 Gross per 24 hour  Intake 1000.12 ml  Output 1100 ml  Net -99.88 ml   Filed Weights   03/30/23 2303  Weight: 64.9 kg    Exam: General exam: In no acute distress. Respiratory system: Good air movement and clear to auscultation. Cardiovascular system: S1  & S2 heard, RRR. No JVD. Gastrointestinal system: Abdomen is nondistended, soft and nontender.  Extremities: No pedal edema. Skin: No rashes, lesions or ulcers Psychiatry: Judgement and insight appear normal. Mood & affect appropriate. Data Reviewed:    Labs: Basic Metabolic Panel: Recent Labs  Lab 03/30/23 1700 03/31/23 1001 04/01/23 1123  NA 142 144 148*  K 4.2 3.8 3.6  CL 110 116* 118*  CO2 16* 21* 21*  GLUCOSE 194* 114* 103*  BUN 122* 66* 32*  CREATININE 4.68* 1.76* 0.92  CALCIUM 8.7* 8.3* 8.4*   GFR Estimated Creatinine Clearance: 53.9 mL/min (by C-G formula based on SCr of 0.92 mg/dL). Liver Function Tests: Recent Labs  Lab 03/30/23 1700  AST 44*  ALT 13  ALKPHOS 58  BILITOT 0.4  PROT 6.2*  ALBUMIN 2.5*   No results for input(s): "LIPASE", "AMYLASE" in the last 168 hours. No results for input(s): "AMMONIA" in the last 168 hours. Coagulation profile Recent Labs  Lab 03/30/23 1700  INR 1.3*   COVID-19 Labs  No results for input(s): "DDIMER", "FERRITIN", "LDH", "CRP" in the last 72 hours.  Lab Results  Component Value Date   SARSCOV2NAA NEGATIVE 03/30/2023    CBC: Recent Labs  Lab 03/30/23 1700 03/31/23 0555  WBC 2.8* 2.5*  NEUTROABS 1.8  --   HGB 12.0* 10.7*  HCT 38.1* 32.9*  MCV 94.5 94.0  PLT 140* 107*   Cardiac Enzymes: No results for input(s): "CKTOTAL", "CKMB", "CKMBINDEX", "TROPONINI" in the last 168 hours. BNP (last 3 results) No results for input(s): "PROBNP" in the last 8760 hours. CBG: Recent Labs  Lab 03/30/23 1818 03/30/23 2241 03/31/23 2204  GLUCAP 158* 136* 131*   D-Dimer: No results for input(s): "DDIMER" in the last 72 hours. Hgb A1c: No results for input(s): "HGBA1C" in the last 72 hours. Lipid Profile: No results for input(s): "CHOL", "HDL", "LDLCALC", "TRIG", "CHOLHDL", "LDLDIRECT" in the last 72 hours. Thyroid function studies: No results for input(s): "TSH", "T4TOTAL", "T3FREE", "THYROIDAB" in the last 72  hours.  Invalid input(s): "FREET3" Anemia work up: No results for input(s): "VITAMINB12", "FOLATE", "FERRITIN", "TIBC", "IRON", "RETICCTPCT" in the last 72 hours. Sepsis Labs: Recent Labs  Lab 03/30/23 1700 03/30/23 1819 03/30/23 1953 03/31/23 0555  WBC 2.8*  --   --  2.5*  LATICACIDVEN  --  3.3* 1.8  --    Microbiology Recent Results (from the past 240 hour(s))  Urine Culture     Status: None   Collection Time: 03/30/23  4:55 PM   Specimen: Urine, Random  Result Value Ref Range Status   Specimen Description URINE, RANDOM  Final   Special Requests NONE Reflexed from [id number]  Final   Culture   Final    NO GROWTH Performed at [facility], [address] [place], [place] [address]    Report Status 03/31/2023 FINAL  Final  Resp panel  by RT-PCR (RSV, Flu A&B, Covid) Anterior Nasal Swab     Status: None   Collection Time: 03/30/23  5:13 PM   Specimen: Anterior Nasal Swab  Result Value Ref Range Status   SARS Coronavirus 2 by RT PCR NEGATIVE NEGATIVE Final   Influenza A by PCR NEGATIVE NEGATIVE Final   Influenza B by PCR NEGATIVE NEGATIVE Final    Comment: (NOTE) The Xpert Xpress SARS-CoV-2/FLU/RSV plus assay is intended as an aid in the diagnosis of influenza from Nasopharyngeal swab specimens and should not be used as a sole basis for treatment. Nasal washings and aspirates are unacceptable for Xpert Xpress SARS-CoV-2/FLU/RSV testing.  Fact Sheet for Patients: BloggerCourse.com  Fact Sheet for Healthcare Providers: SeriousBroker.it  This test is not yet approved or cleared by the [place] FDA and has been authorized for detection and/or diagnosis of SARS-CoV-2 by FDA under an Emergency Use Authorization (EUA). This EUA will remain in effect (meaning this test can be used) for the duration of the COVID-19 declaration under Section 564(b)(1) of the Act, 21 U.S.C. section 360bbb-3(b)(1), unless the authorization  is terminated or revoked.     Resp Syncytial Virus by PCR NEGATIVE NEGATIVE Final    Comment: (NOTE) Fact Sheet for Patients: BloggerCourse.com  Fact Sheet for Healthcare Providers: SeriousBroker.it  This test is not yet approved or cleared by the [place] FDA and has been authorized for detection and/or diagnosis of SARS-CoV-2 by FDA under an Emergency Use Authorization (EUA). This EUA will remain in effect (meaning this test can be used) for the duration of the COVID-19 declaration under Section 564(b)(1) of the Act, 21 U.S.C. section 360bbb-3(b)(1), unless the authorization is terminated or revoked.  Performed at [facility], [address], [place], [place] [address]   Blood Culture (routine x 2)     Status: None (Preliminary result)   Collection Time: 03/30/23  5:58 PM   Specimen: BLOOD  Result Value Ref Range Status   Specimen Description BLOOD RIGHT ANTECUBITAL  Final   Special Requests   Final    BOTTLES DRAWN AEROBIC AND ANAEROBIC Blood Culture adequate volume   Culture   Final    NO GROWTH 3 DAYS Performed at [facility], [address], [place], [place] [address]    Report Status PENDING  Incomplete  Blood Culture (routine x 2)     Status: None (Preliminary result)   Collection Time: 03/30/23  6:11 PM   Specimen: BLOOD  Result Value Ref Range Status   Specimen Description BLOOD BLOOD RIGHT HAND  Final   Special Requests AEROBIC BOTTLE ONLY Blood Culture adequate volume  Final   Culture   Final    NO GROWTH 3 DAYS Performed at [facility], [address], [place], [place] [address]    Report Status PENDING  Incomplete     Medications:    ARIPiprazole  2 mg Oral Daily   carbidopa-levodopa  3 tablet Oral TID   Chlorhexidine Gluconate Cloth  6 each Topical Daily   enoxaparin (LOVENOX) injection  30 mg Subcutaneous Q24H   polyethylene glycol  17 g Oral Daily   tamsulosin  0.4 mg  Oral Daily   Continuous Infusions:  azithromycin 500 mg (04/01/23 2258)   cefTRIAXone (ROCEPHIN)  IV     dextrose 50 mL/hr at 04/02/23 0151      LOS: 3 days   Marinda Elk  [facility]  04/02/2023, 11:55 AM

## 2023-04-02 NOTE — Progress Notes (Signed)
Modified Barium Swallow Study  Patient Details  Name: Lee Martinez MRN: [id number] Date of Birth: 1935/10/17  Today's Date: 04/02/2023  Modified Barium Swallow completed.  Full report located under Chart Review in the Imaging Section.  History of Present Illness DAVIT BRETL is an 86 y.o. male  who presented with AMS. Found to have sepsis due to PNA, encephalopathy and AKI. PMHx: Parkinson disease/advanced dementia BPH depression   Clinical Impression Pt presents with a significant oropharyngeal dysphagia that is suspected to be partially cognitive in nature, but is characterized by mistiming and weakness. Pt is lethargic and requires Max cueing to remain alert and attend to PO presentations. His cough is weak and contains substantial voicing. He has suspected cervical osteophytes and a prominent CP bar resulting in little to no pharyngeal clearance. Each trial results in severe residue with pt unable to effectively participate in strategies such as throat clearance, a chin tuck, multiple swallows, or an effortful swallow to attempt to clear. He is unable to achieve any degree of epiglottic inversion, which prevents full laryngeal closure. Pt silently aspirated thin liquids as it entered the laryngeal vestibule during the swallow and was subsequently unable to be cleared with a cued cough. It progressed below the vocal folds noted during following frames without sensation. The significant amount of residue from thin liquids, nectar thick liquids, and purees remaining in pt's valleculae eventually spills over the epiglottis and into the open laryngeal vestibule. Although aspiration of these textures was not noted throughout this study, suspect pt is not sensate to these penetrates and they likely progress past the vocal folds as pt is at rest. Given the degree of severity of these deficits, recommend pt be NPO. SLP will continue to follow to assess swallowing function and ability to initiate PO diet as  clinically indicated. Factors that may increase risk of adverse event in presence of aspiration [employer] 2021): Poor general health and/or compromised immunity;Reduced cognitive function;Limited mobility;Frail or deconditioned;Reduced saliva;Weak cough  Swallow Evaluation Recommendations Recommendations: NPO Medication Administration: Via alternative means      Gwynneth Aliment, M.A., CF-SLP Speech Language Pathology, Acute Rehabilitation Services  Secure Chat preferred [phone]  04/02/2023,1:57 PM

## 2023-04-03 DIAGNOSIS — R339 Retention of urine, unspecified: Secondary | ICD-10-CM | POA: Diagnosis not present

## 2023-04-03 DIAGNOSIS — J690 Pneumonitis due to inhalation of food and vomit: Secondary | ICD-10-CM | POA: Diagnosis not present

## 2023-04-03 DIAGNOSIS — R1311 Dysphagia, oral phase: Secondary | ICD-10-CM

## 2023-04-03 DIAGNOSIS — G20A1 Parkinson's disease without dyskinesia, without mention of fluctuations: Secondary | ICD-10-CM | POA: Diagnosis not present

## 2023-04-03 DIAGNOSIS — Z515 Encounter for palliative care: Secondary | ICD-10-CM | POA: Diagnosis not present

## 2023-04-03 DIAGNOSIS — K869 Disease of pancreas, unspecified: Secondary | ICD-10-CM | POA: Diagnosis not present

## 2023-04-03 DIAGNOSIS — A419 Sepsis, unspecified organism: Secondary | ICD-10-CM | POA: Diagnosis not present

## 2023-04-03 DIAGNOSIS — N170 Acute kidney failure with tubular necrosis: Secondary | ICD-10-CM | POA: Diagnosis not present

## 2023-04-03 LAB — BASIC METABOLIC PANEL
Anion gap: 7 (ref 5–15)
BUN: 10 mg/dL (ref 8–23)
CO2: 24 mmol/L (ref 22–32)
Calcium: 8.2 mg/dL — ABNORMAL LOW (ref 8.9–10.3)
Chloride: 110 mmol/L (ref 98–111)
Creatinine, Ser: 0.95 mg/dL (ref 0.61–1.24)
GFR, Estimated: >60 mL/min (ref >60)
Glucose, Bld: 112 mg/dL — ABNORMAL HIGH (ref 70–99)
Potassium: 3.6 mmol/L (ref 3.5–5.1)
Sodium: 141 mmol/L (ref 135–145)

## 2023-04-03 LAB — CBC WITH DIFFERENTIAL/PLATELET
Abs Immature Granulocytes: 0 10*3/uL (ref 0.00–0.07)
Basophils Absolute: 0.1 10*3/uL (ref 0.0–0.1)
Basophils Relative: 2 %
Eosinophils Absolute: 0.4 10*3/uL (ref 0.0–0.5)
Eosinophils Relative: 11 %
HCT: 37.4 % — ABNORMAL LOW (ref 39.0–52.0)
Hemoglobin: 12.4 g/dL — ABNORMAL LOW (ref 13.0–17.0)
Lymphocytes Relative: 20 %
Lymphs Abs: 0.7 10*3/uL (ref 0.7–4.0)
MCH: 30.5 pg (ref 26.0–34.0)
MCHC: 33.2 g/dL (ref 30.0–36.0)
MCV: 92.1 fL (ref 80.0–100.0)
Monocytes Absolute: 0.3 10*3/uL (ref 0.1–1.0)
Monocytes Relative: 10 %
Neutro Abs: 1.9 10*3/uL (ref 1.7–7.7)
Neutrophils Relative %: 56 %
Platelets: 196 10*3/uL (ref 150–400)
Promyelocytes Relative: 1 %
RBC: 4.06 MIL/uL — ABNORMAL LOW (ref 4.22–5.81)
RDW: 14 % (ref 11.5–15.5)
WBC: 3.4 10*3/uL — ABNORMAL LOW (ref 4.0–10.5)
nRBC: 0 % (ref 0.0–0.2)
nRBC: 0 /100{WBCs}

## 2023-04-03 MED ORDER — DEXTROSE-SODIUM CHLORIDE 5-0.45 % IV SOLN: INTRAVENOUS | Status: DC

## 2023-04-03 NOTE — Plan of Care (Signed)

## 2023-04-03 NOTE — Progress Notes (Signed)
Occupational Therapy Treatment Patient Details Name: Lee Martinez MRN: [id number] DOB: 01/19/1936 Today's Date: 04/03/2023   History of present illness Lee Martinez is an 87 y.o. male  who presented 11/17 with AMS. Found to have sepsis due to PNA, encephalopathy and AKI. PMHx: Parkinson disease/advanced dementia BPH depression.   OT comments  Pt responsive, but lethargic. Total assist for supine to sit in hopes that pt would become more alert and participatory. Washed pt's face at EOB. Pt attempting to return to supine, declining OOB to chair for breakfast. Assisted LEs back into bed. Will continue efforts. Patient will benefit from continued inpatient follow up therapy, <3 hours/day.       If plan is discharge home, recommend the following:  A lot of help with walking and/or transfers;A lot of help with bathing/dressing/bathroom;Two people to help with bathing/dressing/bathroom;Two people to help with walking and/or transfers;Assistance with cooking/housework;Assistance with feeding;Direct supervision/assist for medications management;Direct supervision/assist for financial management;Assist for transportation;Help with stairs or ramp for entrance;Supervision due to cognitive status   Equipment Recommendations  None recommended by OT    Recommendations for Other Services      Precautions / Restrictions Precautions Precautions: Fall Restrictions Weight Bearing Restrictions: No       Mobility Bed Mobility Overal bed mobility: Needs Assistance Bed Mobility: Supine to Sit, Sit to Supine, Rolling Rolling: Total assist   Supine to sit: Total assist Sit to supine: Mod assist   General bed mobility comments: pt on L side, assisted to supine and then EOB with hopes pt would become more alert, pt sat with moderate support x 5 minutes, began returning back to supine, assisted LEs into bed    Transfers                   General transfer comment: pt declined     Balance  Overall balance assessment: Needs assistance Sitting-balance support: Feet supported Sitting balance-Leahy Scale: Poor                                     ADL either performed or assessed with clinical judgement   ADL       Grooming: Total assistance;Sitting;Wash/dry face                                      Extremity/Trunk Assessment              Vision       Perception     Praxis      Cognition Arousal: Lethargic Behavior During Therapy: Flat affect Overall Cognitive Status: No family/caregiver present to determine baseline cognitive functioning                                 General Comments: following commands inconsistently        Exercises      Shoulder Instructions       General Comments      Pertinent Vitals/ Pain       Pain Assessment Pain Assessment: Faces Faces Pain Scale: No hurt  Home Living  Prior Functioning/Environment              Frequency  Min 1X/week        Progress Toward Goals  OT Goals(current goals can now be found in the care plan section)  Progress towards OT goals: Not progressing toward goals - comment  Acute Rehab OT Goals OT Goal Formulation: With patient Time For Goal Achievement: 04/14/23 Potential to Achieve Goals: Good  Plan      Co-evaluation                 AM-PAC OT "6 Clicks" Daily Activity     Outcome Measure   Help from another person eating meals?: Total Help from another person taking care of personal grooming?: Total Help from another person toileting, which includes using toliet, bedpan, or urinal?: Total Help from another person bathing (including washing, rinsing, drying)?: Total Help from another person to put on and taking off regular upper body clothing?: Total Help from another person to put on and taking off regular lower body clothing?: Total 6 Click Score: 6     End of Session    OT Visit Diagnosis: Muscle weakness (generalized) (M62.81);Other symptoms and signs involving cognitive function   Activity Tolerance Patient limited by lethargy   Patient Left in bed;with call bell/phone within reach;with bed alarm set   Nurse Communication          Time: [phone] OT Time Calculation (min): 17 min  Charges: OT General Charges $OT Visit: 1 Visit OT Treatments $Self Care/Home Management : 8-22 mins  Berna Spare, OTR/L Acute Rehabilitation Services Office: [phone]  Evern Bio 04/03/2023, 8:53 AM

## 2023-04-03 NOTE — Progress Notes (Signed)
Daily Progress Note   Patient Name: Lee Martinez       Date: 04/03/2023 DOB: [birth year]  Age: 87 y.o. MRN#: [id number] Attending Physician: Marinda Elk, MD Primary Care Physician: Pcp, No Admit Date: 03/30/2023  Reason for Consultation/Follow-up: Establishing goals of care  Patient Profile/HPI: 87 y.o. male  with past medical history of Parkinson's disease, dementia, BPH, HLD, depression, pancreatic lesion found in March without further workup- outpatient MRI was recommended at that time-  admitted on 03/30/2023 with sepsis likely due to aspiration pneumonia. He has been evaluated by SLP with [facility] and although no aspiration was detected during eval there was significant residual that is suspected to be aspirated when patient is at rest. He has been made NPO by speech. Palliative medicine consulted for GOC.    Subjective:  Chart reviewed including labs, progress notes, imaging from this and previous encounters.  Patient continues to be confused.  Prior to admission he was in assisted living. Able to ambulate minimally, very high fall risk. His friends visit him regularly and provide some of his care.  Met with Lee Martinez and Lee Martinez- his HCPOA's and friends- and Lee Martinez's wife Lee Martinez. Lee Pulling, RN case manager also participated in meeting. Patient was married, but his spouse died around 3 years ago. No children. They have seen great decline in Lee Martinez's status since the loss of his wife. He finds joy in his church and with his friends- but his grief is often present. 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  We discussed Herberth's chronic illness and acute issues. Reviewed speech pathology findings and concern for ongoing aspiration which  are likely to lead to recurrent pneumonias.  Patient would not want artificial feeding.  Hospice services and philosophy of care were also discussed.  Decision made to maximize medically this hospitalization and likely d/c back to [facility]. HCPOA's would like to research hospices before making final decision.    Review of Systems  Unable to perform ROS: Dementia     Physical Exam Vitals and nursing note reviewed.  Constitutional:      Appearance: He is ill-appearing.  Musculoskeletal:     Comments: Diffuse muscle wasting  Neurological:     Mental Status: He is alert. He is disoriented.  Vital Signs: BP 124/75 (BP Location: Right Arm)   Pulse 74   Temp 98.3 F (36.8 C) (Oral)   Resp 18   Ht 6' (1.829 m)   Wt 64.9 kg   SpO2 94%   BMI 19.40 kg/m  SpO2: SpO2: 94 % O2 Device: O2 Device: Room Air O2 Flow Rate: O2 Flow Rate (L/min): 2 L/min  Intake/output summary:  Intake/Output Summary (Last 24 hours) at 04/03/2023 1312 Last data filed at 04/03/2023 1225 Gross per 24 hour  Intake 484.48 ml  Output 1100 ml  Net -615.52 ml   LBM:   Baseline Weight: Weight: 64.9 kg Most recent weight: Weight: 64.9 kg       Palliative Assessment/Data: PPS: 40%      Patient Active Problem List   Diagnosis Date Noted   Sepsis (HCC) 03/30/2023   Acute renal failure (ARF) (HCC) 03/30/2023   Hypotension 03/30/2023   Increased anion gap metabolic acidosis 03/30/2023   Acute urinary retention 03/30/2023   Pancreatic lesion 03/30/2023   Protein-calorie malnutrition, severe 05/26/2022   Pneumonia of left lower lobe due to infectious organism 05/26/2022   Acute metabolic encephalopathy 05/26/2022   Normocytic anemia 05/25/2022   BPH with urinary obstruction 05/23/2022   Mixed hyperlipidemia 05/22/2022   S/P total right hip arthroplasty 05/21/2022   Lesion of pelvic bone 05/20/2022   Closed displaced fracture of right femoral neck (HCC) 05/20/2022   Refusal of blood  transfusions as patient is Jehovah's Witness 05/20/2022   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 05/20/2022   Major depressive disorder 06/17/2013   Mood disorder in conditions classified elsewhere 03/31/2013   Parkinson disease (HCC) 01/19/2013    Palliative Care Assessment & Plan    Assessment/Recommendations/Plan  Continue current level of care GOC- maximize this hospitalization and potentially d/c with hospice- HCPOAs wish to research hospice options- will touch base tomorrow with decision Appreciate SLP offering diet and aspiration precautions- pt would not want artificial feeding- HCPOA's accept risk of ongoing aspiration and recurrent pneumonia Greatly appreciate TOC input during meeting ans assistance with placement   Code Status: DNR  Prognosis:  < 6 months  Discharge Planning: To Be Determined  Care plan was discussed with patient and care team  Thank you for allowing the Palliative Medicine Team to assist in the care of this patient.  Total time: 90 minutes Prolonged billing:  Time includes:   Preparing to see the patient (e.g., review of tests) Obtaining and/or reviewing separately obtained history Performing a medically necessary appropriate examination and/or evaluation Counseling and educating the patient/family/caregiver Ordering medications, tests, or procedures Referring and communicating with other health care professionals (when not reported separately) Documenting clinical information in the electronic or other health record Independently interpreting results (not reported separately) and communicating results to the patient/family/caregiver Care coordination (not reported separately) Clinical documentation  Ocie Bob, AGNP-C Palliative Medicine   Please contact Palliative Medicine Team phone at [phone] for questions and concerns.

## 2023-04-03 NOTE — Progress Notes (Signed)
[facility] PROGRESS NOTE    Progress Note  JOBIN LEBERT  YIR:[id number] DOB: 1936/03/15 DOA: 03/30/2023 PCP: Pcp, No     Brief Narrative:   Lee Martinez is an 87 y.o. male past medical history of Parkinson disease/advanced dementia BPH depression comes in with altered mental status as per friend the patient was in a facility he went and saw him he was complaining of shortness of breath in the bathroom and coughing for the past few days.  He is also been having trouble eating solid foods and his urine has been more concentrated.  Assessment/Plan:   Severe sepsis 2/2 to PNA (possibly aspiration PNA): Chest x-ray showed left basilar airspace disease, with increased shortness of breath with leukopenia. Failed swallow evaluation continue IV Unasyn. Palliative care to meet with the family.  Acute metabolic encephalopathy: Likely due to infectious etiology currently on antibiotics. Resolved awake want to get out of bed.  Dysphagia: Was having trouble with his food speech was consulted and there is significant residual food in his mouth and opening of the larynx.  As he is high risk for aspiration, will keep the patient NPO. Will continue to follow the patient.  Acute kidney injury: Likely prerenal azotemia, with baseline creatinine around 1.0-1.3, on admission 4.6.  Specific gravity in on UA is 1013 Was given a voiding trial, over 1100 cc of urine output yesterday.  Hypernatremia: Failure to decrease oral intake started on D5 now resolved. As he is n.p.o. continue D5 half-normal saline.  Leukopenia/thrombocytopenia: Unclear etiology question due to infection. Check a CBC tomorrow morning.  Parkinson's disease/dementia: Resume home meds.  Pancreatic lesion: Noted on CT on March 2024. Follow-up with [facility] as an outpatient as a CT scan of the abdomen as an outpatient as cannot tolerate MRCP due to Parkinson's.  Acute urinary retention: Foley catheter placed in the  ED due to possible retention, there is also a question of UTI. Restart Flomax low threshold to DC Foley catheter.  Increased anion gap metabolic acidosis: Secondary to acute kidney injury and possibly hypotension. Now resolved.  Goals of care: The patient is currently n.p.o. for high risk of aspiration is DNR/DNI. I have tried to call call his grandson several times and have been accessible. He has a very poor prognosis due to to his significant risk of aspiration. Consulted palliative care will attempt to call the son again this afternoon.    DVT prophylaxis: lovenox Family Communication:none Status is: Inpatient Remains inpatient appropriate because: Severe sepsis due to pneumonia    Code Status:     Code Status Orders  (From admission, onward)           Start     Ordered   03/30/23 2121  Do not attempt resuscitation (DNR)- Limited -Do Not Intubate (DNI)  Continuous       Question Answer Comment  If pulseless and not breathing No CPR or chest compressions.   In Pre-Arrest Conditions (Patient Is Breathing and Has A Pulse) Do not intubate. Provide all appropriate non-invasive medical interventions. Avoid ICU transfer unless indicated or required.   Consent: Discussion documented in EHR or advanced directives reviewed      03/30/23 2120           Code Status History     Date Active Date Inactive Code Status Order ID Comments User Context   03/30/2023 1717 03/30/2023 2120 Limited: Do not attempt resuscitation (DNR) -DNR-LIMITED -Do Not Intubate/DNI  [id number]  Vanetta Mulders, MD ED  05/20/2022 2030 05/28/2022 1901 DNR [id number]  Hillary Bow, DO ED         IV Access:   Peripheral IV   Procedures and diagnostic studies:   DG Swallowing Func-Speech Pathology  Result Date: 04/02/2023 Table formatting from the original result was not included. Modified Barium Swallow Study Patient Details Name: Lee Martinez MRN: [id number] Date of Birth: [birth year]  Today's Date: 04/02/2023 HPI/PMH: HPI: Lee Martinez is an 87 y.o. male  who presented with AMS. Found to have sepsis due to PNA, encephalopathy and AKI. PMHx: Parkinson disease/advanced dementia BPH depression Clinical Impression: Clinical Impression: Pt presents with a significant oropharyngeal dysphagia that is suspected to be partially cognitive in nature, but is characterized by mistiming and weakness. Pt is lethargic and requires Max cueing to remain alert and attend to PO presentations. His cough is weak and contains substantial voicing. He has suspected cervical osteophytes and a prominent CP bar resulting in little to no pharyngeal clearance. Each trial results in severe residue with pt unable to effectively participate in strategies such as throat clearance, chin tuck, multiple swallows, or effortful swallow to attempt to clear. He is unable to achieve any degree of epiglottic inversion, which prevents full laryngeal closure. Pt silently aspirated thin liquids as it entered the laryngeal vestibule during the swallow and was subsequently unable to be cleared with a cued cough. It progressed below the vocal folds noted during following frames without sensation. The significant amount of residue from thin liquids, nectar thick liquids, and purees remaining in pt's valleculae eventually spills over the epiglottis and into the open laryngeal vestibule. Although aspiration of these textures was not noted throughout this study, suspect pt is not sensate to these penetrates and they likely progress past the vocal folds as pt is at rest. Given the degree of severity of these deficits, recommend pt be NPO. SLP will continue to follow to assess swallowing function and ability to initiate PO diet as clinically indicated. Factors that may increase risk of adverse event in presence of aspiration Rubye Oaks & Clearance Coots 2021): Factors that may increase risk of adverse event in presence of aspiration Rubye Oaks & Clearance Coots 2021):  Poor general health and/or compromised immunity; Reduced cognitive function; Limited mobility; Frail or deconditioned; Reduced saliva; Weak cough Recommendations/Plan: Swallowing Evaluation Recommendations Swallowing Evaluation Recommendations Recommendations: NPO Medication Administration: Via alternative means Treatment Plan Treatment Plan Treatment recommendations: Therapy as outlined in treatment plan below Follow-up recommendations: Skilled nursing-short term rehab (<3 hours/day) Functional status assessment: Patient has had a recent decline in their functional status and demonstrates the ability to make significant improvements in function in a reasonable and predictable amount of time. Treatment frequency: Min 2x/week Treatment duration: 2 weeks Interventions: Aspiration precaution training; Oropharyngeal exercises; Patient/family education; Trials of upgraded texture/liquids; Diet toleration management by SLP; Respiratory muscle strength training Recommendations Recommendations for follow up therapy are one component of a multi-disciplinary discharge planning process, led by the attending physician.  Recommendations may be updated based on patient status, additional functional criteria and insurance authorization. Assessment: Orofacial Exam: Orofacial Exam Oral Cavity: Oral Hygiene: Xerostomia Oral Cavity - Dentition: Adequate natural dentition Orofacial Anatomy: WFL Oral Motor/Sensory Function: WFL Anatomy: Anatomy: Suspected cervical osteophytes; Prominent cricopharyngeus Boluses Administered: Boluses Administered Boluses Administered: Thin liquids (Level 0); Mildly thick liquids (Level 2, nectar thick); Puree  Oral Impairment Domain: Oral Impairment Domain Lip Closure: No labial escape Tongue control during bolus hold: Posterior escape of greater than half of bolus Bolus preparation/mastication: Timely and efficient  chewing and mashing Bolus transport/lingual motion: Delayed initiation of tongue motion  (oral holding) Oral residue: Residue collection on oral structures Location of oral residue : Tongue; Palate Initiation of pharyngeal swallow : Pyriform sinuses  Pharyngeal Impairment Domain: Pharyngeal Impairment Domain Soft palate elevation: No bolus between soft palate (SP)/pharyngeal wall (PW) Laryngeal elevation: Partial superior movement of thyroid cartilage/partial approximation of arytenoids to epiglottic petiole Anterior hyoid excursion: Complete anterior movement Epiglottic movement: No inversion Laryngeal vestibule closure: Incomplete, narrow column air/contrast in laryngeal vestibule Pharyngeal stripping wave : Present - complete Pharyngeal contraction (A/P view only): N/A Pharyngoesophageal segment opening: Partial distention/partial duration, partial obstruction of flow Tongue base retraction: Wide column of contrast or air between tongue base and PPW Pharyngeal residue: Majority of contrast within or on pharyngeal structures Location of pharyngeal residue: Tongue base; Valleculae; Pharyngeal wall; Pyriform sinuses; Diffuse (>3 areas)  Esophageal Impairment Domain: Esophageal Impairment Domain Esophageal clearance upright position: Complete clearance, esophageal coating Pill: No data recorded Penetration/Aspiration Scale Score: Penetration/Aspiration Scale Score 3.  Material enters airway, remains ABOVE vocal cords and not ejected out: Mildly thick liquids (Level 2, nectar thick); Puree 8.  Material enters airway, passes BELOW cords without attempt by patient to eject out (silent aspiration) : Thin liquids (Level 0) Compensatory Strategies: Compensatory Strategies Compensatory strategies: Yes Straw: Ineffective Ineffective Straw: Thin liquid (Level 0); Mildly thick liquid (Level 2, nectar thick) Effortful swallow: Ineffective Ineffective Effortful Swallow: Thin liquid (Level 0); Mildly thick liquid (Level 2, nectar thick); Puree Multiple swallows: Ineffective Ineffective Multiple Swallows: Thin liquid  (Level 0); Mildly thick liquid (Level 2, nectar thick); Puree Chin tuck: Ineffective Ineffective Chin Tuck: Thin liquid (Level 0) Liquid wash: Ineffective Ineffective Liquid Wash: Thin liquid (Level 0); Mildly thick liquid (Level 2, nectar thick); Puree   General Information: Caregiver present: No  Diet Prior to this Study: Dysphagia 3 (mechanical soft); Mildly thick liquids (Level 2, nectar thick)   Temperature : Normal   Respiratory Status: WFL   Supplemental O2: None (Room air)   History of Recent Intubation: No  Behavior/Cognition: Cooperative; Pleasant mood; Requires cueing; Lethargic/Drowsy Self-Feeding Abilities: Dependent for feeding Baseline vocal quality/speech: Hypophonia/low volume Volitional Cough: Able to elicit Volitional Swallow: Able to elicit Exam Limitations: Fatigue Goal Planning: Prognosis for improved oropharyngeal function: Fair Barriers to Reach Goals: Cognitive deficits; Time post onset; Severity of deficits No data recorded Patient/Family Stated Goal: none stated Consulted and agree with results and recommendations: Patient Pain: Pain Assessment Pain Assessment: No/denies pain Faces Pain Scale: 0 End of Session: Start Time:SLP Start Time (ACUTE ONLY): 1255 Stop Time: SLP Stop Time (ACUTE ONLY): 1315 Time Calculation:SLP Time Calculation (min) (ACUTE ONLY): 20 min Charges: SLP Evaluations $ SLP Speech Visit: 1 Visit SLP Evaluations $BSS Swallow: 1 Procedure $MBS Swallow: 1 Procedure SLP visit diagnosis: SLP Visit Diagnosis: Dysphagia, oropharyngeal phase (R13.12) Past Medical History: Past Medical History: Diagnosis Date  Parkinson's disease (HCC) 07/2011  followed by [facility] Past Surgical History: Past Surgical History: Procedure Laterality Date  basil removal    HERNIA REPAIR    THYROID SURGERY    TOTAL HIP ARTHROPLASTY Right 05/21/2022  Procedure: TOTAL HIP ARTHROPLASTY ANTERIOR APPROACH;  Surgeon: Durene Romans, MD;  Location: [facility];  Service: Orthopedics;  Laterality: Right;  Gwynneth Aliment, M.A., CF-SLP Speech Language Pathology, Acute Rehabilitation Services Secure Chat preferred [phone] 04/02/2023, 2:01 PM    Medical Consultants:   None.   Subjective:    Gertie Exon no complaints.  Objective:    Vitals:  04/02/23 1958 04/03/23 0026 04/03/23 0425 04/03/23 0821  BP: 125/85 124/89 124/77 124/75  Pulse: 73 80 76 74  Resp: 18 18 18    Temp: (!) 97.2 F (36.2 C) 97.7 F (36.5 C) 97.7 F (36.5 C) 98.3 F (36.8 C)  TempSrc: Oral Oral Oral Oral  SpO2: 95% 94% 94% 94%  Weight:      Height:       SpO2: 94 % O2 Flow Rate (L/min): 2 L/min   Intake/Output Summary (Last 24 hours) at 04/03/2023 0854 Last data filed at 04/03/2023 0616 Gross per 24 hour  Intake 480 ml  Output 1100 ml  Net -620 ml   Filed Weights   03/30/23 2303  Weight: 64.9 kg    Exam: General exam: In no acute distress. Respiratory system: Good air movement and clear to auscultation. Cardiovascular system: S1 & S2 heard, RRR. No JVD. Gastrointestinal system: Abdomen is nondistended, soft and nontender.  Extremities: No pedal edema. Skin: No rashes, lesions or ulcers Psychiatry: No judgement or insight appear normal.  Data Reviewed:    Labs: Basic Metabolic Panel: Recent Labs  Lab 03/30/23 1700 03/31/23 1001 04/01/23 1123 04/02/23 1641  NA 142 144 148* 142  K 4.2 3.8 3.6 4.3  CL 110 116* 118* 112*  CO2 16* 21* 21* 21*  GLUCOSE 194* 114* 103* 112*  BUN 122* 66* 32* 15  CREATININE 4.68* 1.76* 0.92 0.85  CALCIUM 8.7* 8.3* 8.4* 8.2*   GFR Estimated Creatinine Clearance: 58.3 mL/min (by C-G formula based on SCr of 0.85 mg/dL). Liver Function Tests: Recent Labs  Lab 03/30/23 1700  AST 44*  ALT 13  ALKPHOS 58  BILITOT 0.4  PROT 6.2*  ALBUMIN 2.5*   No results for input(s): "LIPASE", "AMYLASE" in the last 168 hours. No results for input(s): "AMMONIA" in the last 168 hours. Coagulation profile Recent Labs  Lab 03/30/23 1700  INR 1.3*   COVID-19  Labs  No results for input(s): "DDIMER", "FERRITIN", "LDH", "CRP" in the last 72 hours.  Lab Results  Component Value Date   SARSCOV2NAA NEGATIVE 03/30/2023    CBC: Recent Labs  Lab 03/30/23 1700 03/31/23 0555  WBC 2.8* 2.5*  NEUTROABS 1.8  --   HGB 12.0* 10.7*  HCT 38.1* 32.9*  MCV 94.5 94.0  PLT 140* 107*   Cardiac Enzymes: No results for input(s): "CKTOTAL", "CKMB", "CKMBINDEX", "TROPONINI" in the last 168 hours. BNP (last 3 results) No results for input(s): "PROBNP" in the last 8760 hours. CBG: Recent Labs  Lab 03/30/23 1818 03/30/23 2241 03/31/23 2204  GLUCAP 158* 136* 131*   D-Dimer: No results for input(s): "DDIMER" in the last 72 hours. Hgb A1c: No results for input(s): "HGBA1C" in the last 72 hours. Lipid Profile: No results for input(s): "CHOL", "HDL", "LDLCALC", "TRIG", "CHOLHDL", "LDLDIRECT" in the last 72 hours. Thyroid function studies: No results for input(s): "TSH", "T4TOTAL", "T3FREE", "THYROIDAB" in the last 72 hours.  Invalid input(s): "FREET3" Anemia work up: No results for input(s): "VITAMINB12", "FOLATE", "FERRITIN", "TIBC", "IRON", "RETICCTPCT" in the last 72 hours. Sepsis Labs: Recent Labs  Lab 03/30/23 1700 03/30/23 1819 03/30/23 1953 03/31/23 0555  WBC 2.8*  --   --  2.5*  LATICACIDVEN  --  3.3* 1.8  --    Microbiology Recent Results (from the past 240 hour(s))  Urine Culture     Status: None   Collection Time: 03/30/23  4:55 PM   Specimen: Urine, Random  Result Value Ref Range Status   Specimen Description URINE, RANDOM  Final   Special Requests NONE Reflexed from [id number]  Final   Culture   Final    NO GROWTH Performed at [facility], [address] [place], [place] [address]    Report Status 03/31/2023 FINAL  Final  Resp panel by RT-PCR (RSV, Flu A&B, Covid) Anterior Nasal Swab     Status: None   Collection Time: 03/30/23  5:13 PM   Specimen: Anterior Nasal Swab  Result Value Ref Range Status   SARS Coronavirus  2 by RT PCR NEGATIVE NEGATIVE Final   Influenza A by PCR NEGATIVE NEGATIVE Final   Influenza B by PCR NEGATIVE NEGATIVE Final    Comment: (NOTE) The Xpert Xpress SARS-CoV-2/FLU/RSV plus assay is intended as an aid in the diagnosis of influenza from Nasopharyngeal swab specimens and should not be used as a sole basis for treatment. Nasal washings and aspirates are unacceptable for Xpert Xpress SARS-CoV-2/FLU/RSV testing.  Fact Sheet for Patients: BloggerCourse.com  Fact Sheet for Healthcare Providers: SeriousBroker.it  This test is not yet approved or cleared by the [place] FDA and has been authorized for detection and/or diagnosis of SARS-CoV-2 by FDA under an Emergency Use Authorization (EUA). This EUA will remain in effect (meaning this test can be used) for the duration of the COVID-19 declaration under Section 564(b)(1) of the Act, 21 U.S.C. section 360bbb-3(b)(1), unless the authorization is terminated or revoked.     Resp Syncytial Virus by PCR NEGATIVE NEGATIVE Final    Comment: (NOTE) Fact Sheet for Patients: BloggerCourse.com  Fact Sheet for Healthcare Providers: SeriousBroker.it  This test is not yet approved or cleared by the [place] FDA and has been authorized for detection and/or diagnosis of SARS-CoV-2 by FDA under an Emergency Use Authorization (EUA). This EUA will remain in effect (meaning this test can be used) for the duration of the COVID-19 declaration under Section 564(b)(1) of the Act, 21 U.S.C. section 360bbb-3(b)(1), unless the authorization is terminated or revoked.  Performed at [facility], [address] [place], [place] [address]   Blood Culture (routine x 2)     Status: None (Preliminary result)   Collection Time: 03/30/23  5:58 PM   Specimen: BLOOD  Result Value Ref Range Status   Specimen Description BLOOD RIGHT  ANTECUBITAL  Final   Special Requests   Final    BOTTLES DRAWN AEROBIC AND ANAEROBIC Blood Culture adequate volume   Culture   Final    NO GROWTH 4 DAYS Performed at [facility], [address], [place], [place] [address]    Report Status PENDING  Incomplete  Blood Culture (routine x 2)     Status: None (Preliminary result)   Collection Time: 03/30/23  6:11 PM   Specimen: BLOOD  Result Value Ref Range Status   Specimen Description BLOOD BLOOD RIGHT HAND  Final   Special Requests AEROBIC BOTTLE ONLY Blood Culture adequate volume  Final   Culture   Final    NO GROWTH 4 DAYS Performed at [facility], [address] [place], [place] [address]    Report Status PENDING  Incomplete     Medications:    ARIPiprazole  2 mg Oral Daily   carbidopa-levodopa  3 tablet Oral TID   Chlorhexidine Gluconate Cloth  6 each Topical Daily   enoxaparin (LOVENOX) injection  30 mg Subcutaneous Q24H   polyethylene glycol  17 g Oral Daily   tamsulosin  0.4 mg Oral Daily   Continuous Infusions:  ampicillin-sulbactam (UNASYN) IV 1.5 g (04/03/23 0448)      LOS: 4 days   Marinda Elk  [facility]  04/03/2023, 8:54 AM

## 2023-04-03 NOTE — Progress Notes (Signed)
Physical Therapy Treatment Patient Details Name: Lee Martinez MRN: [id number] DOB: [birth year] Today's Date: 04/03/2023   History of Present Illness Lee Martinez is an 87 y.o. male  who presented 11/17 with AMS. Found to have sepsis due to PNA, encephalopathy and AKI. PMHx: Parkinson disease/advanced dementia BPH depression.    PT Comments  Agreeable to work with therapy, eager to ambulate and get OOB once more alert and sitting EOB. Mod assist for most mobility assessed today. Needs continuous support for RW control and to facilitate forward momentum to stand and step. Frequent freezing but responds well to tactile cues to facilitate weight-shifting with steps. Performed stand pivot transfer, mod assist with hand held support, gait with RW at mod A level, and sit to stand from various surfaces again with mod assist for boost and balance. Patient will continue to benefit from skilled physical therapy services to further improve independence with functional mobility.     If plan is discharge home, recommend the following: A lot of help with walking and/or transfers;A lot of help with bathing/dressing/bathroom;Assistance with cooking/housework;Assistance with feeding;Direct supervision/assist for medications management;Direct supervision/assist for financial management;Assist for transportation;Supervision due to cognitive status   Can travel by private vehicle      (yes)  Equipment Recommendations  None recommended by PT    Recommendations for Other Services       Precautions / Restrictions Precautions Precautions: Fall Restrictions Weight Bearing Restrictions: No     Mobility  Bed Mobility Overal bed mobility: Needs Assistance Bed Mobility: Rolling, Sidelying to Sit Rolling: Mod assist Sidelying to sit: Mod assist, HOB elevated, Used rails       General bed mobility comments: Mod assist to roll and bring LEs out of bed. Difficulty sequencing and facilitating without assist. Once  EOB assisted to scoot forward.    Transfers Overall transfer level: Needs assistance Equipment used: Rolling walker (2 wheels), 1 person hand held assist Transfers: Sit to/from Stand, Bed to chair/wheelchair/BSC Sit to Stand: Mod assist Stand pivot transfers: Mod assist, From elevated surface         General transfer comment: Mod assist for boost and balance, to facilitate rise from elevated bed surface and again from reclining chair. Initially performed stand pivot transfer with hand held assist, mod assist for balance and to sequence LEs.  Max cues to reach back for arm rests of chair without successfully completing, guided pt into chair.    Ambulation/Gait Ambulation/Gait assistance: Mod assist Gait Distance (Feet): 35 Feet Assistive device: Rolling walker (2 wheels) Gait Pattern/deviations: Step-through pattern, Decreased stride length, Decreased dorsiflexion - right, Decreased dorsiflexion - left, Knee flexed in stance - right, Knee flexed in stance - left, Shuffle, Festinating, Narrow base of support, Trunk flexed Gait velocity: dec Gait velocity interpretation: <1.31 ft/sec, indicative of household ambulator   General Gait Details: Shuffled, festinating, freezing, narrow BOS. Multimodal cues to facilitate continous steps with larger stride. Mod assist for RW control and when stepping backwards and turning towards chair for balance and sequencing of LEs. VC for upright posture and forward gaze. No buckling this date. Seems to respond intermittently well to facilitating steps with light weight shifting cues.   Stairs             Wheelchair Mobility     Tilt Bed    Modified Rankin (Stroke Patients Only)       Balance Overall balance assessment: Needs assistance Sitting-balance support: Feet supported, No upper extremity supported Sitting balance-Leahy Scale: Fair  Standing balance support: Single extremity supported, During functional activity Standing  balance-Leahy Scale: Poor                              Cognition Arousal: Lethargic Behavior During Therapy: Flat affect Overall Cognitive Status: No family/caregiver present to determine baseline cognitive functioning                                          Exercises General Exercises - Lower Extremity Ankle Circles/Pumps: AROM, Both, 5 reps, Seated Long Arc Quad: AAROM, Strengthening, Both, 10 reps, Seated    General Comments General comments (skin integrity, edema, etc.): VSS      Pertinent Vitals/Pain Pain Assessment Pain Assessment: No/denies pain    Home Living                          Prior Function            PT Goals (current goals can now be found in the care plan section) Acute Rehab PT Goals Patient Stated Goal: none stated PT Goal Formulation: With patient Time For Goal Achievement: 04/14/23 Potential to Achieve Goals: Good Progress towards PT goals: Progressing toward goals    Frequency    Min 1X/week      PT Plan      Co-evaluation              AM-PAC PT "6 Clicks" Mobility   Outcome Measure  Help needed turning from your back to your side while in a flat bed without using bedrails?: A Little Help needed moving from lying on your back to sitting on the side of a flat bed without using bedrails?: A Lot Help needed moving to and from a bed to a chair (including a wheelchair)?: A Lot Help needed standing up from a chair using your arms (e.g., wheelchair or bedside chair)?: A Lot Help needed to walk in hospital room?: A Lot Help needed climbing 3-5 steps with a railing? : Total 6 Click Score: 12    End of Session Equipment Utilized During Treatment: Gait belt Activity Tolerance: Patient tolerated treatment well Patient left: in chair;with call bell/phone within reach;with chair alarm set;with restraints reapplied   PT Visit Diagnosis: Unsteadiness on feet (R26.81);Other abnormalities of gait and  mobility (R26.89);Muscle weakness (generalized) (M62.81);Other symptoms and signs involving the nervous system (R29.898)     Time: 1610-9604 PT Time Calculation (min) (ACUTE ONLY): 22 min  Charges:    $Gait Training: 8-22 mins PT General Charges $$ ACUTE PT VISIT: 1 Visit                     Kathlyn Sacramento, PT, DPT [facility]  Rehabilitation Services Physical Therapist Office: [phone] Website: [email or web address]    Berton Mount 04/03/2023, 12:04 PM

## 2023-04-03 NOTE — Progress Notes (Signed)
Speech Language Pathology Treatment: Dysphagia  Patient Details Name: Lee Martinez MRN: [id number] DOB: 05/01/1936 Today's Date: 04/03/2023 Time: 1345-1401 SLP Time Calculation (min) (ACUTE ONLY): 16 min  Assessment / Plan / Recommendation Clinical Impression  Pt made NPO yesterday after MBS. Notified by Palliative care NP that pt's HCPOA does not desire artificial feeding and has decided to accept risks of aspiration with potential d/c with hospice. Today pt was awake, little drowsy, kept eyes closed but conversive with therapist. He was given trials x 3 of solid texture which he readily masticated without evidence of oral residue. There was intermittent immediate and delayed coughing with thin water that was weak and did not appear to cause distress for pt. During MBS he had significant pharyngeal residue therefore he was cued to swallow a second time which he was inconsistently able to perform and SLP alternated liquids and solids. SLP wrote orders for regular texture, thin liquids. Recommend upright position, multiple swallows as pt able and alternate liquids and solids and crush pills. ST will sign off at this time. Educated pt's RN on recommendations and precautions.    HPI HPI: Lee Martinez is an 87 y.o. male  who presented with AMS. Found to have sepsis due to PNA, encephalopathy and AKI. PMHx: Parkinson disease/advanced dementia BPH depression      SLP Plan  All goals met;Discharge SLP treatment due to (comment) (family accepting aspiration risk per Palliative care meeting)      Recommendations for follow up therapy are one component of a multi-disciplinary discharge planning process, led by the attending physician.  Recommendations may be updated based on patient status, additional functional criteria and insurance authorization.    Recommendations  Diet recommendations: Regular;Thin liquid Liquids provided via: Cup;Straw Medication Administration: Crushed with puree Supervision:  Staff to assist with self feeding;Full supervision/cueing for compensatory strategies Compensations: Slow rate;Small sips/bites;Multiple dry swallows after each bite/sip;Follow solids with liquid Postural Changes and/or Swallow Maneuvers: Seated upright 90 degrees                  Oral care BID   Frequent or constant Supervision/Assistance Dysphagia, oropharyngeal phase (R13.12)     All goals met;Discharge SLP treatment due to (comment) (family accepting aspiration risk per Palliative care meeting)     Royce Macadamia  04/03/2023, 2:10 PM

## 2023-04-04 DIAGNOSIS — N170 Acute kidney failure with tubular necrosis: Secondary | ICD-10-CM | POA: Diagnosis not present

## 2023-04-04 DIAGNOSIS — R6520 Severe sepsis without septic shock: Secondary | ICD-10-CM | POA: Diagnosis not present

## 2023-04-04 DIAGNOSIS — A419 Sepsis, unspecified organism: Secondary | ICD-10-CM | POA: Diagnosis not present

## 2023-04-04 DIAGNOSIS — G20A1 Parkinson's disease without dyskinesia, without mention of fluctuations: Secondary | ICD-10-CM | POA: Diagnosis not present

## 2023-04-04 DIAGNOSIS — R339 Retention of urine, unspecified: Secondary | ICD-10-CM | POA: Diagnosis not present

## 2023-04-04 DIAGNOSIS — Z7189 Other specified counseling: Secondary | ICD-10-CM | POA: Diagnosis not present

## 2023-04-04 LAB — CBC WITH DIFFERENTIAL/PLATELET
Abs Immature Granulocytes: 0.15 10*3/uL — ABNORMAL HIGH (ref 0.00–0.07)
Basophils Absolute: 0 10*3/uL (ref 0.0–0.1)
Basophils Relative: 1 %
Eosinophils Absolute: 0.4 10*3/uL (ref 0.0–0.5)
Eosinophils Relative: 9 %
HCT: 34.8 % — ABNORMAL LOW (ref 39.0–52.0)
Hemoglobin: 11.2 g/dL — ABNORMAL LOW (ref 13.0–17.0)
Immature Granulocytes: 4 %
Lymphocytes Relative: 21 %
Lymphs Abs: 0.9 10*3/uL (ref 0.7–4.0)
MCH: 29.5 pg (ref 26.0–34.0)
MCHC: 32.2 g/dL (ref 30.0–36.0)
MCV: 91.6 fL (ref 80.0–100.0)
Monocytes Absolute: 0.7 10*3/uL (ref 0.1–1.0)
Monocytes Relative: 17 %
Neutro Abs: 2 10*3/uL (ref 1.7–7.7)
Neutrophils Relative %: 48 %
Platelets: 175 10*3/uL (ref 150–400)
RBC: 3.8 MIL/uL — ABNORMAL LOW (ref 4.22–5.81)
RDW: 13.9 % (ref 11.5–15.5)
WBC: 4.2 10*3/uL (ref 4.0–10.5)
nRBC: 0 % (ref 0.0–0.2)

## 2023-04-04 LAB — BASIC METABOLIC PANEL
Anion gap: 8 (ref 5–15)
BUN: 7 mg/dL — ABNORMAL LOW (ref 8–23)
CO2: 26 mmol/L (ref 22–32)
Calcium: 8 mg/dL — ABNORMAL LOW (ref 8.9–10.3)
Chloride: 106 mmol/L (ref 98–111)
Creatinine, Ser: 0.87 mg/dL (ref 0.61–1.24)
GFR, Estimated: >60 mL/min (ref >60)
Glucose, Bld: 105 mg/dL — ABNORMAL HIGH (ref 70–99)
Potassium: 3.3 mmol/L — ABNORMAL LOW (ref 3.5–5.1)
Sodium: 140 mmol/L (ref 135–145)

## 2023-04-04 LAB — CULTURE, BLOOD (ROUTINE X 2)
Culture: NO GROWTH
Culture: NO GROWTH
Special Requests: ADEQUATE
Special Requests: ADEQUATE

## 2023-04-04 MED ORDER — AMOXICILLIN-POT CLAVULANATE 600-42.9 MG/5ML PO SUSR
600.0000 mg | Freq: Two times a day (BID) | ORAL | Status: DC
  Administered 2023-04-04 – 2023-04-06 (×5): 600 mg via ORAL
  Filled 2023-04-04 (×7): qty 5

## 2023-04-04 MED ORDER — AMOXICILLIN-POT CLAVULANATE 600-42.9 MG/5ML PO SUSR: 600.0000 mg | Freq: Two times a day (BID) | ORAL | 0 refills | Status: AC

## 2023-04-04 NOTE — Plan of Care (Signed)
  Problem: Education: Goal: Knowledge of General Education information will improve Description: Including pain rating scale, medication(s)/side effects and non-pharmacologic comfort measures Outcome: Progressing   Problem: Clinical Measurements: Goal: Will remain free from infection Outcome: Progressing   Problem: Pain Management: Goal: General experience of comfort will improve Outcome: Progressing   Problem: Nutrition: Goal: Adequate nutrition will be maintained Outcome: Progressing

## 2023-04-04 NOTE — Progress Notes (Signed)
Daily Progress Note   Patient Name: Lee Martinez       Date: 04/04/2023 DOB: 1936-01-08  Age: 87 y.o. MRN#: [id number] Attending Physician: Marinda Elk, MD Primary Care Physician: Pcp, No Admit Date: 03/30/2023  Reason for Consultation/Follow-up: Establishing goals of care  Patient Profile/HPI: 87 y.o. male with past medical history of Parkinson's disease, dementia, BPH, HLD, depression, pancreatic lesion found in March without further workup- outpatient MRI was recommended at that time- admitted on 03/30/2023 with sepsis likely due to aspiration pneumonia. He has been evaluated by SLP with MBS and although no aspiration was detected during eval there was significant residual that is suspected to be aspirated when patient is at rest. He has been made NPO by speech. Palliative medicine consulted for GOC.   Subjective: Chart reviewed including labs, progress notes, imaging from this and previous encounters.  Evaluated patient, he was sleeping.  Discussed with [facility] care manager and Riley Lam, patient's HCPOA. Decision has been made for him to return to [facility] with hospice.  Riley Lam doesn't have any further questions or needs.   Review of Systems  Unable to perform ROS: Dementia     Physical Exam Vitals and nursing note reviewed.  Constitutional:      Appearance: He is ill-appearing.     Comments: cachetic  Neurological:     Comments: sleeping             Vital Signs: BP 134/88 (BP Location: Right Arm)   Pulse 65   Temp (!) 97.3 F (36.3 C)   Resp 17   Ht 6' (1.829 m)   Wt 64.9 kg   SpO2 99%   BMI 19.40 kg/m  SpO2: SpO2: 99 % O2 Device: O2 Device: Room Air O2 Flow Rate: O2 Flow Rate (L/min): 2 L/min  Intake/output summary:  Intake/Output Summary (Last 24 hours)  at 04/04/2023 1406 Last data filed at 04/04/2023 0958 Gross per 24 hour  Intake 1208.34 ml  Output 1875 ml  Net -666.66 ml   LBM:   Baseline Weight: Weight: 64.9 kg Most recent weight: Weight: 64.9 kg       Palliative Assessment/Data: PPS:  50%      Patient Active Problem List   Diagnosis Date Noted   Sepsis (HCC) 03/30/2023   Acute renal failure (ARF) (HCC) 03/30/2023  Hypotension 03/30/2023   Increased anion gap metabolic acidosis 03/30/2023   Acute urinary retention 03/30/2023   Pancreatic lesion 03/30/2023   Protein-calorie malnutrition, severe 05/26/2022   Pneumonia of left lower lobe due to infectious organism 05/26/2022   Acute metabolic encephalopathy 05/26/2022   Normocytic anemia 05/25/2022   BPH with urinary obstruction 05/23/2022   Mixed hyperlipidemia 05/22/2022   S/P total right hip arthroplasty 05/21/2022   Lesion of pelvic bone 05/20/2022   Closed displaced fracture of right femoral neck (HCC) 05/20/2022   Refusal of blood transfusions as patient is Jehovah's Witness 05/20/2022   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 05/20/2022   Major depressive disorder 06/17/2013   Mood disorder in conditions classified elsewhere 03/31/2013   Parkinson disease (HCC) 01/19/2013    Palliative Care Assessment & Plan    Assessment/Recommendations/Plan  Plan for discharge to SNF with hospice    Code Status: DNR  Prognosis:  < 6 months  Discharge Planning: Home with Hospice  Care plan was discussed with HCPOA and care team.   Thank you for allowing the Palliative Medicine Team to assist in the care of this patient.  Total time: 25 minutes Prolonged billing:  Time includes:   Preparing to see the patient (e.g., review of tests) Obtaining and/or reviewing separately obtained history Performing a medically necessary appropriate examination and/or evaluation Counseling and educating the patient/family/caregiver Ordering  medications, tests, or procedures Referring and communicating with other health care professionals (when not reported separately) Documenting clinical information in the electronic or other health record Independently interpreting results (not reported separately) and communicating results to the patient/family/caregiver Care coordination (not reported separately) Clinical documentation  Ocie Bob, AGNP-C Palliative Medicine   Please contact Palliative Medicine Team phone at [phone] for questions and concerns.

## 2023-04-04 NOTE — Progress Notes (Signed)
Mobility Specialist Progress Note:   04/04/23 1520  Mobility  Activity Ambulated with assistance to bathroom (bathroom>Chair)  Level of Assistance Minimal assist, patient does 75% or more  Assistive Device Front wheel walker  Distance Ambulated (ft) 12 ft  Activity Response Tolerated well  Mobility Referral Yes  $Mobility charge 1 Mobility  Mobility Specialist Start Time (ACUTE ONLY) 1520  Mobility Specialist Stop Time (ACUTE ONLY) 1527  Mobility Specialist Time Calculation (min) (ACUTE ONLY) 7 min   Pt requested assistance to ambulate to bathroom from bed. Required MinA to stand at bedside with RW. Tolerated well, knee weakness but otherwise asx throughout. Pt agreeable to sit in chair, alarm on, RN notified,call bell in reach.   Feliciana Rossetti Mobility Specialist Please contact via Special educational needs teacher or  Rehab office at [phone]

## 2023-04-04 NOTE — Progress Notes (Signed)
Nutrition Follow-up  DOCUMENTATION CODES:   Severe malnutrition in context of chronic illness  INTERVENTION:  Liberalize diet if pt does not discharge today.    NUTRITION DIAGNOSIS:   Severe Malnutrition related to chronic illness as evidenced by severe muscle depletion, severe fat depletion.    GOAL:   Patient will meet greater than or equal to 90% of their needs    MONITOR:   Diet advancement, Weight trends  REASON FOR ASSESSMENT:   Malnutrition Screening Tool    ASSESSMENT:   87 y.o. M Admitted from long term care facility due to AMS. Current problem list: Sepsis secondary to CAP, acute metabolic encephalopathy secondary to sepsis.   PMH; parkinson's disease/advanced dementia, BPH, HLD, depression, pancreatic lesion .  Review of EMR revealed; Possible discharge back to SNF today per  MD progress note. Diet advanced to Low sodium heart healthy. Family considering hospice.   Patient did not wake to RD's voice this a.m. RD deferred further conversation since patient would not wake. There is no benefit to excessive dietary restrictions related advanced age, increased nutrient needs . Patient would benefit better from liberalized diet to help meet increased needs and promote better oral intake. Should Pt not d/c by end of day will liberalize diet.    NUTRITION - FOCUSED PHYSICAL EXAM:  Flowsheet Row Most Recent Value  Orbital Region Severe depletion  Upper Arm Region Severe depletion  Thoracic and Lumbar Region Severe depletion  Buccal Region Severe depletion  Temple Region Severe depletion  Clavicle Bone Region Severe depletion  Clavicle and Acromion Bone Region Severe depletion  Scapular Bone Region Severe depletion  Dorsal Hand Severe depletion  Patellar Region Severe depletion  Anterior Thigh Region Severe depletion  Posterior Calf Region Severe depletion  Edema (RD Assessment) None  Hair Reviewed  Eyes Reviewed  Mouth Unable to assess  Skin Reviewed   Nails Reviewed       Diet Order:   Diet Order             Diet - low sodium heart healthy           Diet regular Room service appropriate? No; Fluid consistency: Thin  Diet effective now                   EDUCATION NEEDS:   Not appropriate for education at this time  Skin:  Skin Assessment: Reviewed RN Assessment  Last BM:  PTA  Height:   Ht Readings from Last 1 Encounters:  03/30/23 6' (1.829 m)    Weight:   Wt Readings from Last 1 Encounters:  03/30/23 64.9 kg    Ideal Body Weight:     BMI:  Body mass index is 19.4 kg/m.  Estimated Nutritional Needs:   Kcal:  1950-2275kcal/d  Protein:  85-100g/d  Fluid:  20ml/kcal    Jamelle Haring RDN, LDN Clinical Dietitian  RDN pager # available on Amion

## 2023-04-04 NOTE — [place] FL2 (Signed)
[place] MEDICAID FL2 LEVEL OF CARE FORM     IDENTIFICATION  Patient Name: Lee Martinez Birthdate: 24-Apr-1936 Sex: male Admission Date (Current Location): 03/30/2023  [facility] and [place] Number:  Producer, television/film/video and Address:  [facility]. [facility], [address] [place], [place] [address]      Provider Number: [id number]  Attending Physician Name and Address:  Marinda Elk, MD  Relative Name and Phone Number:  Lee Martinez - [phone]    Current Level of Care: Hospital Recommended Level of Care: Assisted Living Facility Prior Approval Number:    Date Approved/Denied:   PASRR Number:    Discharge Plan: Other (Comment) (Assisted Living Facility)    Current Diagnoses: Patient Active Problem List   Diagnosis Date Noted   Sepsis (HCC) 03/30/2023   Acute renal failure (ARF) (HCC) 03/30/2023   Hypotension 03/30/2023   Increased anion gap metabolic acidosis 03/30/2023   Acute urinary retention 03/30/2023   Pancreatic lesion 03/30/2023   Protein-calorie malnutrition, severe 05/26/2022   Pneumonia of left lower lobe due to infectious organism 05/26/2022   Acute metabolic encephalopathy 05/26/2022   Normocytic anemia 05/25/2022   BPH with urinary obstruction 05/23/2022   Mixed hyperlipidemia 05/22/2022   S/P total right hip arthroplasty 05/21/2022   Lesion of pelvic bone 05/20/2022   Closed displaced fracture of right femoral neck (HCC) 05/20/2022   Refusal of blood transfusions as patient is Jehovah's Witness 05/20/2022   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 05/20/2022   Major depressive disorder 06/17/2013   Mood disorder in conditions classified elsewhere 03/31/2013   Parkinson disease (HCC) 01/19/2013    Orientation RESPIRATION BLADDER Height & Weight     Self  Normal Indwelling catheter, Incontinent Weight: 143 lb 1.3 oz (64.9 kg) Height:  6' (182.9 cm)  BEHAVIORAL SYMPTOMS/MOOD  NEUROLOGICAL BOWEL NUTRITION STATUS      Continent Diet (See Discharge Summary)  AMBULATORY STATUS COMMUNICATION OF NEEDS Skin   Extensive Assist Verbally Normal                       Personal Care Assistance Level of Assistance  Bathing, Feeding, Dressing Bathing Assistance: Maximum assistance Feeding assistance: Limited assistance Dressing Assistance: Maximum assistance Total Care Assistance: Limited assistance   Functional Limitations Info  Sight, Hearing, Speech Sight Info: Impaired Hearing Info: Impaired Speech Info: Adequate    SPECIAL CARE FACTORS FREQUENCY  PT (By licensed PT), OT (By licensed OT)     PT Frequency: PT eval/treat OT Frequency: PT eval/treat            Contractures Contractures Info: Not present    Additional Factors Info  Code Status, Allergies, Psychotropic Code Status Info: DNR Allergies Info: NKDA Psychotropic Info: Abilify, Sinemet         Current Medications (04/04/2023):  This is the current hospital active medication list Current Facility-Administered Medications  Medication Dose Route Frequency Provider Last Rate Last Admin   amoxicillin-clavulanate (AUGMENTIN) 600-42.9 MG/5ML suspension 600 mg  600 mg Oral BID Marinda Elk, MD   600 mg at 04/04/23 1110   ARIPiprazole (ABILIFY) tablet 2 mg  2 mg Oral Daily Marinda Elk, MD   2 mg at 04/04/23 1110   carbidopa-levodopa (SINEMET IR) 25-100 MG per tablet immediate release 3 tablet  3 tablet Oral TID Marinda Elk, MD   3 tablet at 04/04/23 1110   Chlorhexidine Gluconate Cloth 2 % PADS 6 each  6 each Topical Daily Marinda Elk, MD   6 each at 04/04/23 1115   enoxaparin (LOVENOX) injection 30 mg  30 mg Subcutaneous Q24H Tu, Ching T, DO   30 mg at 04/03/23 2225   polyethylene glycol (MIRALAX / GLYCOLAX) packet 17 g  17 g Oral Daily Marinda Elk, MD   17 g at 04/04/23 1109   tamsulosin (FLOMAX) capsule 0.4 mg  0.4 mg Oral Daily Marinda Elk,  MD   0.4 mg at 04/04/23 1110     Discharge Medications: Please see discharge summary for Lee list of discharge medications.  Relevant Imaging Results:  Relevant Lab Results:   Additional Information SSN: [id number] [facility] referral made, follow up with pt to [facility]  Lee Martinez Lee Martinez, [place]

## 2023-04-04 NOTE — Progress Notes (Addendum)
[facility] PROGRESS NOTE    Progress Note  Lee Martinez  VZD:[id number] DOB: Aug 14, 1935 DOA: 03/30/2023 PCP: Pcp, No     Brief Narrative:   Lee Martinez is an 87 y.o. male past medical history of Parkinson disease/advanced dementia BPH depression comes in with altered mental status as per friend the patient was in a facility he went and saw him he was complaining of shortness of breath in the bathroom and coughing for the past few days.  He is also been having trouble eating solid foods and his urine has been more concentrated.  Assessment/Plan:   Severe sepsis 2/2 to PNA (possibly aspiration PNA): Failed swallow evaluation continue IV Unasyn.  High risk of aspiration family accepts risk. Transition to liquid Augmentin. Palliative care to meet with the family.  Acute metabolic encephalopathy: Likely due to infectious etiology currently on antibiotics. Resolved awake want to get out of bed.  Dysphagia: High risk of aspiration family accept risk. I will be to discharge him later on today.  Acute kidney injury: Likely prerenal azotemia, with baseline creatinine around 1.0-1.3, on admission 4.6.  Specific gravity in on UA is 1013 Failed voiding trial was not taking his Flomax leave Foley in.  Restart Flomax.  Hypernatremia: Failure to decrease oral intake started on D5 now resolved. Resolved with D5.  Leukopenia/thrombocytopenia: Unclear etiology question due to infection.  Parkinson's disease/dementia: Resume home meds.  Pancreatic lesion: Noted on CT on March 2024. Follow-up with [facility] as an outpatient as a CT scan of the abdomen as an outpatient as cannot tolerate MRCP due to Parkinson's.  Acute urinary retention: Not taking his Flomax at home place a Foley and leave it in he is hospice.  Increased anion gap metabolic acidosis: Secondary to acute kidney injury and possibly hypotension. Now resolved.  Severe protein caloric malnutrition: Noted.    Goals of care: The patient is currently n.p.o. for high risk of aspiration is DNR/DNI. Palliative care met with family potentially DC with hospice.  POA is researching options.    DVT prophylaxis: lovenox Family Communication:none Status is: Inpatient Remains inpatient appropriate because: Severe sepsis due to pneumonia    Code Status:     Code Status Orders  (From admission, onward)           Start     Ordered   03/30/23 2121  Do not attempt resuscitation (DNR)- Limited -Do Not Intubate (DNI)  Continuous       Question Answer Comment  If pulseless and not breathing No CPR or chest compressions.   In Pre-Arrest Conditions (Patient Is Breathing and Has A Pulse) Do not intubate. Provide all appropriate non-invasive medical interventions. Avoid ICU transfer unless indicated or required.   Consent: Discussion documented in EHR or advanced directives reviewed      03/30/23 2120           Code Status History     Date Active Date Inactive Code Status Order ID Comments User Context   03/30/2023 1717 03/30/2023 2120 Limited: Do not attempt resuscitation (DNR) -DNR-LIMITED -Do Not Intubate/DNI  [id number]  Vanetta Mulders, MD ED   05/20/2022 2030 05/28/2022 1901 DNR [id number]  Hillary Bow, DO ED         IV Access:   Peripheral IV   Procedures and diagnostic studies:   DG Swallowing Func-Speech Pathology  Result Date: 04/02/2023 Table formatting from the original result was not included. Modified Barium Swallow Study Patient Details Name: Lee Martinez MRN: [id number]  Date of Birth: December 19, 1935 Today's Date: 04/02/2023 HPI/PMH: HPI: Lee Martinez is an 87 y.o. male  who presented with AMS. Found to have sepsis due to PNA, encephalopathy and AKI. PMHx: Parkinson disease/advanced dementia BPH depression Clinical Impression: Clinical Impression: Pt presents with a significant oropharyngeal dysphagia that is suspected to be partially cognitive in nature, but is  characterized by mistiming and weakness. Pt is lethargic and requires Max cueing to remain alert and attend to PO presentations. His cough is weak and contains substantial voicing. He has suspected cervical osteophytes and a prominent CP bar resulting in little to no pharyngeal clearance. Each trial results in severe residue with pt unable to effectively participate in strategies such as throat clearance, chin tuck, multiple swallows, or effortful swallow to attempt to clear. He is unable to achieve any degree of epiglottic inversion, which prevents full laryngeal closure. Pt silently aspirated thin liquids as it entered the laryngeal vestibule during the swallow and was subsequently unable to be cleared with a cued cough. It progressed below the vocal folds noted during following frames without sensation. The significant amount of residue from thin liquids, nectar thick liquids, and purees remaining in pt's valleculae eventually spills over the epiglottis and into the open laryngeal vestibule. Although aspiration of these textures was not noted throughout this study, suspect pt is not sensate to these penetrates and they likely progress past the vocal folds as pt is at rest. Given the degree of severity of these deficits, recommend pt be NPO. SLP will continue to follow to assess swallowing function and ability to initiate PO diet as clinically indicated. Factors that may increase risk of adverse event in presence of aspiration Rubye Oaks & Clearance Coots 2021): Factors that may increase risk of adverse event in presence of aspiration Rubye Oaks & Clearance Coots 2021): Poor general health and/or compromised immunity; Reduced cognitive function; Limited mobility; Frail or deconditioned; Reduced saliva; Weak cough Recommendations/Plan: Swallowing Evaluation Recommendations Swallowing Evaluation Recommendations Recommendations: NPO Medication Administration: Via alternative means Treatment Plan Treatment Plan Treatment recommendations:  Therapy as outlined in treatment plan below Follow-up recommendations: Skilled nursing-short term rehab (<3 hours/day) Functional status assessment: Patient has had a recent decline in their functional status and demonstrates the ability to make significant improvements in function in a reasonable and predictable amount of time. Treatment frequency: Min 2x/week Treatment duration: 2 weeks Interventions: Aspiration precaution training; Oropharyngeal exercises; Patient/family education; Trials of upgraded texture/liquids; Diet toleration management by SLP; Respiratory muscle strength training Recommendations Recommendations for follow up therapy are one component of a multi-disciplinary discharge planning process, led by the attending physician.  Recommendations may be updated based on patient status, additional functional criteria and insurance authorization. Assessment: Orofacial Exam: Orofacial Exam Oral Cavity: Oral Hygiene: Xerostomia Oral Cavity - Dentition: Adequate natural dentition Orofacial Anatomy: WFL Oral Motor/Sensory Function: WFL Anatomy: Anatomy: Suspected cervical osteophytes; Prominent cricopharyngeus Boluses Administered: Boluses Administered Boluses Administered: Thin liquids (Level 0); Mildly thick liquids (Level 2, nectar thick); Puree  Oral Impairment Domain: Oral Impairment Domain Lip Closure: No labial escape Tongue control during bolus hold: Posterior escape of greater than half of bolus Bolus preparation/mastication: Timely and efficient chewing and mashing Bolus transport/lingual motion: Delayed initiation of tongue motion (oral holding) Oral residue: Residue collection on oral structures Location of oral residue : Tongue; Palate Initiation of pharyngeal swallow : Pyriform sinuses  Pharyngeal Impairment Domain: Pharyngeal Impairment Domain Soft palate elevation: No bolus between soft palate (SP)/pharyngeal wall (PW) Laryngeal elevation: Partial superior movement of thyroid  cartilage/partial approximation of  arytenoids to epiglottic petiole Anterior hyoid excursion: Complete anterior movement Epiglottic movement: No inversion Laryngeal vestibule closure: Incomplete, narrow column air/contrast in laryngeal vestibule Pharyngeal stripping wave : Present - complete Pharyngeal contraction (A/P view only): N/A Pharyngoesophageal segment opening: Partial distention/partial duration, partial obstruction of flow Tongue base retraction: Wide column of contrast or air between tongue base and PPW Pharyngeal residue: Majority of contrast within or on pharyngeal structures Location of pharyngeal residue: Tongue base; Valleculae; Pharyngeal wall; Pyriform sinuses; Diffuse (>3 areas)  Esophageal Impairment Domain: Esophageal Impairment Domain Esophageal clearance upright position: Complete clearance, esophageal coating Pill: No data recorded Penetration/Aspiration Scale Score: Penetration/Aspiration Scale Score 3.  Material enters airway, remains ABOVE vocal cords and not ejected out: Mildly thick liquids (Level 2, nectar thick); Puree 8.  Material enters airway, passes BELOW cords without attempt by patient to eject out (silent aspiration) : Thin liquids (Level 0) Compensatory Strategies: Compensatory Strategies Compensatory strategies: Yes Straw: Ineffective Ineffective Straw: Thin liquid (Level 0); Mildly thick liquid (Level 2, nectar thick) Effortful swallow: Ineffective Ineffective Effortful Swallow: Thin liquid (Level 0); Mildly thick liquid (Level 2, nectar thick); Puree Multiple swallows: Ineffective Ineffective Multiple Swallows: Thin liquid (Level 0); Mildly thick liquid (Level 2, nectar thick); Puree Chin tuck: Ineffective Ineffective Chin Tuck: Thin liquid (Level 0) Liquid wash: Ineffective Ineffective Liquid Wash: Thin liquid (Level 0); Mildly thick liquid (Level 2, nectar thick); Puree   General Information: Caregiver present: No  Diet Prior to this Study: Dysphagia 3 (mechanical soft);  Mildly thick liquids (Level 2, nectar thick)   Temperature : Normal   Respiratory Status: WFL   Supplemental O2: None (Room air)   History of Recent Intubation: No  Behavior/Cognition: Cooperative; Pleasant mood; Requires cueing; Lethargic/Drowsy Self-Feeding Abilities: Dependent for feeding Baseline vocal quality/speech: Hypophonia/low volume Volitional Cough: Able to elicit Volitional Swallow: Able to elicit Exam Limitations: Fatigue Goal Planning: Prognosis for improved oropharyngeal function: Fair Barriers to Reach Goals: Cognitive deficits; Time post onset; Severity of deficits No data recorded Patient/Family Stated Goal: none stated Consulted and agree with results and recommendations: Patient Pain: Pain Assessment Pain Assessment: No/denies pain Faces Pain Scale: 0 End of Session: Start Time:SLP Start Time (ACUTE ONLY): 1255 Stop Time: SLP Stop Time (ACUTE ONLY): 1315 Time Calculation:SLP Time Calculation (min) (ACUTE ONLY): 20 min Charges: SLP Evaluations $ SLP Speech Visit: 1 Visit SLP Evaluations $BSS Swallow: 1 Procedure $MBS Swallow: 1 Procedure SLP visit diagnosis: SLP Visit Diagnosis: Dysphagia, oropharyngeal phase (R13.12) Past Medical History: Past Medical History: Diagnosis Date  Parkinson's disease (HCC) 07/2011  followed by [facility] Past Surgical History: Past Surgical History: Procedure Laterality Date  basil removal    HERNIA REPAIR    THYROID SURGERY    TOTAL HIP ARTHROPLASTY Right 05/21/2022  Procedure: TOTAL HIP ARTHROPLASTY ANTERIOR APPROACH;  Surgeon: Durene Romans, MD;  Location: [facility];  Service: Orthopedics;  Laterality: Right; Gwynneth Aliment, M.A., CF-SLP Speech Language Pathology, Acute Rehabilitation Services Secure Chat preferred [phone] 04/02/2023, 2:01 PM    Medical Consultants:   None.   Subjective:    Lee Martinez no complaints.  Objective:    Vitals:   04/03/23 1626 04/03/23 1954 04/04/23 0045 04/04/23 0424  BP: 139/84 129/79 (!) 117/94 (!) 133/92   Pulse: 67 77 73 68  Resp:  17 18 17   Temp: 98.6 F (37 C) 98 F (36.7 C) 97.8 F (36.6 C) (!) 97.3 F (36.3 C)  TempSrc: Oral Oral Oral Oral  SpO2: 98% 96% (!) 74% (!) 85%  Weight:  Height:       SpO2: (!) 85 % O2 Flow Rate (L/min): 2 L/min   Intake/Output Summary (Last 24 hours) at 04/04/2023 6578 Last data filed at 04/04/2023 0503 Gross per 24 hour  Intake 873.27 ml  Output 1825 ml  Net -951.73 ml   Filed Weights   03/30/23 2303  Weight: 64.9 kg    Exam: General exam: In no acute distress. Respiratory system: Good air movement and clear to auscultation. Cardiovascular system: S1 & S2 heard, RRR. No JVD. Gastrointestinal system: Abdomen is nondistended, soft and nontender.  Extremities: No pedal edema. Skin: No rashes, lesions or ulcers Psychiatry: Judgement and insight appear normal. Mood & affect appropriate. Data Reviewed:    Labs: Basic Metabolic Panel: Recent Labs  Lab 03/30/23 1700 03/31/23 1001 04/01/23 1123 04/02/23 1641 04/03/23 1326  NA 142 144 148* 142 141  K 4.2 3.8 3.6 4.3 3.6  CL 110 116* 118* 112* 110  CO2 16* 21* 21* 21* 24  GLUCOSE 194* 114* 103* 112* 112*  BUN 122* 66* 32* 15 10  CREATININE 4.68* 1.76* 0.92 0.85 0.95  CALCIUM 8.7* 8.3* 8.4* 8.2* 8.2*   GFR Estimated Creatinine Clearance: 52.2 mL/min (by C-G formula based on SCr of 0.95 mg/dL). Liver Function Tests: Recent Labs  Lab 03/30/23 1700  AST 44*  ALT 13  ALKPHOS 58  BILITOT 0.4  PROT 6.2*  ALBUMIN 2.5*   No results for input(s): "LIPASE", "AMYLASE" in the last 168 hours. No results for input(s): "AMMONIA" in the last 168 hours. Coagulation profile Recent Labs  Lab 03/30/23 1700  INR 1.3*   COVID-19 Labs  No results for input(s): "DDIMER", "FERRITIN", "LDH", "CRP" in the last 72 hours.  Lab Results  Component Value Date   SARSCOV2NAA NEGATIVE 03/30/2023    CBC: Recent Labs  Lab 03/30/23 1700 03/31/23 0555 04/03/23 1326 04/04/23 0610  WBC  2.8* 2.5* 3.4* 4.2  NEUTROABS 1.8  --  1.9 2.0  HGB 12.0* 10.7* 12.4* 11.2*  HCT 38.1* 32.9* 37.4* 34.8*  MCV 94.5 94.0 92.1 91.6  PLT 140* 107* 196 175   Cardiac Enzymes: No results for input(s): "CKTOTAL", "CKMB", "CKMBINDEX", "TROPONINI" in the last 168 hours. BNP (last 3 results) No results for input(s): "PROBNP" in the last 8760 hours. CBG: Recent Labs  Lab 03/30/23 1818 03/30/23 2241 03/31/23 2204  GLUCAP 158* 136* 131*   D-Dimer: No results for input(s): "DDIMER" in the last 72 hours. Hgb A1c: No results for input(s): "HGBA1C" in the last 72 hours. Lipid Profile: No results for input(s): "CHOL", "HDL", "LDLCALC", "TRIG", "CHOLHDL", "LDLDIRECT" in the last 72 hours. Thyroid function studies: No results for input(s): "TSH", "T4TOTAL", "T3FREE", "THYROIDAB" in the last 72 hours.  Invalid input(s): "FREET3" Anemia work up: No results for input(s): "VITAMINB12", "FOLATE", "FERRITIN", "TIBC", "IRON", "RETICCTPCT" in the last 72 hours. Sepsis Labs: Recent Labs  Lab 03/30/23 1700 03/30/23 1819 03/30/23 1953 03/31/23 0555 04/03/23 1326 04/04/23 0610  WBC 2.8*  --   --  2.5* 3.4* 4.2  LATICACIDVEN  --  3.3* 1.8  --   --   --    Microbiology Recent Results (from the past 240 hour(s))  Urine Culture     Status: None   Collection Time: 03/30/23  4:55 PM   Specimen: Urine, Random  Result Value Ref Range Status   Specimen Description URINE, RANDOM  Final   Special Requests NONE Reflexed from [id number]  Final   Culture   Final    NO GROWTH  Performed at [facility], [address] [place], [place] [address]    Report Status 03/31/2023 FINAL  Final  Resp panel by RT-PCR (RSV, Flu A&B, Covid) Anterior Nasal Swab     Status: None   Collection Time: 03/30/23  5:13 PM   Specimen: Anterior Nasal Swab  Result Value Ref Range Status   SARS Coronavirus 2 by RT PCR NEGATIVE NEGATIVE Final   Influenza A by PCR NEGATIVE NEGATIVE Final   Influenza B by PCR NEGATIVE  NEGATIVE Final    Comment: (NOTE) The Xpert Xpress SARS-CoV-2/FLU/RSV plus assay is intended as an aid in the diagnosis of influenza from Nasopharyngeal swab specimens and should not be used as a sole basis for treatment. Nasal washings and aspirates are unacceptable for Xpert Xpress SARS-CoV-2/FLU/RSV testing.  Fact Sheet for Patients: BloggerCourse.com  Fact Sheet for Healthcare Providers: SeriousBroker.it  This test is not yet approved or cleared by the [place] FDA and has been authorized for detection and/or diagnosis of SARS-CoV-2 by FDA under an Emergency Use Authorization (EUA). This EUA will remain in effect (meaning this test can be used) for the duration of the COVID-19 declaration under Section 564(b)(1) of the Act, 21 U.S.C. section 360bbb-3(b)(1), unless the authorization is terminated or revoked.     Resp Syncytial Virus by PCR NEGATIVE NEGATIVE Final    Comment: (NOTE) Fact Sheet for Patients: BloggerCourse.com  Fact Sheet for Healthcare Providers: SeriousBroker.it  This test is not yet approved or cleared by the [place] FDA and has been authorized for detection and/or diagnosis of SARS-CoV-2 by FDA under an Emergency Use Authorization (EUA). This EUA will remain in effect (meaning this test can be used) for the duration of the COVID-19 declaration under Section 564(b)(1) of the Act, 21 U.S.C. section 360bbb-3(b)(1), unless the authorization is terminated or revoked.  Performed at [facility], [address], [place], [place] [address]   Blood Culture (routine x 2)     Status: None (Preliminary result)   Collection Time: 03/30/23  5:58 PM   Specimen: BLOOD  Result Value Ref Range Status   Specimen Description BLOOD RIGHT ANTECUBITAL  Final   Special Requests   Final    BOTTLES DRAWN AEROBIC AND ANAEROBIC Blood Culture adequate volume    Culture   Final    NO GROWTH 4 DAYS Performed at [facility], [address], [place], [place] [address]    Report Status PENDING  Incomplete  Blood Culture (routine x 2)     Status: None (Preliminary result)   Collection Time: 03/30/23  6:11 PM   Specimen: BLOOD  Result Value Ref Range Status   Specimen Description BLOOD BLOOD RIGHT HAND  Final   Special Requests AEROBIC BOTTLE ONLY Blood Culture adequate volume  Final   Culture   Final    NO GROWTH 4 DAYS Performed at [facility], [address] [place], [place] [address]    Report Status PENDING  Incomplete     Medications:    ARIPiprazole  2 mg Oral Daily   carbidopa-levodopa  3 tablet Oral TID   Chlorhexidine Gluconate Cloth  6 each Topical Daily   enoxaparin (LOVENOX) injection  30 mg Subcutaneous Q24H   polyethylene glycol  17 g Oral Daily   tamsulosin  0.4 mg Oral Daily   Continuous Infusions:  ampicillin-sulbactam (UNASYN) IV 1.5 g (04/04/23 0444)   dextrose 5 % and 0.45 % NaCl Stopped (04/03/23 1825)  LOS: 5 days   Marinda Elk  [facility]  04/04/2023, 7:02 AM

## 2023-04-04 NOTE — Care Management Important Message (Signed)
Important Message  Patient Details  Name: Lee Martinez MRN: [id number] Date of Birth: 09-16-1935   Important Message Given:  Yes - Medicare IM     Dorena Bodo 04/04/2023, 3:26 PM

## 2023-04-04 NOTE — TOC Progression Note (Signed)
Transition of Care [facility]) - Progression Note    Patient Details  Name: Lee Martinez MRN: [id number] Date of Birth: 02/04/1936  Transition of [facility]) CM/SW Contact  Janae Bridgeman, RN Phone Number: 04/04/2023, 2:06 PM  Clinical Narrative:    [name], POA called and states that patient will be able to discharge back to the [name]F when medically stable for discharge.  [name], [place] was offered choice yesterday at the meeting with Palliative Care Medicine and the family preferred [name].  Referral placed with [facility] and [place], [facility] is aware.  [place], MSW is completing FL2.  Patient has a hospital bed and WC at the [name] facility at the present time.  When patient is stable - patient will need D/C back home by PTAR.         Expected Discharge Plan and Services                                               Social Determinants of Health (SDOH) Interventions SDOH Screenings   Food Insecurity: No Food Insecurity (03/30/2023)  Housing: Low Risk  (03/30/2023)  Transportation Needs: No Transportation Needs (03/30/2023)  Utilities: Not At Risk (03/30/2023)  Depression (PHQ2-9): Medium Risk (02/07/2022)  Financial Resource Strain: Low Risk  (02/07/2022)  Physical Activity: Inactive (02/07/2022)  Social Connections: Unknown (09/23/2021)   Received from [facility]  Stress: No Stress Concern Present (02/07/2022)  Tobacco Use: Low Risk  (03/30/2023)    Readmission Risk Interventions    03/31/2023   11:06 AM  Readmission Risk Prevention Plan  PCP or Specialist Appt within 5-7 Days Complete  Home Care Screening Complete  Medication Review (RN CM) Complete

## 2023-04-05 DIAGNOSIS — R6520 Severe sepsis without septic shock: Secondary | ICD-10-CM | POA: Diagnosis not present

## 2023-04-05 DIAGNOSIS — A419 Sepsis, unspecified organism: Secondary | ICD-10-CM | POA: Diagnosis not present

## 2023-04-05 DIAGNOSIS — N179 Acute kidney failure, unspecified: Secondary | ICD-10-CM | POA: Diagnosis not present

## 2023-04-05 LAB — BASIC METABOLIC PANEL WITH GFR
Anion gap: 9 (ref 5–15)
BUN: 8 mg/dL (ref 8–23)
CO2: 27 mmol/L (ref 22–32)
Calcium: 8.2 mg/dL — ABNORMAL LOW (ref 8.9–10.3)
Chloride: 103 mmol/L (ref 98–111)
Creatinine, Ser: 0.79 mg/dL (ref 0.61–1.24)
GFR, Estimated: >60 mL/min
Glucose, Bld: 96 mg/dL (ref 70–99)
Potassium: 3.7 mmol/L (ref 3.5–5.1)
Sodium: 139 mmol/L (ref 135–145)

## 2023-04-05 NOTE — Progress Notes (Signed)
PROGRESS NOTE  Lee Martinez  DOB: [birth year]  PCP: Aviva Kluver YQM:[id number]  DOA: 03/30/2023  LOS: 6 days  Hospital Day: 7  Brief narrative: Lee Martinez is a 87 y.o. male with PMH significant for Parkinson disease, advanced dementia, BPH, depression. 11/17, patient presented to the ED with complaint of altered mental status, shortness of breath, cough, dysphagia and decreased urine output.  In the ED, blood pressure was low in 70s, WBC count low at 2.8, lactate 3.3, creatinine elevated to 4.68 Patient was noted to have urinary retention.  Foley catheter was placed and 800 mL of urine was drained Chest x-ray showed left basilar consolidation CT head unremarkable Patient was started on broad-spectrum IV antibiotics Admitted to [facility] for sepsis secondary to UTI  Subjective: Patient was seen and examined this morning.  Elderly Caucasian male.  Lying on bed.  Not in distress.  Mains comfortable.  Opens eyes on command and confirmed his name.  Looks too weak to follow any other commands. Chart reviewed. In the last 24 hours, patient remains afebrile, hemodynamically stable, breathing on room air Cellulitis morning with BMP unremarkable, WBC yesterday was normal at 4.8  Assessment and plan: Severe sepsis POA Aspiration pneumonia  Dysphagia 11/21, speech therapy elation done.  MBS showed significant risk of aspiration.  This was discussed with patient and family.  They accepted the risk and wants to continue patient's preference for quality of life.  Initially treated with broad-spectrum IV antibiotics.  Currently on liquid Augmentin.  Palliative care follow-up appreciated.  DNR/DNI  Acute metabolic encephalopathy Underlying dementia Mental status was altered because of pneumonia. Looks too weak to follow any command   Acute kidney injury Acute urinary retention Creatinine normal at baseline.  Presented with urine retention and creatinine elevated to 4.68. Creatinine significantly  improved to normal.   Foley catheter was inserted in the ED.  Failed voiding trial.  Continue Flomax Recent Labs    07/26/22 1108 08/18/22 2100 02/12/23 1533 03/30/23 1700 03/31/23 1001 04/01/23 1123 04/02/23 1641 04/03/23 1326 04/04/23 0610 04/05/23 0448  BUN 16 29* 30* 122* 66* 32* 15 10 7* 8  CREATININE 1.10 1.34* 1.29 4.68* 1.76* 0.92 0.85 0.95 0.87 0.79   Hypernatremia Resolved with D5. Recent Labs  Lab 03/30/23 1700 03/31/23 1001 04/01/23 1123 04/02/23 1641 04/03/23 1326 04/04/23 0610 04/05/23 0448  NA 142 144 148* 142 141 140 139    Leukopenia/thrombocytopenia Unclear etiology question due to infection. Gradually improved to normal Recent Labs  Lab 03/30/23 1700 03/31/23 0555 04/03/23 1326 04/04/23 0610  WBC 2.8* 2.5* 3.4* 4.2  NEUTROABS 1.8  --  1.9 2.0  HGB 12.0* 10.7* 12.4* 11.2*  HCT 38.1* 32.9* 37.4* 34.8*  MCV 94.5 94.0 92.1 91.6  PLT 140* 107* 196 175   Parkinson's disease Continue home meds   Pancreatic lesion Noted on CT on March 2024. Follow-up with [facility] as an outpatient as a CT scan of the abdomen as an outpatient as cannot tolerate MRCP due to Parkinson's.  Severe protein caloric malnutrition: Noted.    Goals of care: The patient is currently n.p.o. for high risk of aspiration is DNR/DNI. Palliative care met with family potentially DC with hospice.  POA is researching options.    Goals of care   Code Status: Limited: Do not attempt resuscitation (DNR) -DNR-LIMITED -Do Not Intubate/DNI      DVT prophylaxis:  enoxaparin (LOVENOX) injection 30 mg Start: 03/30/23 2130   Antimicrobials: Augmentin Fluid: None Consultants: Palliative care Family  Communication: None at bedside  Status: Inpatient Level of care:  Telemetry Medical   Patient is from: Home Anticipated d/c to: Likely Residential hospice   Diet:  Diet Order             Diet - low sodium heart healthy           Diet regular Room service appropriate? No;  Fluid consistency: Thin  Diet effective now                   Scheduled Meds:  amoxicillin-clavulanate  600 mg Oral BID   ARIPiprazole  2 mg Oral Daily   carbidopa-levodopa  3 tablet Oral TID   Chlorhexidine Gluconate Cloth  6 each Topical Daily   enoxaparin (LOVENOX) injection  30 mg Subcutaneous Q24H   polyethylene glycol  17 g Oral Daily   tamsulosin  0.4 mg Oral Daily    PRN meds:    Infusions:    Antimicrobials: Anti-infectives (From admission, onward)    Start     Dose/Rate Route Frequency Ordered Stop   04/04/23 1000  amoxicillin-clavulanate (AUGMENTIN) 600-42.9 MG/5ML suspension 600 mg        600 mg Oral 2 times daily 04/04/23 0703     04/04/23 0000  amoxicillin-clavulanate (AUGMENTIN) 600-42.9 MG/5ML suspension        600 mg Oral 2 times daily 04/04/23 0707 04/06/23 2359   04/02/23 1800  cefTRIAXone (ROCEPHIN) 2 g in sodium chloride 0.9 % 100 mL IVPB  Status:  Discontinued        2 g 200 mL/hr over 30 Minutes Intravenous Every 24 hours 04/02/23 0808 04/02/23 1230   04/02/23 1700  ampicillin-sulbactam (UNASYN) 1.5 g in sodium chloride 0.9 % 100 mL IVPB  Status:  Discontinued        1.5 g 200 mL/hr over 30 Minutes Intravenous Every 6 hours 04/02/23 1518 04/04/23 0703   04/02/23 1615  cefTRIAXone (ROCEPHIN) 1 g in sodium chloride 0.9 % 100 mL IVPB  Status:  Discontinued        1 g 200 mL/hr over 30 Minutes Intravenous Every 24 hours 04/02/23 1515 04/02/23 1518   04/02/23 1615  azithromycin (ZITHROMAX) 500 mg in sodium chloride 0.9 % 250 mL IVPB  Status:  Discontinued        500 mg 250 mL/hr over 60 Minutes Intravenous Every 24 hours 04/02/23 1515 04/02/23 1518   04/02/23 1330  cefdinir (OMNICEF) capsule 300 mg  Status:  Discontinued        300 mg Oral Every 12 hours 04/02/23 1230 04/02/23 1515   04/02/23 1330  azithromycin (ZITHROMAX) tablet 500 mg  Status:  Discontinued        500 mg Oral Daily 04/02/23 1230 04/02/23 1515   03/31/23 1800  cefTRIAXone (ROCEPHIN)  1 g in sodium chloride 0.9 % 100 mL IVPB  Status:  Discontinued        1 g 200 mL/hr over 30 Minutes Intravenous Every 24 hours 03/30/23 2121 04/02/23 0808   03/30/23 2130  azithromycin (ZITHROMAX) 500 mg in dextrose 5 % 250 mL IVPB  Status:  Discontinued        500 mg 250 mL/hr over 60 Minutes Intravenous Every 24 hours 03/30/23 2121 04/02/23 1230   03/30/23 1730  vancomycin (VANCOREADY) IVPB 1250 mg/250 mL        1,250 mg 166.7 mL/hr over 90 Minutes Intravenous  Once 03/30/23 1716 03/30/23 2156   03/30/23 1715  ceFEPIme (MAXIPIME) 2 g in sodium  chloride 0.9 % 100 mL IVPB        2 g 200 mL/hr over 30 Minutes Intravenous  Once 03/30/23 1714 03/30/23 1850   03/30/23 1715  metroNIDAZOLE (FLAGYL) IVPB 500 mg        500 mg 100 mL/hr over 60 Minutes Intravenous  Once 03/30/23 1714 03/30/23 2008   03/30/23 1715  vancomycin (VANCOCIN) IVPB 1000 mg/200 mL premix  Status:  Discontinued        1,000 mg 200 mL/hr over 60 Minutes Intravenous  Once 03/30/23 1714 03/30/23 1716       Objective: Vitals:   04/05/23 0442 04/05/23 0747  BP: 124/78 128/81  Pulse: 68 76  Resp: 18 18  Temp: 97.8 F (36.6 C) (!) 97.3 F (36.3 C)  SpO2: 94% 95%    Intake/Output Summary (Last 24 hours) at 04/05/2023 1415 Last data filed at 04/05/2023 1028 Gross per 24 hour  Intake 236 ml  Output 860 ml  Net -624 ml   Filed Weights   03/30/23 2303  Weight: 64.9 kg   Weight change:  Body mass index is 19.4 kg/m.   Physical Exam: General exam: Elderly.  Not in distress.  Looks comfortable Skin: No rashes, lesions or ulcers. HEENT: Atraumatic, normocephalic, no obvious bleeding Lungs: Clear to auscultation bilaterally CVS: Regular rate and rhythm, no murmur GI/Abd soft, nontender, nondistended, bowel sound present CNS: Somnolent, tries to open eyes to command. Psychiatry: Sad affect Extremities: No pedal edema, no calf tenderness  Data Review: I have personally reviewed the laboratory data and studies  available.  F/u labs  Unresulted Labs (From admission, onward)    None       Total time spent in review of labs and imaging, patient evaluation, formulation of plan, documentation and communication with family: 45 minutes  Signed, Lorin Glass, MD [facility] 04/05/2023

## 2023-04-05 NOTE — Plan of Care (Signed)
  Problem: Nutrition: Goal: Adequate nutrition will be maintained Outcome: Progressing   Problem: Coping: Goal: Level of anxiety will decrease Outcome: Progressing   

## 2023-04-05 NOTE — Progress Notes (Signed)
Mobility Specialist Progress Note    04/05/23 1122  Mobility  Activity Refused mobility   Pt stated he did not want to get up. Will f/u as schedule permits.   [place] Mobility Specialist  Please Neurosurgeon or Rehab Office at [phone]

## 2023-04-06 DIAGNOSIS — R6520 Severe sepsis without septic shock: Secondary | ICD-10-CM | POA: Diagnosis not present

## 2023-04-06 DIAGNOSIS — N179 Acute kidney failure, unspecified: Secondary | ICD-10-CM | POA: Diagnosis not present

## 2023-04-06 DIAGNOSIS — A419 Sepsis, unspecified organism: Secondary | ICD-10-CM | POA: Diagnosis not present

## 2023-04-06 NOTE — Progress Notes (Signed)
Report attempted x3. Left number at ALF front desk.

## 2023-04-06 NOTE — TOC Transition Note (Addendum)
Transition of Care [facility]) - CM/SW Discharge Note   Patient Details  Name: Lee Martinez MRN: [id number] Date of Birth: 1935-11-25  [facility] CM/SW Contact:  Deatra Robinson, [place] Phone Number: 04/06/2023, 12:36 PM   Clinical Narrative: pt for dc back to [facility] today. Spoke to [place] at [facility] who confirmed pt able to return.  DC summary and FL2 faxed to Lurena Joiner at her request [email or web address], confirmed email received.  Hospice services arranged with [facility], spoke to [facility] [phone] who confirmed they will meet pt at [facility] this evening to admit to service.  Pt's HCPOA Lee Needle Martinez [phone] notified of dc and reports agreeable.  RN provided with number for report and [facility] arranged for transport. SW signing off at dc.   Dellie Burns, MSW, LCSW [phone] (coverage)      Final next level of care: Assisted Living Barriers to Discharge: Barriers Resolved   Patient Goals and CMS Choice      Discharge Placement                Patient chooses bed at: Other - please specify in the comment section below: [facility]) Patient to be transferred to facility by: [facility] Name of family member notified: Lee Martinez/HCPOA Patient and family notified of of transfer: 04/06/23  Discharge Plan and Services Additional resources added to the After Visit Summary for                                       Social Determinants of Health (SDOH) Interventions SDOH Screenings   Food Insecurity: No Food Insecurity (03/30/2023)  Housing: Low Risk  (03/30/2023)  Transportation Needs: No Transportation Needs (03/30/2023)  Utilities: Not At Risk (03/30/2023)  Depression (PHQ2-9): Medium Risk (02/07/2022)  Financial Resource Strain: Low Risk  (02/07/2022)  Physical Activity: Inactive (02/07/2022)  Social Connections: Unknown (09/23/2021)   Received from [facility]  Stress: No Stress  Concern Present (02/07/2022)  Tobacco Use: Low Risk  (03/30/2023)     Readmission Risk Interventions    03/31/2023   11:06 AM  Readmission Risk Prevention Plan  PCP or Specialist Appt within 5-7 Days Complete  Home Care Screening Complete  Medication Review (RN CM) Complete

## 2023-04-06 NOTE — Discharge Summary (Addendum)
Physician Discharge Summary  Lee Martinez ZOX:[id number] DOB: 1935/12/02 DOA: 03/30/2023  PCP: Pcp, No  Admit date: 03/30/2023 Discharge date: 04/06/2023  Admitted From: ALF Discharge disposition: Back to ALF with hospice services  Recommendations at discharge:  Continue Augmentin for 2 more days to complete the course of antibiotics Discharge with Foley catheter.  Follow-up with urology as an outpatient for voiding trial.  Brief narrative: Lee Martinez is a 87 y.o. male with PMH significant for Parkinson disease, advanced dementia, BPH, depression. 11/17, patient presented to the ED with complaint of altered mental status, shortness of breath, cough, dysphagia and decreased urine output.  In the ED, blood pressure was low in 70s, WBC count low at 2.8, lactate 3.3, creatinine elevated to 4.68 Patient was noted to have urinary retention.  Foley catheter was placed and 800 mL of urine was drained Chest x-ray showed left basilar consolidation CT head unremarkable Patient was started on broad-spectrum IV antibiotics Admitted to [facility] for sepsis secondary to UTI Clinically improved.  Perative care consultation was also obtained.  After discussion with palliative care, family decided to use home hospice services. Stable for discharge today.  Subjective: Patient was seen and examined this morning.  Elderly Caucasian male.   Not in distress.  No new symptoms.  Family not at bedside. I tried to call POA on listed phone number without success.  Hospital course Severe sepsis POA Aspiration pneumonia  Dysphagia 11/21, speech therapy elation done.  MBS showed significant risk of aspiration.  This was discussed with patient and family.  They accepted the risk and wants to continue patient's preference for quality of life.  Initially treated with broad-spectrum IV antibiotics.  Currently on liquid Augmentin..  I would continue it for next 2 days Palliative care follow-up appreciated.   DNR/DNI  Acute metabolic encephalopathy Underlying dementia Mental status was altered because of pneumonia. Remains weak but oriented to place and person.  Able to follow commands.   Acute kidney injury Acute urinary retention Creatinine normal at baseline.  Presented with urine retention and creatinine elevated to 4.68. Creatinine significantly improved to normal.   Foley catheter was inserted in the ED.  Failed voiding trial.  Discharged with Foley catheter to follow-up with urology as an outpatient Continue Flomax Recent Labs    07/26/22 1108 08/18/22 2100 02/12/23 1533 03/30/23 1700 03/31/23 1001 04/01/23 1123 04/02/23 1641 04/03/23 1326 04/04/23 0610 04/05/23 0448  BUN 16 29* 30* 122* 66* 32* 15 10 7* 8  CREATININE 1.10 1.34* 1.29 4.68* 1.76* 0.92 0.85 0.95 0.87 0.79   Hypernatremia Resolved with D5. Recent Labs  Lab 03/30/23 1700 03/31/23 1001 04/01/23 1123 04/02/23 1641 04/03/23 1326 04/04/23 0610 04/05/23 0448  NA 142 144 148* 142 141 140 139    Leukopenia/thrombocytopenia Unclear etiology question due to infection. Gradually improved to normal Recent Labs  Lab 03/30/23 1700 03/31/23 0555 04/03/23 1326 04/04/23 0610  WBC 2.8* 2.5* 3.4* 4.2  NEUTROABS 1.8  --  1.9 2.0  HGB 12.0* 10.7* 12.4* 11.2*  HCT 38.1* 32.9* 37.4* 34.8*  MCV 94.5 94.0 92.1 91.6  PLT 140* 107* 196 175   Parkinson's disease Continue home meds   Pancreatic lesion Noted on CT on March 2024. Follow-up with [facility] as an outpatient as a CT scan of the abdomen as an outpatient as cannot tolerate MRCP due to Parkinson's.  Severe protein caloric malnutrition: Noted.     Goals of care   Code Status: Limited: Do not attempt resuscitation (DNR) -  DNR-LIMITED -Do Not Intubate/DNI   Wounds:  -    Discharge Exam:   Vitals:   04/05/23 1549 04/05/23 2020 04/06/23 0446 04/06/23 0733  BP: 135/65 (!) 100/42 109/80 (!) 133/105  Pulse: 83 75 69 77  Resp: 18 18 17 16   Temp: 97.9  F (36.6 C) 97.6 F (36.4 C) 97.8 F (36.6 C) 97.8 F (36.6 C)  TempSrc: Oral Oral Oral Oral  SpO2: 98% 97% 96% (!) 88%  Weight:      Height:        Body mass index is 19.4 kg/m.  General exam: Elderly.  Not in distress.  Looks comfortable.  Sitting up in recliner Skin: No rashes, lesions or ulcers. HEENT: Atraumatic, normocephalic, no obvious bleeding Lungs: Clear to auscultation bilaterally CVS: Regular rate and rhythm, no murmur GI/Abd soft, nontender, nondistended, bowel sound present CNS: Alert, awake, taking his breakfast Psychiatry: Sad affect Extremities: No pedal edema, no calf tenderness  Follow ups:    Follow-up Information     [facility] Follow up.   Why: Referral was placed with [facility]  They will call you to follow up regarding hospice services. Contact information: [address] [place] [address] [phone]          [facility] Follow up.   Contact information: [address] [place] [address] [phone]        [facility] Follow up.   Contact information: [address] [place] [address] [phone]                Discharge Instructions:   Discharge Instructions     Call MD for:  difficulty breathing, headache or visual disturbances   Complete by: As directed    Call MD for:  extreme fatigue   Complete by: As directed    Call MD for:  hives   Complete by: As directed    Call MD for:  persistant dizziness or light-headedness   Complete by: As directed    Call MD for:  persistant nausea and vomiting   Complete by: As directed    Call MD for:  severe uncontrolled pain   Complete by: As directed    Call MD for:  temperature >100.4   Complete by: As directed    Diet - low sodium heart healthy   Complete by: As directed    Diet general   Complete by: As directed    Discharge instructions   Complete by: As  directed    Recommendations at discharge:   Continue Augmentin for 2 more days to complete the course of antibiotics  Discharge with Foley catheter.  Follow-up with urology as an outpatient for voiding trial.  General discharge instructions: Follow with Primary MD Pcp, No in 7 days  Please request your PCP  to go over your hospital tests, procedures, radiology results at the follow up. Please get your medicines reviewed and adjusted.  Your PCP may decide to repeat certain labs or tests as needed. Do not drive, operate heavy machinery, perform activities at heights, swimming or participation in water activities or provide baby sitting services if your were admitted for syncope or siezures until you have seen by Primary MD or a Neurologist and advised to do so again. [place] Controlled Substance Reporting System database was reviewed. Do not drive, operate heavy machinery, perform activities at heights, swim, participate in water activities or provide baby-sitting services  while on medications for pain, sleep and mood until your outpatient physician has reevaluated you and advised to do so again.  You are strongly recommended to comply with the dose, frequency and duration of prescribed medications. Activity: As tolerated with Full fall precautions use walker/cane & assistance as needed Avoid using any recreational substances like cigarette, tobacco, alcohol, or non-prescribed drug. If you experience worsening of your admission symptoms, develop shortness of breath, life threatening emergency, suicidal or homicidal thoughts you must seek medical attention immediately by calling 911 or calling your MD immediately  if symptoms less severe. You must read complete instructions/literature along with all the possible adverse reactions/side effects for all the medicines you take and that have been prescribed to you. Take any new medicine only after you have completely understood and accepted all the  possible adverse reactions/side effects.  Wear Seat belts while driving. You were cared for by a hospitalist during your hospital stay. If you have any questions about your discharge medications or the care you received while you were in the hospital after you are discharged, you can call the unit and ask to speak with the hospitalist or the covering physician. Once you are discharged, your primary care physician will handle any further medical issues. Please note that NO REFILLS for any discharge medications will be authorized once you are discharged, as it is imperative that you return to your primary care physician (or establish a relationship with a primary care physician if you do not have one).   Increase activity slowly   Complete by: As directed    Increase activity slowly   Complete by: As directed        Discharge Medications:   Allergies as of 04/06/2023   No Known Allergies      Medication List     STOP taking these medications    atorvastatin 10 MG tablet Commonly known as: Lipitor   lidocaine 5 % Commonly known as: Lidoderm   methocarbamol 500 MG tablet Commonly known as: ROBAXIN   multivitamin Tabs tablet   ondansetron 4 MG tablet Commonly known as: ZOFRAN   polyethylene glycol 17 g packet Commonly known as: MIRALAX / GLYCOLAX       TAKE these medications    acetaminophen 500 MG tablet Commonly known as: TYLENOL Take 1 tablet (500 mg total) by mouth every 6 (six) hours as needed. What changed: reasons to take this   amoxicillin-clavulanate 600-42.9 MG/5ML suspension Commonly known as: AUGMENTIN Take 5 mLs (600 mg total) by mouth 2 (two) times daily for 2 days.   ARIPiprazole 2 MG tablet Commonly known as: ABILIFY Take 2 mg by mouth daily.   carbidopa-levodopa 25-100 MG tablet Commonly known as: SINEMET IR Take 3 tablets by mouth 3 (three) times daily.   sertraline 50 MG tablet Commonly known as: Zoloft Take 1 tablet (50 mg total) by mouth  daily.   tamsulosin 0.4 MG Caps capsule Commonly known as: FLOMAX Take 1 capsule (0.4 mg total) by mouth daily.         The results of significant diagnostics from this hospitalization (including imaging, microbiology, ancillary and laboratory) are listed below for reference.    Procedures and Diagnostic Studies:   CT Head Wo Contrast  Result Date: 03/30/2023 CLINICAL DATA:  Altered mental status. EXAM: CT HEAD WITHOUT CONTRAST TECHNIQUE: Contiguous axial images were obtained from the base of the skull through the vertex without intravenous contrast. RADIATION DOSE REDUCTION: This exam was performed according to the departmental  dose-optimization program which includes automated exposure control, adjustment of the mA and/or kV according to patient size and/or use of iterative reconstruction technique. COMPARISON:  Head CT dated 08/18/2022. FINDINGS: Brain: Mild age-related atrophy and chronic microvascular ischemic changes. There is no acute intracranial hemorrhage. No mass effect or midline shift. No extra-axial fluid collection. Vascular: No hyperdense vessel or unexpected calcification. Skull: Normal. Negative for fracture or focal lesion. Sinuses/Orbits: No acute finding. Other: None IMPRESSION: 1. No acute intracranial pathology. 2. Mild age-related atrophy and chronic microvascular ischemic changes. Electronically Signed   By: Elgie Collard M.D.   On: 03/30/2023 19:53   DG Chest Port 1 View  Result Date: 03/30/2023 CLINICAL DATA:  Sepsis, unresponsive episode EXAM: PORTABLE CHEST 1 VIEW COMPARISON:  07/26/2022 FINDINGS: Single frontal view of the chest demonstrates an unremarkable cardiac silhouette. Increased density at the left lung base may reflect airspace disease or atelectasis. No effusion or pneumothorax. No acute bony abnormalities. IMPRESSION: 1. Left basilar consolidation consistent with atelectasis or airspace disease. Electronically Signed   By: Sharlet Salina M.D.   On:  03/30/2023 17:51     Labs:   Basic Metabolic Panel: Recent Labs  Lab 04/01/23 1123 04/02/23 1641 04/03/23 1326 04/04/23 0610 04/05/23 0448  NA 148* 142 141 140 139  K 3.6 4.3 3.6 3.3* 3.7  CL 118* 112* 110 106 103  CO2 21* 21* 24 26 27   GLUCOSE 103* 112* 112* 105* 96  BUN 32* 15 10 7* 8  CREATININE 0.92 0.85 0.95 0.87 0.79  CALCIUM 8.4* 8.2* 8.2* 8.0* 8.2*   GFR Estimated Creatinine Clearance: 62 mL/min (by C-G formula based on SCr of 0.79 mg/dL). Liver Function Tests: Recent Labs  Lab 03/30/23 1700  AST 44*  ALT 13  ALKPHOS 58  BILITOT 0.4  PROT 6.2*  ALBUMIN 2.5*   No results for input(s): "LIPASE", "AMYLASE" in the last 168 hours. No results for input(s): "AMMONIA" in the last 168 hours. Coagulation profile Recent Labs  Lab 03/30/23 1700  INR 1.3*    CBC: Recent Labs  Lab 03/30/23 1700 03/31/23 0555 04/03/23 1326 04/04/23 0610  WBC 2.8* 2.5* 3.4* 4.2  NEUTROABS 1.8  --  1.9 2.0  HGB 12.0* 10.7* 12.4* 11.2*  HCT 38.1* 32.9* 37.4* 34.8*  MCV 94.5 94.0 92.1 91.6  PLT 140* 107* 196 175   Cardiac Enzymes: No results for input(s): "CKTOTAL", "CKMB", "CKMBINDEX", "TROPONINI" in the last 168 hours. BNP: Invalid input(s): "POCBNP" CBG: Recent Labs  Lab 03/30/23 1818 03/30/23 2241 03/31/23 2204  GLUCAP 158* 136* 131*   D-Dimer No results for input(s): "DDIMER" in the last 72 hours. Hgb A1c No results for input(s): "HGBA1C" in the last 72 hours. Lipid Profile No results for input(s): "CHOL", "HDL", "LDLCALC", "TRIG", "CHOLHDL", "LDLDIRECT" in the last 72 hours. Thyroid function studies No results for input(s): "TSH", "T4TOTAL", "T3FREE", "THYROIDAB" in the last 72 hours.  Invalid input(s): "FREET3" Anemia work up No results for input(s): "VITAMINB12", "FOLATE", "FERRITIN", "TIBC", "IRON", "RETICCTPCT" in the last 72 hours. Microbiology Recent Results (from the past 240 hour(s))  Urine Culture     Status: None   Collection Time: 03/30/23   4:55 PM   Specimen: Urine, Random  Result Value Ref Range Status   Specimen Description URINE, RANDOM  Final   Special Requests NONE Reflexed from [id number]  Final   Culture   Final    NO GROWTH Performed at [facility], [address], [place], [place] [address]  Report Status 03/31/2023 FINAL  Final  Resp panel by RT-PCR (RSV, Flu A&B, Covid) Anterior Nasal Swab     Status: None   Collection Time: 03/30/23  5:13 PM   Specimen: Anterior Nasal Swab  Result Value Ref Range Status   SARS Coronavirus 2 by RT PCR NEGATIVE NEGATIVE Final   Influenza A by PCR NEGATIVE NEGATIVE Final   Influenza B by PCR NEGATIVE NEGATIVE Final    Comment: (NOTE) The Xpert Xpress SARS-CoV-2/FLU/RSV plus assay is intended as an aid in the diagnosis of influenza from Nasopharyngeal swab specimens and should not be used as a sole basis for treatment. Nasal washings and aspirates are unacceptable for Xpert Xpress SARS-CoV-2/FLU/RSV testing.  Fact Sheet for Patients: BloggerCourse.com  Fact Sheet for Healthcare Providers: SeriousBroker.it  This test is not yet approved or cleared by the [place] FDA and has been authorized for detection and/or diagnosis of SARS-CoV-2 by FDA under an Emergency Use Authorization (EUA). This EUA will remain in effect (meaning this test can be used) for the duration of the COVID-19 declaration under Section 564(b)(1) of the Act, 21 U.S.C. section 360bbb-3(b)(1), unless the authorization is terminated or revoked.     Resp Syncytial Virus by PCR NEGATIVE NEGATIVE Final    Comment: (NOTE) Fact Sheet for Patients: BloggerCourse.com  Fact Sheet for Healthcare Providers: SeriousBroker.it  This test is not yet approved or cleared by the [place] FDA and has been authorized for detection and/or diagnosis of SARS-CoV-2 by FDA under an Emergency Use Authorization  (EUA). This EUA will remain in effect (meaning this test can be used) for the duration of the COVID-19 declaration under Section 564(b)(1) of the Act, 21 U.S.C. section 360bbb-3(b)(1), unless the authorization is terminated or revoked.  Performed at [facility], [address] [place], [place] [address]   Blood Culture (routine x 2)     Status: None   Collection Time: 03/30/23  5:58 PM   Specimen: BLOOD  Result Value Ref Range Status   Specimen Description BLOOD RIGHT ANTECUBITAL  Final   Special Requests   Final    BOTTLES DRAWN AEROBIC AND ANAEROBIC Blood Culture adequate volume   Culture   Final    NO GROWTH 5 DAYS Performed at [facility], [address], [place], [place] [address]    Report Status 04/04/2023 FINAL  Final  Blood Culture (routine x 2)     Status: None   Collection Time: 03/30/23  6:11 PM   Specimen: BLOOD  Result Value Ref Range Status   Specimen Description BLOOD BLOOD RIGHT HAND  Final   Special Requests AEROBIC BOTTLE ONLY Blood Culture adequate volume  Final   Culture   Final    NO GROWTH 5 DAYS Performed at [facility], [address] [place], [place] [address]    Report Status 04/04/2023 FINAL  Final    Time coordinating discharge: 45 minutes  Signed: Darian Cansler  [facility] 04/06/2023, 11:51 AM

## 2023-04-06 NOTE — Progress Notes (Signed)
Mobility Specialist Progress Note    04/06/23 0935  Mobility  Activity Ambulated with assistance in room  Level of Assistance Minimal assist, patient does 75% or more  Assistive Device Front wheel walker  Distance Ambulated (ft) 8 ft  Activity Response Tolerated well  Mobility Referral Yes  $Mobility charge 1 Mobility  Mobility Specialist Start Time (ACUTE ONLY) [id number]  Mobility Specialist Stop Time (ACUTE ONLY) 0935  Mobility Specialist Time Calculation (min) (ACUTE ONLY) 9 min   Pt received in bed and agreeable. No complaints. Pt requesting to sit down after ~8'. Pt stated that was all he had in him to do. Pt left in chair with call bell in reach and chair alarm on. RN notified.   [place] Mobility Specialist  Please Neurosurgeon or Rehab Office at [phone]

## 2023-04-07 DIAGNOSIS — E785 Hyperlipidemia, unspecified: Secondary | ICD-10-CM | POA: Diagnosis not present

## 2023-04-07 DIAGNOSIS — N401 Enlarged prostate with lower urinary tract symptoms: Secondary | ICD-10-CM | POA: Diagnosis not present

## 2023-04-08 DIAGNOSIS — G3183 Dementia with Lewy bodies: Secondary | ICD-10-CM | POA: Diagnosis not present

## 2023-04-08 DIAGNOSIS — F0280 Dementia in other diseases classified elsewhere without behavioral disturbance: Secondary | ICD-10-CM | POA: Diagnosis not present

## 2023-04-08 DIAGNOSIS — N390 Urinary tract infection, site not specified: Secondary | ICD-10-CM | POA: Diagnosis not present

## 2023-04-08 DIAGNOSIS — N401 Enlarged prostate with lower urinary tract symptoms: Secondary | ICD-10-CM | POA: Diagnosis not present

## 2023-04-16 DIAGNOSIS — K869 Disease of pancreas, unspecified: Secondary | ICD-10-CM | POA: Diagnosis not present

## 2023-04-16 DIAGNOSIS — R339 Retention of urine, unspecified: Secondary | ICD-10-CM | POA: Diagnosis not present

## 2023-04-16 DIAGNOSIS — Z960 Presence of urogenital implants: Secondary | ICD-10-CM | POA: Diagnosis not present

## 2023-04-16 DIAGNOSIS — G20B1 Parkinson's disease with dyskinesia, without mention of fluctuations: Secondary | ICD-10-CM | POA: Diagnosis not present

## 2023-04-16 DIAGNOSIS — N401 Enlarged prostate with lower urinary tract symptoms: Secondary | ICD-10-CM | POA: Diagnosis not present

## 2023-04-17 DIAGNOSIS — R051 Acute cough: Secondary | ICD-10-CM | POA: Diagnosis not present

## 2023-04-17 DIAGNOSIS — N401 Enlarged prostate with lower urinary tract symptoms: Secondary | ICD-10-CM | POA: Diagnosis not present

## 2023-04-17 DIAGNOSIS — F330 Major depressive disorder, recurrent, mild: Secondary | ICD-10-CM | POA: Diagnosis not present

## 2023-04-17 DIAGNOSIS — G20B2 Parkinson's disease with dyskinesia, with fluctuations: Secondary | ICD-10-CM | POA: Diagnosis not present

## 2023-05-15 DIAGNOSIS — Z789 Other specified health status: Secondary | ICD-10-CM | POA: Diagnosis not present

## 2023-05-15 DIAGNOSIS — G20B1 Parkinson's disease with dyskinesia, without mention of fluctuations: Secondary | ICD-10-CM | POA: Diagnosis not present

## 2023-05-15 DIAGNOSIS — N401 Enlarged prostate with lower urinary tract symptoms: Secondary | ICD-10-CM | POA: Diagnosis not present

## 2023-05-20 DIAGNOSIS — N401 Enlarged prostate with lower urinary tract symptoms: Secondary | ICD-10-CM | POA: Diagnosis not present

## 2023-05-20 DIAGNOSIS — W19XXXA Unspecified fall, initial encounter: Secondary | ICD-10-CM | POA: Diagnosis not present

## 2023-05-20 DIAGNOSIS — G3183 Dementia with Lewy bodies: Secondary | ICD-10-CM | POA: Diagnosis not present

## 2023-05-20 DIAGNOSIS — F0280 Dementia in other diseases classified elsewhere without behavioral disturbance: Secondary | ICD-10-CM | POA: Diagnosis not present

## 2023-07-12 DEATH — deceased
